# Patient Record
Sex: Male | Born: 1965 | Race: White | Hispanic: No | Marital: Married | Smoking: Former smoker
Health system: Southern US, Community
[De-identification: ages and names within clinical notes are randomized; demographics above are authoritative.]

## PROBLEM LIST (undated history)

## (undated) DIAGNOSIS — I10 Essential (primary) hypertension: Secondary | ICD-10-CM

## (undated) DIAGNOSIS — L4050 Arthropathic psoriasis, unspecified: Secondary | ICD-10-CM

## (undated) DIAGNOSIS — C801 Malignant (primary) neoplasm, unspecified: Secondary | ICD-10-CM

## (undated) DIAGNOSIS — I219 Acute myocardial infarction, unspecified: Secondary | ICD-10-CM

## (undated) DIAGNOSIS — E785 Hyperlipidemia, unspecified: Secondary | ICD-10-CM

## (undated) DIAGNOSIS — K219 Gastro-esophageal reflux disease without esophagitis: Secondary | ICD-10-CM

## (undated) HISTORY — PX: LUMBAR FUSION: SHX111

## (undated) HISTORY — PX: CHOLECYSTECTOMY: SHX55

---

## 1998-11-15 ENCOUNTER — Inpatient Hospital Stay (HOSPITAL_COMMUNITY): Admission: EM | Admit: 1998-11-15 | Discharge: 1998-11-18 | Payer: Self-pay | Admitting: Neurosurgery

## 1998-11-15 ENCOUNTER — Encounter: Payer: Self-pay | Admitting: Neurosurgery

## 1998-11-16 ENCOUNTER — Encounter: Payer: Self-pay | Admitting: Neurosurgery

## 2001-01-31 HISTORY — PX: BACK SURGERY: SHX140

## 2003-06-30 ENCOUNTER — Ambulatory Visit (HOSPITAL_COMMUNITY): Admission: RE | Admit: 2003-06-30 | Discharge: 2003-06-30 | Payer: Self-pay | Admitting: Family Medicine

## 2003-07-21 ENCOUNTER — Ambulatory Visit (HOSPITAL_COMMUNITY): Admission: RE | Admit: 2003-07-21 | Discharge: 2003-07-21 | Payer: Self-pay | Admitting: Dermatology

## 2004-04-07 ENCOUNTER — Ambulatory Visit (HOSPITAL_COMMUNITY): Admission: RE | Admit: 2004-04-07 | Discharge: 2004-04-07 | Payer: Self-pay | Admitting: Family Medicine

## 2005-01-26 ENCOUNTER — Emergency Department (HOSPITAL_COMMUNITY): Admission: EM | Admit: 2005-01-26 | Discharge: 2005-01-26 | Payer: Self-pay | Admitting: Emergency Medicine

## 2006-03-10 ENCOUNTER — Ambulatory Visit (HOSPITAL_COMMUNITY): Admission: RE | Admit: 2006-03-10 | Discharge: 2006-03-10 | Payer: Self-pay | Admitting: Anesthesiology

## 2006-08-29 ENCOUNTER — Ambulatory Visit (HOSPITAL_COMMUNITY): Admission: RE | Admit: 2006-08-29 | Discharge: 2006-08-29 | Payer: Self-pay | Admitting: Family Medicine

## 2006-08-31 ENCOUNTER — Encounter (HOSPITAL_COMMUNITY): Admission: RE | Admit: 2006-08-31 | Discharge: 2006-09-30 | Payer: Self-pay | Admitting: Family Medicine

## 2006-10-27 ENCOUNTER — Ambulatory Visit (HOSPITAL_COMMUNITY): Admission: RE | Admit: 2006-10-27 | Discharge: 2006-10-27 | Payer: Self-pay | Admitting: Family Medicine

## 2006-11-07 ENCOUNTER — Ambulatory Visit (HOSPITAL_COMMUNITY): Admission: RE | Admit: 2006-11-07 | Discharge: 2006-11-07 | Payer: Self-pay | Admitting: Family Medicine

## 2007-04-05 ENCOUNTER — Ambulatory Visit (HOSPITAL_COMMUNITY): Admission: RE | Admit: 2007-04-05 | Discharge: 2007-04-05 | Payer: Self-pay | Admitting: Orthopaedic Surgery

## 2007-05-04 ENCOUNTER — Encounter: Admission: RE | Admit: 2007-05-04 | Discharge: 2007-05-04 | Payer: Self-pay | Admitting: Orthopaedic Surgery

## 2008-06-13 ENCOUNTER — Ambulatory Visit (HOSPITAL_COMMUNITY): Admission: RE | Admit: 2008-06-13 | Discharge: 2008-06-13 | Payer: Self-pay | Admitting: Family Medicine

## 2008-07-15 ENCOUNTER — Ambulatory Visit (HOSPITAL_COMMUNITY): Admission: RE | Admit: 2008-07-15 | Discharge: 2008-07-15 | Payer: Self-pay | Admitting: Family Medicine

## 2008-07-17 ENCOUNTER — Ambulatory Visit: Payer: Self-pay | Admitting: Gastroenterology

## 2008-07-17 ENCOUNTER — Encounter: Payer: Self-pay | Admitting: Gastroenterology

## 2008-07-17 ENCOUNTER — Ambulatory Visit (HOSPITAL_COMMUNITY): Admission: RE | Admit: 2008-07-17 | Discharge: 2008-07-17 | Payer: Self-pay | Admitting: Gastroenterology

## 2008-07-18 ENCOUNTER — Encounter: Payer: Self-pay | Admitting: Gastroenterology

## 2008-07-21 ENCOUNTER — Encounter: Payer: Self-pay | Admitting: Gastroenterology

## 2008-07-30 ENCOUNTER — Encounter (INDEPENDENT_AMBULATORY_CARE_PROVIDER_SITE_OTHER): Payer: Self-pay | Admitting: *Deleted

## 2008-10-07 ENCOUNTER — Ambulatory Visit (HOSPITAL_COMMUNITY): Admission: RE | Admit: 2008-10-07 | Discharge: 2008-10-07 | Payer: Self-pay | Admitting: Orthopaedic Surgery

## 2009-03-26 ENCOUNTER — Encounter: Payer: Self-pay | Admitting: Gastroenterology

## 2009-04-14 ENCOUNTER — Ambulatory Visit (HOSPITAL_COMMUNITY): Admission: RE | Admit: 2009-04-14 | Discharge: 2009-04-14 | Payer: Self-pay | Admitting: Family Medicine

## 2009-04-21 ENCOUNTER — Encounter (HOSPITAL_COMMUNITY): Admission: RE | Admit: 2009-04-21 | Discharge: 2009-05-21 | Payer: Self-pay | Admitting: Family Medicine

## 2009-05-27 ENCOUNTER — Ambulatory Visit: Payer: Self-pay | Admitting: Gastroenterology

## 2009-05-27 DIAGNOSIS — R1011 Right upper quadrant pain: Secondary | ICD-10-CM | POA: Insufficient documentation

## 2009-05-27 DIAGNOSIS — K219 Gastro-esophageal reflux disease without esophagitis: Secondary | ICD-10-CM

## 2009-08-12 ENCOUNTER — Encounter (INDEPENDENT_AMBULATORY_CARE_PROVIDER_SITE_OTHER): Payer: Self-pay

## 2010-03-04 NOTE — Assessment & Plan Note (Signed)
Summary: RUQ ABD PAIN, GERD   Visit Type:  Initial Consult Referring Provider:  Lilyan Punt Primary Care Provider:  Lilyan Punt, M.D.  Chief Complaint:  abd pain/ abn gb function.  History of Present Illness: Pt with NV RUQ abd pain after eating. Wakes with nausea and made worse 1 hour after eating. No vomiting. Sx: x5-6 mos. Sharp, and achy. Uncomfortable all tghe time but increases in intensity 1 hour after eating.No radiation. No BC, , Ibuprofen, Motrin, or Aleve, or Goodys. ASA every AM. Nexium 1-2x/day. Since Mar increased to two times a day and makes a difference sometimes. Before having RUQ pain, reflux controlled 100% of the time. Had grilled chicken, egg muffin for breakfast, ham and corn and peas. Drinks Diet caffeine free Mt Dew. FEELS PAIN EVERY DAY. No problems swallowing.  Preventive Screening-Counseling & Management  Alcohol-Tobacco     Smoking Status: current      Drug Use:  no.    Current Medications (verified): 1)  Prinzide 10-12.5 Mg Tabs (Lisinopril-Hydrochlorothiazide) .... Take 1 Tablet By Mouth Once A Day 2)  Pravachol 40 Mg Tabs (Pravastatin Sodium) .... Take 1 Tablet By Mouth Once A Day 3)  Niacin 500 Mg .... Take 1 Tablet By Mouth Once A Day 4)  Allopurinol 100 Mg Tabs (Allopurinol) .... Take 1 Tablet By Mouth Once A Day 5)  Nexium 40 Mg Cpdr (Esomeprazole Magnesium) .... Take 1 Tablet By Mouth Two Times A Day  Allergies (verified): 1)  ! Levaquin 2)  ! Augmentin  Past History:  Past Medical History: GERD Hyperlipidemia Hypertension Gout 2010 egd: benign gastric mucosa  Past Surgical History: R thumb Back surgery: 2 rods, 4 screws 2o to DDD  Family History: No FH of Colon Cancer or polyps  Social History: Occupation: Production designer, theatre/television/film for [employer] Patient currently smokes, 1pk/day. Married x 17 ys, 2 kids-youngest 12. Alcohol Use - no Illicit Drug Use - no Smoking Status:  current Drug Use:  no  Review of Systems       Per HPI otherwise  all systems negative.  Vital Signs:  Patient profile:   45 year old male Height:      73 inches Weight:      200 pounds BMI:     26.48 Temp:     98.3 degrees F oral Pulse rate:   64 / minute BP sitting:   138 / 88  (left arm) Cuff size:   regular  Vitals Entered By: Cloria Spring LPN (May 27, 2009 8:40 AM)  Physical Exam  General:  Well developed, well nourished, no acute distress. Head:  Normocephalic and atraumatic. Eyes:  PERRLA, no icterus. Mouth:  No deformity or lesions. Neck:  Supple; no masses. Lungs:  Clear throughout to auscultation. Heart:  Regular rate and rhythm; no murmurs. Abdomen:  Pos Carnett's sign with lifting arms and head off the bed. mod TTP in RUQ, without guarding, and without rebound.  normal bowel sounds, thin. Msk:  Symmetrical with no gross deformities. Normal posture. Extremities:  No edema or deformities noted. Neurologic:  Alert and  oriented x4;  grossly normal neurologically.  Impression & Recommendations:  Problem # 1:  ABDOMINAL PAIN, RIGHT UPPER QUADRANT (ICD-789.01) Assessment New  Most likely 2o to chronic cholecystitis. HIDA shows GB EF 17%. Refer to Gen Surg: Jenkins/Ziegler ASAP. OPV in 3 mos.  Orders: Est. Patient Level V ([address])  Problem # 2:  GERD (ICD-530.81) Assessment: Unchanged Sx fairly well controlled. Continue Nexium 1-2 times a day. Will reassesswith  next visit.  CC: PCP  Appended Document: RUQ ABD PAIN, GERD MAR 2011: NL HFP AMYLASE 64 LIPASE 33 HB 15.1 PLT 258 CR 1.02 ABD U/S-NO GALLSTONES  Appended Document: RUQ ABD PAIN, GERD reminder in computer

## 2010-03-04 NOTE — Letter (Signed)
Summary: Recall Office Visit  [facility]  [address]   [place], [place] [address]   Phone: [phone]  Fax: [phone]      August 12, 2009   MAVERIK FOOT [address] [place], [place]  [address] [birth year]   Dear Mr. HANNOLD,   According to our records, it is time for you to schedule a follow-up office visit with [place].   At your convenience, please call [phone] to schedule an office visit. If you have any questions, concerns, or feel that this letter is in error, we would appreciate your call.   Sincerely,    Hendricks Limes LPN  [facility] Ph: [phone]   Fax: [phone]  Appended Document: Recall Office Visit DR. STEVEN [id number]     LAP CHOLY W/ IOC

## 2010-03-04 NOTE — Letter (Signed)
Summary: SURGICAL REFERRAL  SURGICAL REFERRAL   Imported By: Ave Filter 05/27/2009 09:40:54  _____________________________________________________________________  External Attachment:    Type:   Image     Comment:   External Document

## 2010-03-04 NOTE — Letter (Signed)
Summary: single site laparoscopic cholecystectomy follow up  single site laparoscopic cholecystectomy follow up   Imported By: Curtis Sites 07/16/2009 10:56:41  _____________________________________________________________________  External Attachment:    Type:   Image     Comment:   External Document

## 2010-06-15 NOTE — Op Note (Signed)
NAME:  Brian Brewer, Brian Brewer                ACCOUNT NO.:  [id number]   MEDICAL RECORD NO.:  [id number]          PATIENT TYPE:  AMB   LOCATION:  DAY                           FACILITY:  [facility]   PHYSICIAN:  Kassie Mends, M.D.      DATE OF BIRTH:  13-Jul-1965   DATE OF PROCEDURE:  07/17/2008  DATE OF DISCHARGE:  07/17/2008                               OPERATIVE REPORT   REFERRING PHYSICIAN:  Scott A. Gerda Diss, MD   PROCEDURE:  Esophagogastroduodenoscopy with cold forceps biopsy.   INDICATION FOR EXAM:  Brian Brewer is a 45 year old smoker who presents  with new-onset dyspepsia.  He denies using aspirin, BC, Goody powders,  ibuprofen, Motrin, or Aleve.  He uses hydrocodone as needed for pain.  He also takes Nexium daily.  He denies any uncontrolled reflux, nausea,  vomiting, constipation, or diarrhea.  He has had no black stool or blood  in stool or hematemesis.  He has no problems with swallowing.  He has an  intentional 6-pound weight loss.   FINDINGS:  1. Normal esophagus without evidence of Barrett mass, erosion,      ulceration, or stricture.  2. Patchy erythema and edema in the body and the antrum with      occasional erosion.  Biopsies obtained via cold forceps to evaluate      for Helicobacter pylori gastritis.  3. Normal duodenal bulb and second portion of the duodenum.   DIAGNOSES:  Dyspepsia likely secondary to gastritis, biopsies pending.   RECOMMENDATIONS:  1. No aspirin or NSAIDs for 30 days.  No anticoagulation for 7 days.  2. Follow up in 1 month visit with Dr. Cira Servant.  Will reassess his      abdominal pain.  3. Will call him with the results of his biopsies.  4. He may resume his previous diet and avoid gastric irritants.  He is      given a handout on gastric irritants and gastritis.  5. If the patient does not have H. pylori gastritis, then he will need      a CT scan of the abdomen and pelvis to reevaluate his abdominal      pain.  The ultrasound did not provide an adequate  view of his      pancreas.   MEDICATIONS:  1. Demerol 100 mg IV.  2. Versed 4 mg IV.  3. Phenergan 25 mg IV.   PROCEDURE TECHNIQUE:  Physical exam was performed.  Informed consent was  obtained from the patient after explaining benefits, risks, and  alternatives to the procedure.  The patient was connected to the monitor  and placed in left lateral position.  Continuous oxygen was provided by  nasal cannula and IV medicine administered through an indwelling  cannula.  After administration of sedation, the patient's esophagus was  intubated, and the scope advanced under direct visualization to the  second portion of the duodenum.  Scope was removed slowly by carefully  examining the color, texture, anatomy, and integrity of the mucosa on  the way out.  The patient was recovered in endoscopy and discharged home  in satisfactory condition.   PATH:  [facility]. Needs CT ABD pancreatic protocol      Kassie Mends, M.D.  Electronically Signed     SM/MEDQ  D:  07/17/2008  T:  07/18/2008  Job:  [id number]   cc:   Lorin Picket A. Gerda Diss, MD  Fax: [phone]

## 2010-06-15 NOTE — Procedures (Signed)
NAME:  Brian Brewer, Brian Brewer NO.:  [id number]   MEDICAL RECORD NO.:  [id number]          PATIENT TYPE:  REC   LOCATION:                                FACILITY:  [facility]   PHYSICIAN:  Scott A. Gerda Diss, MD    DATE OF BIRTH:  1965/06/06   DATE OF PROCEDURE:  08/31/2006  DATE OF DISCHARGE:                                  STRESS TEST   TEST:  Stress Myoview.   INDICATION:  Chest pain, hypertension, smoker.   BRIEF HISTORY:  The patient has been having elevated blood pressure the  past few weeks along with some intermittent chest pain and discomfort in  substernal to upper epigastrium area. He is on Nexium for reflux and he  does not think it feels like reflux.   Resting EKG:  There is no acute ST segment changes noted.  Normal sinus  rhythm.   HEART RATE RESPONSE TO EXERCISE:  The patient's heart rate gradually  went up and hit in his target around about 9 to 10 minutes and we pushed  to 11 minutes 30 seconds with peak heart rate of 163.   ST SEGMENT RESPONSE TO EXERCISE:  The patient did not have any  appreciable ST segment depressions to exercise.   ARRHYTHMIAS:  None.   BLOOD PRESSURE RESPONSE TO EXERCISE:  Blood pressure went up moderately  during exercise. It went up significantly in the post exercise days.  The patient developed mild headache, but then as the blood pressure came  down his headache started going away.   RECOVERY PHASE:  Uneventful.   SYMPTOMATOLOGY:  No chest pressure or tightness during the exercise,  just a little bit of shortness of breath.   Await interpretation of the Myoview, but by and large this is felt to be  a negative test with low risk of cardiac disease.  She has been  counseled regarding the vast importance of getting his risk factors in  order.  We are awaiting his lab work which was recently done.  He knows  that he needs to quit smoking and he is to increase his Lisinopril to 10  mg a day. We will be following up the results  of this test in the near  future.      Scott A. Gerda Diss, MD  Electronically Signed     SAL/MEDQ  D:  08/31/2006  T:  08/31/2006  Job:  [id number]

## 2010-06-18 NOTE — Procedures (Signed)
NAME:  KADIEN, LINEMAN NO.:  [id number]   MEDICAL RECORD NO.:  [id number]          PATIENT TYPE:  OUT   LOCATION:  DFTL                          FACILITY:  [facility]   PHYSICIAN:  Scott A. Gerda Diss, MD    DATE OF BIRTH:  1965/06/19   DATE OF PROCEDURE:  04/07/2004  DATE OF DISCHARGE:                                    STRESS TEST   CHIEF COMPLAINT:  Chest discomfort.   PROTOCOL:  Bruce protocol.   RESTING EKG:  No acute ST segment changes are noted. No arrhythmias.   HEART RESPONSE TO EXERCISE:  The patient had a gradual increase in heart  rate, reached a peak of 165 in stage 4.   REASON FOR STOPPING TEST:  Achieved a heart rate goal as well as mild  dyspnea, but there was no chest discomfort.   ARRHYTHMIAS:  Had two episodes of single beat PVCs but no other problems.   ST SEGMENT RESPONSE TO EXERCISE:  There was no acute ST segment depressions  noted with exercise. The patient tolerated that well.   BLOOD PRESSURE RESPONSE:  The patient had a mild hypertensive response to  exercise and post exercise phase.   RECOVERY PHASE:  Uneventful.   INTERPRETATION:  Normal stress test with mild hypertensive response. The  patient was encouraged to embark on a regular exercise program as well he is  in the process of quitting smoking.      SAL/MEDQ  D:  04/07/2004  T:  04/07/2004  Job:  [id number]

## 2010-09-16 ENCOUNTER — Ambulatory Visit (HOSPITAL_COMMUNITY)
Admission: RE | Admit: 2010-09-16 | Discharge: 2010-09-16 | Disposition: A | Discharge status: Home / Self Care | Payer: Commercial Managed Care - PPO | Source: Ambulatory Visit | Attending: Podiatry | Admitting: Podiatry

## 2010-09-16 ENCOUNTER — Other Ambulatory Visit: Payer: Self-pay

## 2010-09-16 ENCOUNTER — Encounter (HOSPITAL_COMMUNITY)
Admission: RE | Admit: 2010-09-16 | Discharge: 2010-09-16 | Disposition: A | Discharge status: Home / Self Care | Payer: Commercial Managed Care - PPO | Source: Ambulatory Visit | Attending: Podiatry | Admitting: Podiatry

## 2010-09-16 ENCOUNTER — Encounter (HOSPITAL_COMMUNITY): Payer: Self-pay

## 2010-09-16 HISTORY — DX: Essential (primary) hypertension: I10

## 2010-09-16 HISTORY — DX: Hyperlipidemia, unspecified: E78.5

## 2010-09-16 HISTORY — DX: Gastro-esophageal reflux disease without esophagitis: K21.9

## 2010-09-16 LAB — CBC
MCV: 86.3 fL (ref 78.0–100.0)
Platelets: 223 10*3/uL (ref 150–400)
RBC: 5.25 MIL/uL (ref 4.22–5.81)
WBC: 10.6 10*3/uL — ABNORMAL HIGH (ref 4.0–10.5)

## 2010-09-16 LAB — BASIC METABOLIC PANEL
CO2: 24 mEq/L (ref 19–32)
Chloride: 101 mEq/L (ref 96–112)
Creatinine, Ser: 1 mg/dL (ref 0.50–1.35)
GFR calc Af Amer: >60 mL/min (ref >60)
Potassium: 3.9 mEq/L (ref 3.5–5.1)

## 2010-09-16 LAB — SURGICAL PCR SCREEN
MRSA, PCR: NEGATIVE
Staphylococcus aureus: NEGATIVE

## 2010-09-16 MED ORDER — CLINDAMYCIN PHOSPHATE 600 MG/50ML IV SOLN: 600.0000 mg | Freq: Once | INTRAVENOUS | Status: DC

## 2010-09-16 MED ORDER — LACTATED RINGERS IV SOLN: INTRAVENOUS | Status: DC

## 2010-09-16 NOTE — Patient Instructions (Addendum)
21 Brian Brewer  09/16/2010   Your procedure is scheduled on:  09-23-10  Report to [facility] at 700  AM.  Call this number if you have problems the morning of surgery: [phone]   Remember:   Do not eat food:After Midnight.  Do not drink clear liquids: After Midnight.  Take these medicines the morning of surgery with A SIP OF WATER: lisinopril   Do not wear jewelry, make-up or nail polish.  Do not wear lotions, powders, or perfumes. You may wear deodorant.  Do not shave 48 hours prior to surgery.  Do not bring valuables to the hospital.  Contacts, dentures or bridgework may not be worn into surgery.  Leave suitcase in the car. After surgery it may be brought to your room.  For patients admitted to the hospital, checkout time is 11:00 AM the day of discharge.   Patients discharged the day of surgery will not be allowed to drive home.  Name and phone number of your driver: family  Special Instructions: CHG Shower Use Special Wash: 1/2 bottle night before surgery and 1/2 bottle morning of surgery.   Please read over the following fact sheets that you were given: Pain Booklet, MRSA Information, Surgical Site Infection Prevention, Anesthesia Post-op Instructions and Care and Recovery After Surgery PATIENT INSTRUCTIONS POST-ANESTHESIA  IMMEDIATELY FOLLOWING SURGERY:  Do not drive or operate machinery for the first twenty four hours after surgery.  Do not make any important decisions for twenty four hours after surgery or while taking narcotic pain medications or sedatives.  If you develop intractable nausea and vomiting or a severe headache please notify your doctor immediately.  FOLLOW-UP:  Please make an appointment with your surgeon as instructed. You do not need to follow up with anesthesia unless specifically instructed to do so.  WOUND CARE INSTRUCTIONS (if applicable):  Keep a dry clean dressing on the anesthesia/puncture wound site if there is drainage.  Once the wound has quit  draining you may leave it open to air.  Generally you should leave the bandage intact for twenty four hours unless there is drainage.  If the epidural site drains for more than 36-48 hours please call the anesthesia department.  QUESTIONS?:  Please feel free to call your physician or the hospital operator if you have any questions, and they will be happy to assist you.     [facility] Anesthesia Department [address] [place] [phone]

## 2010-09-23 ENCOUNTER — Ambulatory Visit (HOSPITAL_COMMUNITY): Payer: Commercial Managed Care - PPO | Admitting: Anesthesiology

## 2010-09-23 ENCOUNTER — Ambulatory Visit (HOSPITAL_COMMUNITY)
Admission: RE | Admit: 2010-09-23 | Discharge: 2010-09-23 | Disposition: A | Discharge status: Home / Self Care | Payer: Commercial Managed Care - PPO | Source: Ambulatory Visit | Attending: Podiatry | Admitting: Podiatry

## 2010-09-23 ENCOUNTER — Encounter (HOSPITAL_COMMUNITY): Payer: Self-pay | Admitting: *Deleted

## 2010-09-23 ENCOUNTER — Ambulatory Visit (HOSPITAL_COMMUNITY): Payer: Commercial Managed Care - PPO

## 2010-09-23 ENCOUNTER — Encounter (HOSPITAL_COMMUNITY): Payer: Self-pay | Admitting: Anesthesiology

## 2010-09-23 ENCOUNTER — Encounter (HOSPITAL_COMMUNITY)
Admission: RE | Disposition: A | Discharge status: Home / Self Care | Payer: Self-pay | Source: Ambulatory Visit | Attending: Podiatry

## 2010-09-23 DIAGNOSIS — Z7982 Long term (current) use of aspirin: Secondary | ICD-10-CM | POA: Insufficient documentation

## 2010-09-23 DIAGNOSIS — K219 Gastro-esophageal reflux disease without esophagitis: Secondary | ICD-10-CM

## 2010-09-23 DIAGNOSIS — E785 Hyperlipidemia, unspecified: Secondary | ICD-10-CM | POA: Insufficient documentation

## 2010-09-23 DIAGNOSIS — I10 Essential (primary) hypertension: Secondary | ICD-10-CM | POA: Insufficient documentation

## 2010-09-23 DIAGNOSIS — Z79899 Other long term (current) drug therapy: Secondary | ICD-10-CM | POA: Insufficient documentation

## 2010-09-23 DIAGNOSIS — Z01812 Encounter for preprocedural laboratory examination: Secondary | ICD-10-CM | POA: Insufficient documentation

## 2010-09-23 DIAGNOSIS — L4050 Arthropathic psoriasis, unspecified: Secondary | ICD-10-CM

## 2010-09-23 DIAGNOSIS — Z01810 Encounter for preprocedural cardiovascular examination: Secondary | ICD-10-CM | POA: Insufficient documentation

## 2010-09-23 SURGERY — FUSION, PIP JOINT
Anesthesia: Monitor Anesthesia Care | Site: Foot | Laterality: Right | Wound class: Clean

## 2010-09-23 MED ORDER — HYDROXYZINE HCL 25 MG PO TABS
25.0000 mg | ORAL_TABLET | ORAL | Status: DC | PRN
  Filled 2010-09-23: qty 1

## 2010-09-23 MED ORDER — CLINDAMYCIN PHOSPHATE 300 MG/2ML IJ SOLN: 600.0000 mg | INTRAMUSCULAR | Status: DC

## 2010-09-23 MED ORDER — SODIUM CHLORIDE 0.9 % IR SOLN
Status: DC | PRN
  Administered 2010-09-23: 1000 mL

## 2010-09-23 MED ORDER — HYDROXYZINE PAMOATE 25 MG PO CAPS: 25.0000 mg | ORAL_CAPSULE | ORAL | Status: DC | PRN

## 2010-09-23 MED ORDER — LIDOCAINE HCL (PF) 2 % IJ SOLN
INTRAMUSCULAR | Status: DC | PRN
  Administered 2010-09-23: 5 mL

## 2010-09-23 MED ORDER — LACTATED RINGERS IV SOLN: INTRAVENOUS | Status: DC

## 2010-09-23 MED ORDER — CLINDAMYCIN PHOSPHATE 600 MG/50ML IV SOLN
600.0000 mg | Freq: Once | INTRAVENOUS | Status: AC
  Administered 2010-09-23: 600 mg via INTRAVENOUS

## 2010-09-23 MED ORDER — OXYCODONE-ACETAMINOPHEN 5-325 MG PO TABS: 1.0000 | ORAL_TABLET | ORAL | Status: AC | PRN

## 2010-09-23 MED ORDER — MIDAZOLAM HCL 2 MG/2ML IJ SOLN
1.0000 mg | INTRAMUSCULAR | Status: DC | PRN
  Administered 2010-09-23: 2 mg via INTRAVENOUS

## 2010-09-23 MED ORDER — BUPIVACAINE HCL 0.5 % IJ SOLN
INTRAMUSCULAR | Status: DC | PRN
  Administered 2010-09-23: 20 mL

## 2010-09-23 MED ORDER — OXYCODONE-ACETAMINOPHEN 5-325 MG PO TABS: 1.0000 | ORAL_TABLET | ORAL | Status: DC | PRN

## 2010-09-23 MED ORDER — HYDROXYZINE HCL 25 MG PO TABS: 25.0000 mg | ORAL_TABLET | ORAL | Status: AC | PRN

## 2010-09-23 MED ORDER — ONDANSETRON HCL 4 MG/2ML IJ SOLN
4.0000 mg | Freq: Once | INTRAMUSCULAR | Status: AC
  Administered 2010-09-23: 4 mg via INTRAVENOUS

## 2010-09-23 MED ORDER — PROPOFOL 10 MG/ML IV EMUL
INTRAVENOUS | Status: DC | PRN
  Administered 2010-09-23: 150 ug/kg/min via INTRAVENOUS
  Administered 2010-09-23: 25 ug/kg/min via INTRAVENOUS

## 2010-09-23 MED ORDER — MIDAZOLAM HCL 5 MG/5ML IJ SOLN
INTRAMUSCULAR | Status: DC | PRN
  Administered 2010-09-23 (×2): 1 mg via INTRAVENOUS

## 2010-09-23 MED ORDER — HYDROXYZINE PAMOATE 25 MG PO CAPS
25.0000 mg | ORAL_CAPSULE | ORAL | Status: DC | PRN
  Filled 2010-09-23: qty 1

## 2010-09-23 MED ORDER — LACTATED RINGERS IV SOLN
INTRAVENOUS | Status: DC
  Administered 2010-09-23: 08:00:00 via INTRAVENOUS
  Administered 2010-09-23: 50 mL/h via INTRAVENOUS

## 2010-09-23 MED ORDER — FENTANYL CITRATE 0.05 MG/ML IJ SOLN
INTRAMUSCULAR | Status: DC | PRN
  Administered 2010-09-23 (×2): 50 ug via INTRAVENOUS

## 2010-09-23 MED ORDER — LIDOCAINE HCL 1 % IJ SOLN
INTRAMUSCULAR | Status: DC | PRN
  Administered 2010-09-23: 10 mg via INTRADERMAL

## 2010-09-23 SURGICAL SUPPLY — 65 items
APL SKNCLS STERI-STRIP NONHPOA (GAUZE/BANDAGES/DRESSINGS) ×1
BAG HAMPER (MISCELLANEOUS) ×2 IMPLANT
BANDAGE ELASTIC 4 VELCRO NS (GAUZE/BANDAGES/DRESSINGS) ×2 IMPLANT
BANDAGE ELASTIC 6 VELCRO NS (GAUZE/BANDAGES/DRESSINGS) ×2 IMPLANT
BANDAGE ESMARK 4X12 BL STRL LF (DISPOSABLE) ×1 IMPLANT
BANDAGE GAUZE ELAST BULKY 4 IN (GAUZE/BANDAGES/DRESSINGS) ×2 IMPLANT
BENZOIN TINCTURE PRP APPL 2/3 (GAUZE/BANDAGES/DRESSINGS) ×2 IMPLANT
BIT DRILL 2.0 HCS 150 (BIT) ×1 IMPLANT
BLADE OSC/SAGITTAL MD 5.5X18 (BLADE) ×2 IMPLANT
BLADE SURG 15 STRL LF DISP TIS (BLADE) ×2 IMPLANT
BLADE SURG 15 STRL SS (BLADE) ×4
BNDG CMPR 12X4 ELC STRL LF (DISPOSABLE) ×1
BNDG ESMARK 4X12 BLUE STRL LF (DISPOSABLE) ×2
CAP PIN PROTECTOR ORTHO WHT (CAP) IMPLANT
CHLORAPREP W/TINT 26ML (MISCELLANEOUS) ×3 IMPLANT
CLOTH BEACON ORANGE TIMEOUT ST (SAFETY) ×2 IMPLANT
COVER LIGHT HANDLE STERIS (MISCELLANEOUS) ×4 IMPLANT
CUFF TOURNIQUET SINGLE 18IN (TOURNIQUET CUFF) ×1 IMPLANT
CUFF TOURNIQUET SINGLE 24IN (TOURNIQUET CUFF) ×1 IMPLANT
CUFF TOURNIQUET SINGLE 34IN LL (TOURNIQUET CUFF) ×1 IMPLANT
DECANTER SPIKE VIAL GLASS SM (MISCELLANEOUS) ×2 IMPLANT
DRAIN TLS ROUND 10FR (DRAIN) IMPLANT
DRAPE OEC MINIVIEW 54X84 (DRAPES) ×2 IMPLANT
DRILL BIT ALLOFIX 2.0MM (BIT) ×1 IMPLANT
DRSG ADAPTIC 3X8 NADH LF (GAUZE/BANDAGES/DRESSINGS) ×2 IMPLANT
DURA STEPPER LG (CAST SUPPLIES) IMPLANT
DURA STEPPER MED (CAST SUPPLIES) ×1 IMPLANT
DURA STEPPER SML (CAST SUPPLIES) IMPLANT
DURA STEPPER XL (SOFTGOODS) IMPLANT
ELECT REM PT RETURN 9FT ADLT (ELECTROSURGICAL) ×2
ELECTRODE REM PT RTRN 9FT ADLT (ELECTROSURGICAL) ×1 IMPLANT
FORMALIN 10 PREFIL 480ML (MISCELLANEOUS) ×1 IMPLANT
GLOVE BIO SURGEON STRL SZ7.5 (GLOVE) ×2 IMPLANT
GLOVE BIOGEL PI IND STRL 7.0 (GLOVE) IMPLANT
GLOVE BIOGEL PI INDICATOR 7.0 (GLOVE) ×2
GLOVE ECLIPSE 6.5 STRL STRAW (GLOVE) ×1 IMPLANT
GLOVE ECLIPSE 7.0 STRL STRAW (GLOVE) ×1 IMPLANT
GOWN BRE IMP SLV AUR XL STRL (GOWN DISPOSABLE) ×5 IMPLANT
GUIDEWIRE THREADED 1.1X150MM (WIRE) ×1 IMPLANT
K-WIRE 229MX1.6 (WIRE) ×1 IMPLANT
K-WIRE 6 (WIRE)
KIT ROOM TURNOVER AP CYSTO (KITS) ×2 IMPLANT
KWIRE 6 (WIRE) IMPLANT
MANIFOLD NEPTUNE II (INSTRUMENTS) ×2 IMPLANT
MANIFOLD NEPTUNE WASTE (CANNULA) ×2 IMPLANT
NDL HYPO 18GX1.5 BLUNT FILL (NEEDLE) IMPLANT
NDL HYPO 27GX1-1/4 (NEEDLE) ×1 IMPLANT
NEEDLE HYPO 18GX1.5 BLUNT FILL (NEEDLE) ×2 IMPLANT
NEEDLE HYPO 27GX1-1/4 (NEEDLE) ×6 IMPLANT
NS IRRIG 1000ML POUR BTL (IV SOLUTION) ×2 IMPLANT
PACK BASIC LIMB (CUSTOM PROCEDURE TRAY) ×2 IMPLANT
PAD ARMBOARD 7.5X6 YLW CONV (MISCELLANEOUS) ×2 IMPLANT
PIN CAPS ORTHO GREEN .062 (PIN) ×1 IMPLANT
RASP SM TEAR CROSS CUT (RASP) ×1 IMPLANT
SCREW HEADLESS SHT THD 3.0X34 (Screw) ×1 IMPLANT
SET BASIN LINEN APH (SET/KITS/TRAYS/PACK) ×2 IMPLANT
SPONGE GAUZE 4X4 12PLY (GAUZE/BANDAGES/DRESSINGS) ×2 IMPLANT
STRIP CLOSURE SKIN 1/2X4 (GAUZE/BANDAGES/DRESSINGS) ×2 IMPLANT
SUT MON AB 5-0 PS2 18 (SUTURE) ×3 IMPLANT
SUT VIC AB 3-0 SH 27 (SUTURE) ×2
SUT VIC AB 3-0 SH 27X BRD (SUTURE) IMPLANT
SUT VIC AB 4-0 PS2 27 (SUTURE) ×3 IMPLANT
SUT VICRYL AB 3-0 FS1 BRD 27IN (SUTURE) IMPLANT
SYR CONTROL 10ML LL (SYRINGE) ×6 IMPLANT
WATER STERILE IRR 1000ML POUR (IV SOLUTION) ×2 IMPLANT

## 2010-09-23 NOTE — Anesthesia Postprocedure Evaluation (Signed)
Anesthesia Post Note  Patient: Brian Brewer  Procedure(s) Performed:  PROXIMAL INTERPHALANGEAL FUSION (PIP) - Interphalangeal Joint Fusion Right Hallux  Anesthesia type: MAC  Patient location: PACU  Post pain: Pain level controlled  Post assessment: Post-op Vital signs reviewed, Patient's Cardiovascular Status Stable, Respiratory Function Stable, Patent Airway, No signs of Nausea or vomiting and Pain level controlled  Last Vitals:  Filed Vitals:   09/23/10 1055  BP: 128/72  Pulse:   Temp: 98 F (36.7 C)  Resp: 12    Post vital signs: Reviewed and stable  Level of consciousness: awake and alert   Complications: No apparent anesthesia complications

## 2010-09-23 NOTE — Anesthesia Procedure Notes (Addendum)
Procedure Name: MAC Date/Time: 09/23/2010 9:05 AM Performed by: Minerva Areola Pre-anesthesia Checklist: Patient identified, Patient being monitored, Emergency Drugs available, Timeout performed and Suction available Patient Re-evaluated:Patient Re-evaluated prior to inductionOxygen Delivery Method: Nasal Cannula and Simple face mask

## 2010-09-23 NOTE — Anesthesia Preprocedure Evaluation (Addendum)
Anesthesia Evaluation  Name, MR# and DOB Patient awake  General Assessment Comment  Reviewed: Allergy & Precautions, H&P , NPO status   History of Anesthesia Complications Negative for: history of anesthetic complications  Airway       Dental   Pulmonary      Cardiovascular hypertension, Pt. on medications     Neuro/Psych   GI/Hepatic/Renal        GERD Medicated and Controlled     Endo/Other    Abdominal   Musculoskeletal   Hematology   Peds  Reproductive/Obstetrics    Anesthesia Other Findings             Anesthesia Physical Anesthesia Plan  ASA: II  Anesthesia Plan: MAC   Post-op Pain Management:    Induction: Intravenous  Airway Management Planned: Nasal Cannula  Additional Equipment:   Intra-op Plan:   Post-operative Plan:   Informed Consent: I have reviewed the patients History and Physical, chart, labs and discussed the procedure including the risks, benefits and alternatives for the proposed anesthesia with the patient or authorized representative who has indicated his/her understanding and acceptance.     Plan Discussed with:   Anesthesia Plan Comments:         Anesthesia Quick Evaluation

## 2010-09-23 NOTE — H&P (Signed)
History and Physical Interval Note:   09/23/2010   8:22 AM   Brian Brewer  has presented today for surgery, with the diagnosis of psonatic arthritis, pain right hallux  The various methods of treatment have been discussed with the patient and family. After consideration of risks, benefits and other options for treatment, the patient has consented to  Procedure(s): INTERPHALANGEAL FUSION (IPJ) of the right hallux as a surgical intervention .  I have reviewed the patients' chart and labs.  Questions were answered to the patient's satisfaction.    There has been no interval change since his H&P was performed 09/15/2010.   Dallas Schimke  MD

## 2010-09-23 NOTE — Brief Op Note (Signed)
09/23/2010  10:49 AM  PATIENT:  Brian Brewer  45 y.o. male  PRE-OPERATIVE DIAGNOSIS:  Psoriatic Arthritis  POST-OPERATIVE DIAGNOSIS:  Psoriatic Arthritis  PROCEDURE:  INTERPHALANGEAL JOINT FUSION (IPJ) OF THE RIGHT HALLUX   SURGEON:  Marius Ditch, DPM  ANESTHESIA:   MAC  ESTIMATED BLOOD LOSS: Minimal  DRAINS: None   LOCAL MEDICATIONS USED:  MARCAINE 20 CC and XYLOCAINE 5 CC  SPECIMEN:  No Specimen  DISPOSITION OF SPECIMEN:  N/A  COUNTS:  YES  TOURNIQUET:  Pneumatic Ankle Tourniquet at 250 mmHg  PATIENT DISPOSITION:  Short Stay   Delay start of Pharmacological VTE agent (>24hrs) due to surgical blood loss or risk of bleeding:  not applicable

## 2010-09-23 NOTE — Transfer of Care (Signed)
Immediate Anesthesia Transfer of Care Note  Patient: Brian Brewer  Procedure(s) Performed:  PROXIMAL INTERPHALANGEAL FUSION (PIP) - Interphalangeal Joint Fusion Right Hallux  Patient Location: PACU  Anesthesia Type: MAC  Level of Consciousness: awake  Airway & Oxygen Therapy: Patient Spontanous Breathing. Nasal cannula  Post-op Assessment: Report given to PACU RN, Post -op Vital signs reviewed and stable and Patient moving all extremities  Post vital signs: Reviewed and stable  Complications: No apparent anesthesia complications

## 2010-09-25 NOTE — Op Note (Signed)
OPERATIVE REPORT  SURGEON:   Dallas Schimke, DPM  OR STAFF:   Lizabeth Leyden, RN - Circulator Sherri Sear - Scrub Person Raliegh Scarlet, RN - RN First Assistant Debra Magda Kiel, RN - Relief Circulator   PREOPERATIVE DIAGNOSIS:   Psoriatic arthritis  POSTOPERATIVE DIAGNOSIS: Psoriatic arthritis  PROCEDURE: IPJ fusion of the hallux of the right foot  ANESTHESIA:   Monitor Anesthesia Care   HEMOSTASIS:   Pneumatic Ankle Tourniquet set at 250 mmHg  ESTIMATED BLOOD LOSS:   Minimal  MATERIALS USED:   3.0 mm Synthes cannulated screw and 0.062" k-wire  PATHOLOGY:  None  COMPLICATIONS:  None  DESCRIPTION OF THE PROCEDURE:  The patient was brought to the operating room and placed on the operative table in the supine position.  A pneumatic ankle tourniquet was applied to the patient's ankle.  Following sedation, the surgical site was anesthetized with 0.5% Marcaine plain.  The foot was then prepped, scrubbed, and draped in the usual sterile technique.  The foot was elevated, exsanguinated and the pneumatic ankle tourniquet inflated to 250 mmHg.    Attention was directed to the dorsal aspect of the operative foot where a transverse incision was made overlying the interphalangeal joint of the hallux.  Dissection was continued deep down to the level of the interphalangeal joint of the hallux.  A transverse tenotomy and capsulotomy was performed.  Severe degenerative changes of the head of the proximal phalanx and base of the distal phalanx were noted.  Using a power bone saw the articular surface from the head of the proximal phalanx and base of the distal phalanx were resected and passed from the operative field.  The wound was irrigated with copious amounts of sterile irrigant.  A K wire from the Synthes 3.0 cannulated screw set was inserted through the distal phalanx and allowed to exit the distal aspect of the toe.  The wire was retrograded into the proximal phalanx.   Position of the wire was confirmed under C-arm fluoroscopy.  Following standard techniques, a 3.0 cannulated Synthes screw was inserted across the interphalangeal joint.  Adequate compression was noted.  A 0.062 inch Kirschner wire was then inserted from a dorsal medial to proximal lateral direction to serve as a second point of fixation.  Position of the wire was confirmed with C-arm fluoroscopy.  The surgical wound was then irrigated.  The extensor tendon was reapproximated using Vicryl.  The skin was reapproximated using Monocryl in a subcuticular manner.  The incision was reinforced with nylon and Steri-Strips.  A sterile compressive dressing was applied to the operative foot.  The patient's ankle tourniquet was deflated.  A prompt hyperemic response was noted to all digits of the operative foot.    The patient tolerated the procedure well.  The patient was then transferred to PACU under the care of the anesthesia team with vital signs stable and vascular status intact to all toes of the operative foot.  Following a period of postoperative monitoring, the patient will be discharged home.

## 2010-09-29 ENCOUNTER — Other Ambulatory Visit (HOSPITAL_COMMUNITY): Payer: Self-pay | Admitting: Podiatry

## 2010-11-11 LAB — POCT I-STAT CREATININE
Creatinine, Ser: 0.9
Operator id: 208401

## 2011-10-17 ENCOUNTER — Other Ambulatory Visit: Payer: Self-pay | Admitting: Family Medicine

## 2011-10-17 DIAGNOSIS — R109 Unspecified abdominal pain: Secondary | ICD-10-CM

## 2011-10-21 ENCOUNTER — Ambulatory Visit (HOSPITAL_COMMUNITY)
Admission: RE | Admit: 2011-10-21 | Discharge: 2011-10-21 | Disposition: A | Discharge status: Home / Self Care | Payer: Commercial Managed Care - PPO | Source: Ambulatory Visit | Attending: Family Medicine | Admitting: Family Medicine

## 2011-10-21 DIAGNOSIS — R109 Unspecified abdominal pain: Secondary | ICD-10-CM

## 2011-10-21 DIAGNOSIS — R1013 Epigastric pain: Secondary | ICD-10-CM | POA: Insufficient documentation

## 2012-03-08 ENCOUNTER — Emergency Department (HOSPITAL_COMMUNITY)
Admission: EM | Admit: 2012-03-08 | Discharge: 2012-03-08 | Disposition: A | Discharge status: Nursing Home (Includes ICF) | Payer: Commercial Managed Care - PPO | Source: Home / Self Care

## 2012-03-08 ENCOUNTER — Encounter (HOSPITAL_COMMUNITY): Payer: Self-pay | Admitting: Emergency Medicine

## 2012-03-08 ENCOUNTER — Emergency Department (HOSPITAL_COMMUNITY): Payer: Commercial Managed Care - PPO

## 2012-03-08 ENCOUNTER — Emergency Department (HOSPITAL_COMMUNITY)
Admission: EM | Admit: 2012-03-08 | Discharge: 2012-03-08 | Disposition: A | Discharge status: Home / Self Care | Payer: Commercial Managed Care - PPO | Attending: Emergency Medicine | Admitting: Emergency Medicine

## 2012-03-08 ENCOUNTER — Encounter (HOSPITAL_COMMUNITY): Payer: Self-pay | Admitting: *Deleted

## 2012-03-08 DIAGNOSIS — K219 Gastro-esophageal reflux disease without esophagitis: Secondary | ICD-10-CM | POA: Insufficient documentation

## 2012-03-08 DIAGNOSIS — R079 Chest pain, unspecified: Secondary | ICD-10-CM | POA: Insufficient documentation

## 2012-03-08 DIAGNOSIS — E785 Hyperlipidemia, unspecified: Secondary | ICD-10-CM | POA: Insufficient documentation

## 2012-03-08 DIAGNOSIS — Z720 Tobacco use: Secondary | ICD-10-CM

## 2012-03-08 DIAGNOSIS — Z7982 Long term (current) use of aspirin: Secondary | ICD-10-CM | POA: Insufficient documentation

## 2012-03-08 DIAGNOSIS — F17200 Nicotine dependence, unspecified, uncomplicated: Secondary | ICD-10-CM

## 2012-03-08 DIAGNOSIS — Z79899 Other long term (current) drug therapy: Secondary | ICD-10-CM | POA: Insufficient documentation

## 2012-03-08 DIAGNOSIS — M109 Gout, unspecified: Secondary | ICD-10-CM | POA: Insufficient documentation

## 2012-03-08 DIAGNOSIS — I10 Essential (primary) hypertension: Secondary | ICD-10-CM | POA: Insufficient documentation

## 2012-03-08 LAB — CBC
HCT: 43 % (ref 39.0–52.0)
Hemoglobin: 14.6 g/dL (ref 13.0–17.0)
MCH: 28.8 pg (ref 26.0–34.0)
MCHC: 34 g/dL (ref 30.0–36.0)

## 2012-03-08 LAB — PROTIME-INR: Prothrombin Time: 12 seconds (ref 11.6–15.2)

## 2012-03-08 LAB — COMPREHENSIVE METABOLIC PANEL
Alkaline Phosphatase: 78 U/L (ref 39–117)
BUN: 10 mg/dL (ref 6–23)
CO2: 25 mEq/L (ref 19–32)
Calcium: 9.3 mg/dL (ref 8.4–10.5)
GFR calc Af Amer: >90 mL/min (ref >90)
GFR calc non Af Amer: >90 mL/min (ref >90)
Glucose, Bld: 103 mg/dL — ABNORMAL HIGH (ref 70–99)
Total Protein: 6.6 g/dL (ref 6.0–8.3)

## 2012-03-08 LAB — APTT: aPTT: 39 seconds — ABNORMAL HIGH (ref 24–37)

## 2012-03-08 LAB — POCT I-STAT TROPONIN I: Troponin i, poc: 0.01 ng/mL (ref 0.00–0.08)

## 2012-03-08 MED ORDER — SODIUM CHLORIDE 0.9 % IV SOLN: 1000.0000 mL | INTRAVENOUS | Status: DC

## 2012-03-08 MED ORDER — NITROGLYCERIN 0.4 MG SL SUBL
SUBLINGUAL_TABLET | SUBLINGUAL | Status: AC
  Filled 2012-03-08: qty 25

## 2012-03-08 MED ORDER — SODIUM CHLORIDE 0.9 % IV SOLN
Freq: Once | INTRAVENOUS | Status: AC
  Administered 2012-03-08: 17:00:00 via INTRAVENOUS

## 2012-03-08 MED ORDER — ACETAMINOPHEN 325 MG PO TABS
650.0000 mg | ORAL_TABLET | Freq: Once | ORAL | Status: AC
  Administered 2012-03-08: 650 mg via ORAL
  Filled 2012-03-08: qty 2

## 2012-03-08 MED ORDER — NITROGLYCERIN 0.4 MG SL SUBL
0.4000 mg | SUBLINGUAL_TABLET | SUBLINGUAL | Status: DC | PRN
  Administered 2012-03-08 (×2): 0.4 mg via SUBLINGUAL

## 2012-03-08 MED ORDER — SODIUM CHLORIDE 0.9 % IV SOLN: Freq: Once | INTRAVENOUS | Status: DC

## 2012-03-08 MED ORDER — NITROGLYCERIN 2 % TD OINT
1.0000 [in_us] | TOPICAL_OINTMENT | Freq: Once | TRANSDERMAL | Status: AC
  Administered 2012-03-08: 1 [in_us] via TOPICAL
  Filled 2012-03-08: qty 1

## 2012-03-08 NOTE — ED Notes (Signed)
Pt tx from [facility] for CP. PMH of HTN. Pt family reports pt has had CP in past and had stress test that was negative. Pt given SL nitro x3 prior to ED arrival and ASA 81mg  x2. CP started today around 1100 and is described as constant pressure in left chest.

## 2012-03-08 NOTE — ED Provider Notes (Signed)
History     CSN: [id number]  Arrival date & time 03/08/12  1612   First MD Initiated Contact with Patient 03/08/12 1639      Chief Complaint  Patient presents with  . Chest Pain    (Consider location/radiation/quality/duration/timing/severity/associated sxs/prior treatment) HPI Comments: 47 year old male who stated he developed left anterior chest pressure and tightness approximately 10:00 this morning. It occurs at rest and has been constant through the time in which she presents to the urgent care. Since arrival the discomfort has increased. He denies diaphoresis, shortness of breath or GI symptoms. He states he took his blood pressure home this morning it was 191/130. He has a known history of hypertension treated with lisinopril. Risk Factors include uncontrolled hypertension, tobacco abuse and dyslipidemia.  Patient is a 47 y.o. male presenting with chest pain.  Chest Pain Pertinent negatives for primary symptoms include no fever, no fatigue and no palpitations.  Pertinent negatives for associated symptoms include no diaphoresis.     Past Medical History  Diagnosis Date  . Hypertension   . GERD (gastroesophageal reflux disease)   . Hyperlipemia   . Gout     Past Surgical History  Procedure Date  . Lumbar fusion     3 yrs ago-[place]  . Cholecystectomy     Family History  Problem Relation Age of Onset  . Anesthesia problems Neg Hx   . Hypotension Neg Hx   . Malignant hyperthermia Neg Hx   . Pseudochol deficiency Neg Hx     History  Substance Use Topics  . Smoking status: Current Every Day Smoker -- 1.0 packs/day for 20 years    Types: Cigarettes  . Smokeless tobacco: Not on file  . Alcohol Use: No      Review of Systems  Constitutional: Positive for activity change. Negative for fever, diaphoresis and fatigue.  HENT: Negative.   Respiratory: Negative.   Cardiovascular: Positive for chest pain. Negative for palpitations and leg swelling.   Gastrointestinal: Negative.   Genitourinary: Negative.   Musculoskeletal: Negative.   Skin: Negative.   Neurological: Negative.   Psychiatric/Behavioral: Negative.     Allergies  Amoxicillin-pot clavulanate and Levofloxacin  Home Medications   Current Outpatient Rx  Name  Route  Sig  Dispense  Refill  . ALLOPURINOL PO   Oral   Take by mouth.         Marland Kitchen NIACIN PO   Oral   Take by mouth.         . ASPIRIN EC 81 MG PO TBEC   Oral   Take 81 mg by mouth daily.           Marland Kitchen ESOMEPRAZOLE MAGNESIUM 20 MG PO PACK   Oral   Take 20 mg by mouth daily before breakfast.           . LISINOPRIL-HYDROCHLOROTHIAZIDE 10-12.5 MG PO TABS   Oral   Take 1 tablet by mouth daily.           Marland Kitchen PRAVASTATIN SODIUM 40 MG PO TABS   Oral   Take 40 mg by mouth daily.             BP 150/93  Pulse 80  Temp 98.1 F (36.7 C) (Oral)  Resp 16  SpO2 97%  Physical Exam  Constitutional: He is oriented to person, place, and time. He appears well-developed and well-nourished. No distress.  Eyes: Conjunctivae normal and EOM are normal.  Neck: Normal range of motion. Neck supple.  Cardiovascular:  Normal rate, regular rhythm, normal heart sounds and intact distal pulses.   Pulmonary/Chest: Effort normal and breath sounds normal. No respiratory distress. He has no wheezes. He has no rales.  Abdominal: Soft. There is no tenderness.  Musculoskeletal: He exhibits no edema and no tenderness.  Neurological: He is alert and oriented to person, place, and time. He exhibits normal muscle tone.  Skin: Skin is warm and dry.       Mild facial flushing  Psychiatric: He has a normal mood and affect.    ED Course  Procedures (including critical care time)  Labs Reviewed - No data to display No results found.   1. Chest pain   2. HTN (hypertension)   3. Tobacco abuse       MDM  Patient with history of uncontrolled hypertension presents with heaviness and tightness left anterior chest. Began  about 10 AM and states it is worse now. Additional risk factor is smoking and dyslipidemia. He states his blood pressures at home today was 191/130. EKG: Normal sinus rhythm with less than 1 mm ST elevation in II, III, aVF. Early \\re  pole in V2 and V3. Patient is being transferred for evaluation of chest pain.        Hayden Rasmussen, NP 03/08/12 [phone]

## 2012-03-08 NOTE — ED Provider Notes (Signed)
History    CSN: [id number] Arrival date & time 03/08/12  1740First MD Initiated Contact with Patient 03/08/12 1742      Chief Complaint  Patient presents with  . Chest Pain   Patient is a 47 y.o. male presenting with chest pain. The history is provided by the patient.  Chest Pain The chest pain began 6 - 12 hours ago. Duration of episode(s) is 7 hours. Chest pain occurs constantly. The chest pain is improving. The pain is currently at 1/10. The severity of the pain is moderate. The quality of the pain is described as pressure-like and tightness. The pain does not radiate. Pertinent negatives for primary symptoms include no fever, no fatigue, no syncope, no shortness of breath, no cough, no wheezing, no nausea and no vomiting. He tried nitroglycerin for the symptoms. Risk factors include smoking/tobacco exposure.    patient also has history of hypertension and hyperlipidemia. He was seen at the urgent care this afternoon. He performed an EKG and gave him nitroglycerin. Patient feels like that helped his symptoms and is feeling better than he did earlier. He has no known history of heart disease. There is no significant family of heart disease in immediate family but his grand parents did have heart trouble.  Past Medical History  Diagnosis Date  . Hypertension   . GERD (gastroesophageal reflux disease)   . Hyperlipemia   . Gout     Past Surgical History  Procedure Date  . Lumbar fusion     3 yrs ago-[place]  . Cholecystectomy     Family History  Problem Relation Age of Onset  . Anesthesia problems Neg Hx   . Hypotension Neg Hx   . Malignant hyperthermia Neg Hx   . Pseudochol deficiency Neg Hx     History  Substance Use Topics  . Smoking status: Current Every Day Smoker -- 1.0 packs/day for 20 years    Types: Cigarettes  . Smokeless tobacco: Not on file  . Alcohol Use: No      Review of Systems  Constitutional: Negative for fever and fatigue.  Respiratory: Negative for  cough, shortness of breath and wheezing.   Cardiovascular: Positive for chest pain. Negative for syncope.  Gastrointestinal: Negative for nausea and vomiting.  All other systems reviewed and are negative.    Allergies  Amoxicillin-pot clavulanate and Levofloxacin  Home Medications   Current Outpatient Rx  Name  Route  Sig  Dispense  Refill  . ALLOPURINOL PO   Oral   Take by mouth.         . ASPIRIN EC 81 MG PO TBEC   Oral   Take 81 mg by mouth daily.           Marland Kitchen ESOMEPRAZOLE MAGNESIUM 20 MG PO PACK   Oral   Take 20 mg by mouth daily before breakfast.           . LISINOPRIL-HYDROCHLOROTHIAZIDE 10-12.5 MG PO TABS   Oral   Take 1 tablet by mouth daily.           Marland Kitchen NIACIN PO   Oral   Take by mouth.         Marland Kitchen PRAVASTATIN SODIUM 40 MG PO TABS   Oral   Take 40 mg by mouth daily.             BP 134/76  Pulse 79  Temp 98.1 F (36.7 C) (Oral)  Resp 22  Ht 5\' 10"  (1.778 m)  Wt 210 lb (  95.255 kg)  BMI 30.13 kg/m2  SpO2 96%  Physical Exam  Nursing note and vitals reviewed. Constitutional: He appears well-developed and well-nourished. No distress.  HENT:  Head: Normocephalic and atraumatic.  Right Ear: External ear normal.  Left Ear: External ear normal.  Eyes: Conjunctivae normal are normal. Right eye exhibits no discharge. Left eye exhibits no discharge. No scleral icterus.  Neck: Neck supple. No tracheal deviation present.  Cardiovascular: Normal rate, regular rhythm and intact distal pulses.   Pulmonary/Chest: Effort normal and breath sounds normal. No stridor. No respiratory distress. He has no wheezes. He has no rales.  Abdominal: Soft. Bowel sounds are normal. He exhibits no distension. There is no tenderness. There is no rebound and no guarding.  Musculoskeletal: He exhibits no edema and no tenderness.  Neurological: He is alert. He has normal strength. No sensory deficit. Cranial nerve deficit:  no gross defecits noted. He exhibits normal muscle  tone. He displays no seizure activity. Coordination normal.  Skin: Skin is warm and dry. No rash noted.  Psychiatric: He has a normal mood and affect.    ED Course  Procedures (including critical care time) EKG  Rate: 70  Rhythm: normal sinus rhythm  QRS Axis: normal  Intervals: normal  ST/T Wave abnormalities: normal  Conduction Disutrbances:none  Narrative Interpretation: Q waves in lead 3 and ED F.  Old EKG Reviewed: Q wave is larger when compared to EKG dated 09/16/2010  Labs Reviewed  COMPREHENSIVE METABOLIC PANEL - Abnormal; Notable for the following:    Glucose, Bld 103 (*)     Total Bilirubin 0.2 (*)     All other components within normal limits  APTT - Abnormal; Notable for the following:    aPTT 39 (*)     All other components within normal limits  CBC  PROTIME-INR  POCT I-STAT TROPONIN I  POCT I-STAT TROPONIN I   Dg Chest Portable 1 View  03/08/2012  *RADIOLOGY REPORT*  Clinical Data: Chest pain  PORTABLE CHEST - 1 VIEW  Comparison: 06/13/2008  Findings: Lungs are clear. No pleural effusion or pneumothorax.  The heart is top normal in size.  IMPRESSION: No evidence of acute cardiopulmonary disease.   Original Report Authenticated By: Charline Bills, M.D.      MDM  The patient has cardiac risk factors but no known history of coronary artery disease. His evaluation in the emergency room is unremarkable.    I discussed admission and further evaluation the hospital with the patient. The patient states he does not want to be admitted and wants to be discharged.  He states he has had this chest pain before in the past had a stress test that was normal. He never has seen a cardiologist and have this test performed as an outpatient by his primary Dr.  I discussed the case with our cardiology. They will arrange close outpatient followup with the patient. He also should call him tomorrow. Patient was emergency room for two sets of cardiac enzymes. He has had constant chest  pain at this time for greater than 10 hours. Both sets of enzymes are negative. At this point I think he is low risk and it is reasonable for him to followup closely as an outpatient. Patient is instructed to take an aspirin daily. He is to return to emergency room for any worsening symptoms or other concerns.        Celene Kras, MD 03/08/12 2150

## 2012-03-08 NOTE — ED Notes (Signed)
Head ache better after tylenol

## 2012-03-08 NOTE — ED Notes (Signed)
Iv  Ns  tko  20  Angio  l  Hand 1  Att  Site  Is  Patent

## 2012-03-08 NOTE — ED Provider Notes (Signed)
Medical screening examination/treatment/procedure(s) were performed by non-physician practitioner and as supervising physician I was immediately available for consultation/collaboration.  Leslee Home, M.D.   Reuben Likes, MD 03/08/12 [phone]

## 2012-03-08 NOTE — ED Notes (Signed)
Pt  Reports   A   Pain  Scale   Of  4     l  Sided        Described  As   Someone  Pushing  On  His  Chest             Pain  Started    About 7  Hours  Ago           denys  Any    Shortness  Of  Breath   Also  Reports  Feeling  Tired

## 2012-03-08 NOTE — ED Notes (Signed)
Placed  On  Cardiac  Monitor  Nasal o2  At  2  l / min 

## 2012-03-12 ENCOUNTER — Ambulatory Visit (HOSPITAL_COMMUNITY)
Admission: RE | Admit: 2012-03-12 | Discharge: 2012-03-12 | Disposition: A | Discharge status: Home / Self Care | Payer: Commercial Managed Care - PPO | Source: Ambulatory Visit | Attending: Family Medicine | Admitting: Family Medicine

## 2012-03-12 ENCOUNTER — Other Ambulatory Visit (HOSPITAL_COMMUNITY): Payer: Self-pay | Admitting: Family Medicine

## 2012-03-12 DIAGNOSIS — R079 Chest pain, unspecified: Secondary | ICD-10-CM

## 2012-03-16 ENCOUNTER — Encounter: Payer: Commercial Managed Care - PPO | Admitting: Cardiology

## 2012-03-22 ENCOUNTER — Encounter: Payer: Commercial Managed Care - PPO | Admitting: Cardiovascular Disease

## 2012-03-23 ENCOUNTER — Ambulatory Visit (INDEPENDENT_AMBULATORY_CARE_PROVIDER_SITE_OTHER): Payer: Commercial Managed Care - PPO | Admitting: Internal Medicine

## 2012-03-23 ENCOUNTER — Encounter: Payer: Self-pay | Admitting: Internal Medicine

## 2012-03-23 ENCOUNTER — Ambulatory Visit: Payer: Commercial Managed Care - PPO | Admitting: Internal Medicine

## 2012-03-23 VITALS — BP 143/85 | HR 83 | Ht 70.0 in | Wt 204.0 lb

## 2012-03-23 DIAGNOSIS — R079 Chest pain, unspecified: Secondary | ICD-10-CM

## 2012-03-23 NOTE — Patient Instructions (Addendum)
Your physician recommends that you schedule a follow-up appointment in: To be determined following testing  Your physician has requested that you have a stress echocardiogram. For further information please visit [email or web address] Please follow instruction sheet as given.

## 2012-03-23 NOTE — Progress Notes (Signed)
HPI Patient is a 47 yo who was referred fro evaluation of CP Seen in ER on 2/6.  CP at that time lasted over 8 hours. Started pressure that got more intense  BP was 180/140   Discomfort Constant but  impoved while in ER  Admit was recomm but he refused.  2 cardiac markers were negative.  Since ER visit hs had no further  chest pressure  patient also has history of hypertension and hyperlipidemia.   Patient was seen by Lilyan Punt on 2/10  History of increased BP.  Had PAF 2000, tobacco [place], GERD, HL.   At clinic visit started on Prinizide.  10/12.5 Had been on Prinivil for several years.  Continue ASA< pravachol.    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  Went back to bed  No further spells. No edema.  No presyncope  On lisinopril fro a couple year.s  Usually good control until 2 wks ago.  28 yr tobacco.  1ppd  Trying to quit.  Using blue cig.  Down to 10 per day.  Has  Had some wheezing in past  None recent  Dad has CAD, CV disease Allergies  Allergen Reactions  . Amoxicillin-Pot Clavulanate Other (See Comments)    Cramps and diarrhea  . Levofloxacin Other (See Comments)    Passed out (possibly due to medication)    Current Outpatient Prescriptions  Medication Sig Dispense Refill  . allopurinol (ZYLOPRIM) 100 MG tablet Take 100 mg by mouth daily.      Marland Kitchen aspirin EC 81 MG tablet Take 81 mg by mouth daily.        Marland Kitchen esomeprazole (NEXIUM) 40 MG capsule Take 40 mg by mouth daily before breakfast.      . lisinopril-hydrochlorothiazide (PRINZIDE,ZESTORETIC) 10-12.5 MG per tablet Take 1 tablet by mouth daily.        Marland Kitchen NIACIN PO Take 1 tablet by mouth daily.       . pravastatin (PRAVACHOL) 40 MG tablet Take 40 mg by mouth every evening.         No current facility-administered medications for this visit.    Past Medical History  Diagnosis Date  . Hypertension   . GERD (gastroesophageal reflux disease)   . Hyperlipemia   . Gout     Past Surgical History  Procedure Laterality Date  . Lumbar fusion      3 yrs ago-[place]  . Cholecystectomy      Family History  Problem Relation Age of Onset  . Anesthesia problems Neg Hx   . Hypotension Neg Hx   . Malignant hyperthermia Neg Hx   . Pseudochol deficiency Neg Hx     History   Social History  . Marital Status: Married    Spouse Name: N/A    Number of Children: N/A  . Years of Education: N/A   Occupational History  . Not on file.   Social History Main Topics  . Smoking status: Current Every Day Smoker -- 1.00 packs/day for 20 years    Types: Cigarettes  . Smokeless tobacco: Not on file  . Alcohol Use: No  . Drug Use: No  . Sexually Active: Yes  Birth Control/ Protection: None   Other Topics Concern  . Not on file   Social History Narrative  . No narrative on file    Review of Systems:  All systems reviewed.  They are negative to the above problem except as previously stated.  Vital Signs: BP 143/85  Pulse 83  Ht 5\' 10"  (1.778 m)  Wt 204 lb (92.534 kg)  BMI 29.27 kg/m2  Physical Exam Patient is in NAD HEENT:  Normocephalic, atraumatic. EOMI, PERRLA.  Neck: JVP is normal.  No bruits.  Lungs: clear to auscultation. No rales no wheezes.  Heart: Regular rate and rhythm. Normal S1, S2. No S3.   No significant murmurs. PMI not displaced.  Abdomen:  Supple, nontender. Normal bowel sounds. No masses. No hepatomegaly.  Extremities:   Good distal pulses throughout. No lower extremity edema.  Musculoskeletal :moving all extremities.  Neuro:   alert and oriented x3.  CN II-XII grossly intact.   03/10/12  EKG  SR 70  Poss IWMI  Assessment and Plan:  1.  CP  Atypical but patient has risks.  Will review CT scan for atherosclerosis.  Will set up for  stress echo  2.  HTN  Follow on prinizide.  Take day of test  3.  Lipids  Will get labs  4.  TOb  Congratulated on effort to quit.  Continue.

## 2012-03-30 ENCOUNTER — Ambulatory Visit (HOSPITAL_COMMUNITY)
Admission: RE | Admit: 2012-03-30 | Discharge: 2012-03-30 | Disposition: A | Discharge status: Home / Self Care | Payer: Commercial Managed Care - PPO | Source: Ambulatory Visit | Attending: Internal Medicine | Admitting: Internal Medicine

## 2012-03-30 DIAGNOSIS — R079 Chest pain, unspecified: Secondary | ICD-10-CM | POA: Insufficient documentation

## 2012-03-30 DIAGNOSIS — F17200 Nicotine dependence, unspecified, uncomplicated: Secondary | ICD-10-CM | POA: Insufficient documentation

## 2012-03-30 DIAGNOSIS — R072 Precordial pain: Secondary | ICD-10-CM

## 2012-03-30 NOTE — Progress Notes (Signed)
Stress Lab Nurses Notes - Brian Brewer  Brian Brewer 03/30/2012 Reason for doing test: Chest Pain and Fatigue Type of test: Stress Echo Nurse performing test: Parke Poisson, RN Nuclear Medicine Tech: Not Applicable Echo Tech: Karrie Doffing MD performing test: R. Rothbart Family MD: Lilyan Punt Test explained and consent signed: yes IV started: No IV started Symptoms: fatigue in legs Treatment/Intervention: None Reason test stopped: fatigue and reached target HR After recovery IV was: none Patient to return to Nuc. Med at :NA Patient discharged: Home Patient's Condition upon discharge was: stable & HR  Comments: During test peak BP 199/77 & HR 151.  Recovery BP 142/81 & HR 93.  Symptoms resolved in recovery.  Erskine Speed T

## 2012-03-30 NOTE — Progress Notes (Signed)
*  PRELIMINARY RESULTS* Echocardiogram Echocardiogram Stress Test has been performed.  Conrad National 03/30/2012, 10:21 AM

## 2012-05-21 ENCOUNTER — Ambulatory Visit (INDEPENDENT_AMBULATORY_CARE_PROVIDER_SITE_OTHER): Payer: Commercial Managed Care - PPO | Admitting: Family Medicine

## 2012-05-21 ENCOUNTER — Telehealth: Payer: Self-pay | Admitting: Family Medicine

## 2012-05-21 ENCOUNTER — Encounter: Payer: Self-pay | Admitting: Family Medicine

## 2012-05-21 VITALS — BP 130/88 | Temp 98.6°F | Wt 206.0 lb

## 2012-05-21 DIAGNOSIS — J019 Acute sinusitis, unspecified: Secondary | ICD-10-CM

## 2012-05-21 DIAGNOSIS — J309 Allergic rhinitis, unspecified: Secondary | ICD-10-CM

## 2012-05-21 MED ORDER — CEFPROZIL 500 MG PO TABS: 500.0000 mg | ORAL_TABLET | Freq: Two times a day (BID) | ORAL | Status: DC

## 2012-05-21 NOTE — Telephone Encounter (Signed)
error 

## 2012-05-21 NOTE — Patient Instructions (Signed)
Brian Brewer Colon Health - one per day  Work on quitting smoking  Store brand Allegra 180 mg one daily, if persistant allergy symptoms then call us for flonase prescription

## 2012-05-21 NOTE — Progress Notes (Signed)
  Subjective:    Patient ID: Brian Brewer, male    DOB: Jun 27, 1965, 47 y.o.   MRN: [id number]  Sinusitis This is a new problem. The current episode started in the past 7 days. Maximum temperature: pt could tell he was running fever. The fever has been present for 1 to 2 days. Associated symptoms include chills, congestion, coughing, headaches, sinus pressure, sneezing and a sore throat. Past treatments include acetaminophen and oral decongestants. The treatment provided no relief.      Review of Systems  Constitutional: Positive for chills.  HENT: Positive for congestion, sore throat, sneezing and sinus pressure.   Respiratory: Positive for cough.   Neurological: Positive for headaches.   Patient does relate some allergy issues as well    Objective:   Physical Exam Eardrums normal mild sinus tenderness throat normal neck supple lungs clear heart regular pulses normal blood pressure good       Assessment & Plan:  Allergic rhinitis-Claritin or Allegra every single day may need to add allergy spray to it Sinusitis Cefzil 500 twice a day 10 days call if problems. Patient was counseled to quit smoking.

## 2012-07-16 ENCOUNTER — Encounter (HOSPITAL_COMMUNITY): Payer: Self-pay | Admitting: Cardiology

## 2012-10-22 ENCOUNTER — Ambulatory Visit (INDEPENDENT_AMBULATORY_CARE_PROVIDER_SITE_OTHER): Payer: Commercial Managed Care - PPO | Admitting: Family Medicine

## 2012-10-22 ENCOUNTER — Encounter: Payer: Self-pay | Admitting: Family Medicine

## 2012-10-22 VITALS — BP 138/98 | Ht 70.0 in | Wt 209.4 lb

## 2012-10-22 DIAGNOSIS — E785 Hyperlipidemia, unspecified: Secondary | ICD-10-CM

## 2012-10-22 DIAGNOSIS — Z79899 Other long term (current) drug therapy: Secondary | ICD-10-CM

## 2012-10-22 DIAGNOSIS — I10 Essential (primary) hypertension: Secondary | ICD-10-CM

## 2012-10-22 DIAGNOSIS — J019 Acute sinusitis, unspecified: Secondary | ICD-10-CM

## 2012-10-22 DIAGNOSIS — R7309 Other abnormal glucose: Secondary | ICD-10-CM

## 2012-10-22 DIAGNOSIS — R739 Hyperglycemia, unspecified: Secondary | ICD-10-CM

## 2012-10-22 DIAGNOSIS — R7301 Impaired fasting glucose: Secondary | ICD-10-CM | POA: Insufficient documentation

## 2012-10-22 MED ORDER — FLUTICASONE PROPIONATE 50 MCG/ACT NA SUSP: 2.0000 | Freq: Every day | NASAL | Status: DC

## 2012-10-22 MED ORDER — CEFTRIAXONE SODIUM 1 G IJ SOLR
500.0000 mg | Freq: Once | INTRAMUSCULAR | Status: AC
  Administered 2012-10-22: 500 mg via INTRAMUSCULAR

## 2012-10-22 MED ORDER — CEFPROZIL 500 MG PO TABS: 500.0000 mg | ORAL_TABLET | Freq: Two times a day (BID) | ORAL | Status: DC

## 2012-10-22 NOTE — Progress Notes (Signed)
  Subjective:    Patient ID: Brian Brewer, male    DOB: Oct 18, 1965, 47 y.o.   MRN: [id number]  Sinusitis This is a new problem. The current episode started 1 to 4 weeks ago. The problem has been gradually worsening since onset. Associated symptoms include congestion, coughing, headaches, sinus pressure and a sore throat. Past treatments include acetaminophen and oral decongestants. The treatment provided no relief.    Patient has history hyperlipidemia blood pressure issues and hyperglycemia he tries to eat a healthy diet he's been backing down on his smoking using nicotine electronic cigarette more often  Review of Systems  HENT: Positive for congestion, sore throat and sinus pressure.   Respiratory: Positive for cough.   Neurological: Positive for headaches.       Objective:   Physical Exam  Moderate sinus tenderness throat is normal neck supple lungs are clear no crackles heart is regular blood pressure on 2 readings slightly elevated 134/92 extremities no edema  Patient has been encouraged to quit smoking    Assessment & Plan:  HTN (hypertension), benign - Plan: Basic metabolic panel  Hyperglycemia - Plan: Hemoglobin A1c  Hyperlipemia - Plan: Lipid panel  Acute sinusitis - Plan: cefTRIAXone (ROCEPHIN) injection 500 mg  Encounter for long-term (current) use of other medications - Plan: Hepatic function panel

## 2012-11-02 ENCOUNTER — Other Ambulatory Visit: Payer: Self-pay | Admitting: Family Medicine

## 2012-11-09 ENCOUNTER — Telehealth: Payer: Self-pay | Admitting: Family Medicine

## 2012-11-09 LAB — BASIC METABOLIC PANEL
BUN: 8 mg/dL (ref 6–23)
CO2: 29 mEq/L (ref 19–32)
Calcium: 9.7 mg/dL (ref 8.4–10.5)
Chloride: 104 mEq/L (ref 96–112)
Creat: 0.99 mg/dL (ref 0.50–1.35)
Glucose, Bld: 111 mg/dL — ABNORMAL HIGH (ref 70–99)
Potassium: 4.4 mEq/L (ref 3.5–5.3)
Sodium: 138 mEq/L (ref 135–145)

## 2012-11-09 LAB — HEPATIC FUNCTION PANEL
ALT: 23 U/L (ref 0–53)
AST: 19 U/L (ref 0–37)
Albumin: 3.9 g/dL (ref 3.5–5.2)
Alkaline Phosphatase: 76 U/L (ref 39–117)
Bilirubin, Direct: 0.1 mg/dL (ref 0.0–0.3)
Indirect Bilirubin: 0.2 mg/dL (ref 0.0–0.9)
Total Bilirubin: 0.3 mg/dL (ref 0.3–1.2)
Total Protein: 6.7 g/dL (ref 6.0–8.3)

## 2012-11-09 LAB — LIPID PANEL
Cholesterol: 123 mg/dL (ref 0–200)
HDL: 26 mg/dL — ABNORMAL LOW (ref >39)
LDL Cholesterol: 69 mg/dL (ref 0–99)
Total CHOL/HDL Ratio: 4.7 Ratio
Triglycerides: 139 mg/dL (ref <150)
VLDL: 28 mg/dL (ref 0–40)

## 2012-11-09 LAB — HEMOGLOBIN A1C
Hgb A1c MFr Bld: 6 % — ABNORMAL HIGH (ref <5.7)
Mean Plasma Glucose: 126 mg/dL — ABNORMAL HIGH (ref <117)

## 2012-11-09 MED ORDER — DOXYCYCLINE HYCLATE 100 MG PO TABS: 100.0000 mg | ORAL_TABLET | Freq: Two times a day (BID) | ORAL | Status: DC

## 2012-11-09 MED ORDER — ESOMEPRAZOLE MAGNESIUM 40 MG PO CPDR: 40.0000 mg | DELAYED_RELEASE_CAPSULE | Freq: Every day | ORAL | Status: DC

## 2012-11-09 NOTE — Telephone Encounter (Signed)
Left message on voicemail to return call. Need another pharmacy because [facility] is no longer taking prescriptions.

## 2012-11-09 NOTE — Telephone Encounter (Signed)
Medication sent into [facility] per patient request. Patient notified.

## 2012-11-09 NOTE — Telephone Encounter (Signed)
Doxy 100, #20, 1 bid with snack and water,fu if ongoing

## 2012-11-09 NOTE — Telephone Encounter (Signed)
Pt coming in to say he doesn't feel any better at this point, he has taken all his antibiotics and he has already had a shot. Still feeling the stuffy, cough at night. Please call anything into [facility]

## 2012-11-11 ENCOUNTER — Encounter: Payer: Self-pay | Admitting: Family Medicine

## 2013-01-11 ENCOUNTER — Telehealth: Payer: Self-pay | Admitting: Family Medicine

## 2013-01-11 MED ORDER — ESOMEPRAZOLE MAGNESIUM 40 MG PO CPDR: 40.0000 mg | DELAYED_RELEASE_CAPSULE | Freq: Every day | ORAL | Status: DC

## 2013-01-11 NOTE — Telephone Encounter (Signed)
Patient needs Rx for nexium to his new pharmacy [facility] [place]

## 2013-01-11 NOTE — Telephone Encounter (Signed)
Medication was sent in to pharmacy. Patient was notified.  

## 2013-02-11 ENCOUNTER — Other Ambulatory Visit: Payer: Self-pay | Admitting: *Deleted

## 2013-02-11 MED ORDER — PRAVASTATIN SODIUM 40 MG PO TABS: 40.0000 mg | ORAL_TABLET | Freq: Every evening | ORAL | Status: DC

## 2013-04-25 ENCOUNTER — Other Ambulatory Visit: Payer: Self-pay | Admitting: Family Medicine

## 2013-04-26 ENCOUNTER — Telehealth: Payer: Self-pay | Admitting: Family Medicine

## 2013-04-26 MED ORDER — ESOMEPRAZOLE MAGNESIUM 40 MG PO CPDR: 40.0000 mg | DELAYED_RELEASE_CAPSULE | Freq: Every day | ORAL | Status: DC

## 2013-04-26 NOTE — Telephone Encounter (Signed)
Patient needs Rx for nexium. They filled it at [facility] as generic, but he says that is not working. He would like a refill of the nexium, not generic.   [facility] [place]

## 2013-04-26 NOTE — Telephone Encounter (Signed)
Medication sent to pharmacy. Left message on voicemail notifying patient.  

## 2013-04-26 NOTE — Telephone Encounter (Signed)
We can send it in as brand name BUT it is possible that even with that his insur co will say generic only.

## 2013-06-06 ENCOUNTER — Encounter: Payer: Self-pay | Admitting: Family Medicine

## 2013-06-06 ENCOUNTER — Ambulatory Visit (INDEPENDENT_AMBULATORY_CARE_PROVIDER_SITE_OTHER): Payer: Commercial Managed Care - PPO | Admitting: Family Medicine

## 2013-06-06 VITALS — BP 130/86 | Ht 70.0 in | Wt 207.0 lb

## 2013-06-06 DIAGNOSIS — R7309 Other abnormal glucose: Secondary | ICD-10-CM

## 2013-06-06 DIAGNOSIS — Z79899 Other long term (current) drug therapy: Secondary | ICD-10-CM

## 2013-06-06 DIAGNOSIS — E785 Hyperlipidemia, unspecified: Secondary | ICD-10-CM

## 2013-06-06 DIAGNOSIS — Z131 Encounter for screening for diabetes mellitus: Secondary | ICD-10-CM

## 2013-06-06 DIAGNOSIS — M109 Gout, unspecified: Secondary | ICD-10-CM

## 2013-06-06 DIAGNOSIS — Z23 Encounter for immunization: Secondary | ICD-10-CM

## 2013-06-06 DIAGNOSIS — I10 Essential (primary) hypertension: Secondary | ICD-10-CM

## 2013-06-06 DIAGNOSIS — R7303 Prediabetes: Secondary | ICD-10-CM

## 2013-06-06 MED ORDER — ALLOPURINOL 100 MG PO TABS: ORAL_TABLET | ORAL | Status: DC

## 2013-06-06 MED ORDER — PRAVASTATIN SODIUM 40 MG PO TABS: 40.0000 mg | ORAL_TABLET | Freq: Every evening | ORAL | Status: DC

## 2013-06-06 MED ORDER — LISINOPRIL-HYDROCHLOROTHIAZIDE 10-12.5 MG PO TABS: ORAL_TABLET | ORAL | Status: DC

## 2013-06-06 MED ORDER — ESOMEPRAZOLE MAGNESIUM 40 MG PO CPDR: 40.0000 mg | DELAYED_RELEASE_CAPSULE | Freq: Every day | ORAL | Status: DC

## 2013-06-06 NOTE — Progress Notes (Signed)
   Subjective:    Patient ID: Brian Brewer, male    DOB: [birth year], 48 y.o.   MRN: [id number]  HPI Patient is here today to get a refill on his meds. Patient relates compliance with medicines. He does state that he does not have any chest pain shortness breath nausea vomiting diarrhea. He states his energy level overall doing good. Patient does state he still smokes he knows he needs to quit. We talked about that at length. He denies any rectal bleeding no hematuria. Patient's stress levels fairly high but tolerating denies depression  He takes his meds every day.  Has no symptoms.   He has no questions/concerns.     Review of Systems  Constitutional: Negative for activity change, appetite change and fatigue.  HENT: Negative for congestion.   Respiratory: Negative for cough and shortness of breath.   Cardiovascular: Negative for chest pain.  Gastrointestinal: Negative for abdominal pain.  Endocrine: Negative for polydipsia and polyphagia.  Genitourinary: Negative for difficulty urinating.  Neurological: Negative for weakness and headaches.  Psychiatric/Behavioral: Negative for confusion.       Objective:   Physical Exam  Vitals reviewed. Constitutional: He appears well-nourished. No distress.  Cardiovascular: Normal rate, regular rhythm and normal heart sounds.   No murmur heard. Pulmonary/Chest: Effort normal and breath sounds normal. No respiratory distress.  Musculoskeletal: He exhibits no edema.  Lymphadenopathy:    He has no cervical adenopathy.  Neurological: He is alert.  Psychiatric: His behavior is normal.          Assessment & Plan:  1. Hyperlipemia Patient is to continue his medication check lab work watch diet. Blood pressure under good control currently - Lipid panel  2. HTN (hypertension), benign Blood pressure under good control currently watch diet closely - Basic metabolic panel  3. Need for prophylactic vaccination and inoculation against  other viral diseases(V04.89) Pneumonia vaccine to minimize risk do to smoking - Pneumococcal polysaccharide vaccine 23-valent greater than or equal to 2yo subcutaneous/IM  4. Need for prophylactic vaccination with combined diphtheria-tetanus-pertussis (DTP) vaccine Needs tetanus shot - Td vaccine greater than or equal to 7yo preservative free IM  5. Encounter for long-term (current) use of other medications Because of pravastatin check liver functions - Hepatic function panel  6. Screening for diabetes mellitus Patient has had some problems with hyperglycemia we will check hemoglobin A1c he also has history of gout check uric acid level continue allopurinol minimize starches in the diet. - Hemoglobin A1c - Uric acid

## 2013-06-06 NOTE — Patient Instructions (Signed)
DASH Diet The DASH diet stands for "Dietary Approaches to Stop Hypertension." It is a healthy eating plan that has been shown to reduce high blood pressure (hypertension) in as little as 14 days, while also possibly providing other significant health benefits. These other health benefits include reducing the risk of breast cancer after menopause and reducing the risk of type 2 diabetes, heart disease, colon cancer, and stroke. Health benefits also include weight loss and slowing kidney failure in patients with chronic kidney disease.  DIET GUIDELINES  Limit salt (sodium). Your diet should contain less than 1500 mg of sodium daily.  Limit refined or processed carbohydrates. Your diet should include mostly whole grains. Desserts and added sugars should be used sparingly.  Include small amounts of heart-healthy fats. These types of fats include nuts, oils, and tub margarine. Limit saturated and trans fats. These fats have been shown to be harmful in the body. CHOOSING FOODS  The following food groups are based on a 2000 calorie diet. See your Registered Dietitian for individual calorie needs. Grains and Grain Products (6 to 8 servings daily)  Eat More Often: Whole-wheat bread, brown rice, whole-grain or wheat pasta, quinoa, popcorn without added fat or salt (air popped).  Eat Less Often: White bread, white pasta, white rice, cornbread. Vegetables (4 to 5 servings daily)  Eat More Often: Fresh, frozen, and canned vegetables. Vegetables may be raw, steamed, roasted, or grilled with a minimal amount of fat.  Eat Less Often/Avoid: Creamed or fried vegetables. Vegetables in a cheese sauce. Fruit (4 to 5 servings daily)  Eat More Often: All fresh, canned (in natural juice), or frozen fruits. Dried fruits without added sugar. One hundred percent fruit juice ( cup [237 mL] daily).  Eat Less Often: Dried fruits with added sugar. Canned fruit in light or heavy syrup. Lean Meats, Fish, and Poultry (2  servings or less daily. One serving is 3 to 4 oz [85-114 g]).  Eat More Often: Ninety percent or leaner ground beef, tenderloin, sirloin. Round cuts of beef, chicken breast, turkey breast. All fish. Grill, bake, or broil your meat. Nothing should be fried.  Eat Less Often/Avoid: Fatty cuts of meat, turkey, or chicken leg, thigh, or wing. Fried cuts of meat or fish. Dairy (2 to 3 servings)  Eat More Often: Low-fat or fat-free milk, low-fat plain or light yogurt, reduced-fat or part-skim cheese.  Eat Less Often/Avoid: Milk (whole, 2%).Whole milk yogurt. Full-fat cheeses. Nuts, Seeds, and Legumes (4 to 5 servings per week)  Eat More Often: All without added salt.  Eat Less Often/Avoid: Salted nuts and seeds, canned beans with added salt. Fats and Sweets (limited)  Eat More Often: Vegetable oils, tub margarines without trans fats, sugar-free gelatin. Mayonnaise and salad dressings.  Eat Less Often/Avoid: Coconut oils, palm oils, butter, stick margarine, cream, half and half, cookies, candy, pie. FOR MORE INFORMATION The Dash Diet Eating Plan: www.dashdiet.org Document Released: 01/06/2011 Document Revised: 04/11/2011 Document Reviewed: 01/06/2011 Exit[employer] Patient Information 2014 [employer]. Smoking Cessation Quitting smoking is important to your health and has many advantages. However, it is not always easy to quit since nicotine is a very addictive drug. Often times, people try 3 times or more before being able to quit. This document explains the best ways for you to prepare to quit smoking. Quitting takes hard work and a lot of effort, but you can do it. ADVANTAGES OF QUITTING SMOKING  You will live longer, feel better, and live better.  Your body will feel the   impact of quitting smoking almost immediately.  Within 20 minutes, blood pressure decreases. Your pulse returns to its normal level.  After 8 hours, carbon monoxide levels in the blood return to normal. Your oxygen level  increases.  After 24 hours, the chance of having a heart attack starts to decrease. Your breath, hair, and body stop smelling like smoke.  After 48 hours, damaged nerve endings begin to recover. Your sense of taste and smell improve.  After 72 hours, the body is virtually free of nicotine. Your bronchial tubes relax and breathing becomes easier.  After 2 to 12 weeks, lungs can hold more air. Exercise becomes easier and circulation improves.  The risk of having a heart attack, stroke, cancer, or lung disease is greatly reduced.  After 1 year, the risk of coronary heart disease is cut in half.  After 5 years, the risk of stroke falls to the same as a nonsmoker.  After 10 years, the risk of lung cancer is cut in half and the risk of other cancers decreases significantly.  After 15 years, the risk of coronary heart disease drops, usually to the level of a nonsmoker.  If you are pregnant, quitting smoking will improve your chances of having a healthy baby.  The people you live with, especially any children, will be healthier.  You will have extra money to spend on things other than cigarettes. QUESTIONS TO THINK ABOUT BEFORE ATTEMPTING TO QUIT You may want to talk about your answers with your caregiver.  Why do you want to quit?  If you tried to quit in the past, what helped and what did not?  What will be the most difficult situations for you after you quit? How will you plan to handle them?  Who can help you through the tough times? Your family? Friends? A caregiver?  What pleasures do you get from smoking? What ways can you still get pleasure if you quit? Here are some questions to ask your caregiver:  How can you help me to be successful at quitting?  What medicine do you think would be best for me and how should I take it?  What should I do if I need more help?  What is smoking withdrawal like? How can I get information on withdrawal? GET READY  Set a quit  date.  Change your environment by getting rid of all cigarettes, ashtrays, matches, and lighters in your home, car, or work. Do not let people smoke in your home.  Review your past attempts to quit. Think about what worked and what did not. GET SUPPORT AND ENCOURAGEMENT You have a better chance of being successful if you have help. You can get support in many ways.  Tell your family, friends, and co-workers that you are going to quit and need their support. Ask them not to smoke around you.  Get individual, group, or telephone counseling and support. Programs are available at local hospitals and health centers. Call your local health department for information about programs in your area.  Spiritual beliefs and practices may help some smokers quit.  Download a "quit meter" on your computer to keep track of quit statistics, such as how long you have gone without smoking, cigarettes not smoked, and money saved.  Get a self-help book about quitting smoking and staying off of tobacco. LEARN NEW SKILLS AND BEHAVIORS  Distract yourself from urges to smoke. Talk to someone, go for a walk, or occupy your time with a task.  Change your   normal routine. Take a different route to work. Drink tea instead of coffee. Eat breakfast in a different place.  Reduce your stress. Take a hot bath, exercise, or read a book.  Plan something enjoyable to do every day. Reward yourself for not smoking.  Explore interactive web-based programs that specialize in helping you quit. GET MEDICINE AND USE IT CORRECTLY Medicines can help you stop smoking and decrease the urge to smoke. Combining medicine with the above behavioral methods and support can greatly increase your chances of successfully quitting smoking.  Nicotine replacement therapy helps deliver nicotine to your body without the negative effects and risks of smoking. Nicotine replacement therapy includes nicotine gum, lozenges, inhalers, nasal sprays, and  skin patches. Some may be available over-the-counter and others require a prescription.  Antidepressant medicine helps people abstain from smoking, but how this works is unknown. This medicine is available by prescription.  Nicotinic receptor partial agonist medicine simulates the effect of nicotine in your brain. This medicine is available by prescription. Ask your caregiver for advice about which medicines to use and how to use them based on your health history. Your caregiver will tell you what side effects to look out for if you choose to be on a medicine or therapy. Carefully read the information on the package. Do not use any other product containing nicotine while using a nicotine replacement product.  RELAPSE OR DIFFICULT SITUATIONS Most relapses occur within the first 3 months after quitting. Do not be discouraged if you start smoking again. Remember, most people try several times before finally quitting. You may have symptoms of withdrawal because your body is used to nicotine. You may crave cigarettes, be irritable, feel very hungry, cough often, get headaches, or have difficulty concentrating. The withdrawal symptoms are only temporary. They are strongest when you first quit, but they will go away within 10 14 days. To reduce the chances of relapse, try to:  Avoid drinking alcohol. Drinking lowers your chances of successfully quitting.  Reduce the amount of caffeine you consume. Once you quit smoking, the amount of caffeine in your body increases and can give you symptoms, such as a rapid heartbeat, sweating, and anxiety.  Avoid smokers because they can make you want to smoke.  Do not let weight gain distract you. Many smokers will gain weight when they quit, usually less than 10 pounds. Eat a healthy diet and stay active. You can always lose the weight gained after you quit.  Find ways to improve your mood other than smoking. FOR MORE INFORMATION  www.smokefree.gov  Document  Released: 01/11/2001 Document Revised: 07/19/2011 Document Reviewed: 04/28/2011 Exit[employer] Patient Information 2014 [employer].  

## 2013-06-07 LAB — HEMOGLOBIN A1C
HEMOGLOBIN A1C: 5.8 % — AB (ref <5.7)
Mean Plasma Glucose: 120 mg/dL — ABNORMAL HIGH (ref <117)

## 2013-06-08 LAB — LIPID PANEL
CHOL/HDL RATIO: 5.1 ratio
CHOLESTEROL: 128 mg/dL (ref 0–200)
HDL: 25 mg/dL — ABNORMAL LOW (ref >39)
LDL Cholesterol: 70 mg/dL (ref 0–99)
Triglycerides: 165 mg/dL — ABNORMAL HIGH (ref <150)
VLDL: 33 mg/dL (ref 0–40)

## 2013-06-08 LAB — BASIC METABOLIC PANEL
BUN: 14 mg/dL (ref 6–23)
CHLORIDE: 103 meq/L (ref 96–112)
CO2: 26 mEq/L (ref 19–32)
Calcium: 8.9 mg/dL (ref 8.4–10.5)
Creat: 1.01 mg/dL (ref 0.50–1.35)
GLUCOSE: 103 mg/dL — AB (ref 70–99)
POTASSIUM: 4.3 meq/L (ref 3.5–5.3)
Sodium: 138 mEq/L (ref 135–145)

## 2013-06-08 LAB — HEPATIC FUNCTION PANEL
ALBUMIN: 4 g/dL (ref 3.5–5.2)
ALT: 18 U/L (ref 0–53)
AST: 18 U/L (ref 0–37)
Alkaline Phosphatase: 70 U/L (ref 39–117)
Bilirubin, Direct: 0.1 mg/dL (ref 0.0–0.3)
Indirect Bilirubin: 0.2 mg/dL (ref 0.2–1.2)
Total Bilirubin: 0.3 mg/dL (ref 0.2–1.2)
Total Protein: 6.5 g/dL (ref 6.0–8.3)

## 2013-06-08 LAB — URIC ACID: URIC ACID, SERUM: 5.8 mg/dL (ref 4.0–7.8)

## 2013-06-10 ENCOUNTER — Encounter: Payer: Self-pay | Admitting: Family Medicine

## 2013-12-11 ENCOUNTER — Encounter: Payer: Self-pay | Admitting: Family Medicine

## 2013-12-11 DIAGNOSIS — L4050 Arthropathic psoriasis, unspecified: Secondary | ICD-10-CM | POA: Insufficient documentation

## 2013-12-11 DIAGNOSIS — E790 Hyperuricemia without signs of inflammatory arthritis and tophaceous disease: Secondary | ICD-10-CM | POA: Insufficient documentation

## 2013-12-11 HISTORY — DX: Arthropathic psoriasis, unspecified: L40.50

## 2014-02-14 ENCOUNTER — Telehealth: Payer: Self-pay | Admitting: Nurse Practitioner

## 2014-02-14 ENCOUNTER — Other Ambulatory Visit: Payer: Self-pay | Admitting: Nurse Practitioner

## 2014-02-14 ENCOUNTER — Encounter: Payer: Self-pay | Admitting: Nurse Practitioner

## 2014-02-14 ENCOUNTER — Ambulatory Visit (INDEPENDENT_AMBULATORY_CARE_PROVIDER_SITE_OTHER): Payer: Commercial Managed Care - PPO | Admitting: Nurse Practitioner

## 2014-02-14 VITALS — BP 140/90 | Temp 99.2°F | Ht 70.0 in | Wt 207.0 lb

## 2014-02-14 DIAGNOSIS — R1011 Right upper quadrant pain: Secondary | ICD-10-CM

## 2014-02-14 LAB — BASIC METABOLIC PANEL
BUN: 10 mg/dL (ref 6–23)
CO2: 29 mEq/L (ref 19–32)
CREATININE: 1.08 mg/dL (ref 0.50–1.35)
Calcium: 9.3 mg/dL (ref 8.4–10.5)
Chloride: 104 mEq/L (ref 96–112)
Glucose, Bld: 73 mg/dL (ref 70–99)
Potassium: 3.4 mEq/L — ABNORMAL LOW (ref 3.5–5.3)
Sodium: 138 mEq/L (ref 135–145)

## 2014-02-14 LAB — CBC WITH DIFFERENTIAL/PLATELET
BASOS ABS: 0.1 10*3/uL (ref 0.0–0.1)
Basophils Relative: 1 % (ref 0–1)
EOS ABS: 0.4 10*3/uL (ref 0.0–0.7)
Eosinophils Relative: 3 % (ref 0–5)
HCT: 44.1 % (ref 39.0–52.0)
HEMOGLOBIN: 14.3 g/dL (ref 13.0–17.0)
Lymphocytes Relative: 30 % (ref 12–46)
Lymphs Abs: 3.1 10*3/uL (ref 0.7–4.0)
MCH: 28.8 pg (ref 26.0–34.0)
MCHC: 32.4 g/dL (ref 30.0–36.0)
MCV: 88.9 fL (ref 78.0–100.0)
MONO ABS: 0.8 10*3/uL (ref 0.1–1.0)
Monocytes Relative: 8 % (ref 3–12)
Neutro Abs: 5.9 10*3/uL (ref 1.7–7.7)
Neutrophils Relative %: 58 % (ref 43–77)
Platelets: 222 10*3/uL (ref 150–400)
RBC: 4.96 MIL/uL (ref 4.22–5.81)
RDW: 13.8 % (ref 11.5–15.5)
WBC: 9.3 10*3/uL (ref 4.0–10.5)

## 2014-02-14 LAB — HEPATIC FUNCTION PANEL
ALK PHOS: 71 U/L (ref 39–117)
ALT: 27 U/L (ref 0–53)
AST: 20 U/L (ref 0–37)
Albumin: 4.1 g/dL (ref 3.5–5.2)
Total Bilirubin: 0.3 mg/dL (ref 0.3–1.2)
Total Protein: 6.9 g/dL (ref 6.0–8.3)

## 2014-02-14 LAB — POCT URINALYSIS DIPSTICK
Spec Grav, UA: 1.01
pH, UA: 6

## 2014-02-14 LAB — LIPASE: Lipase: 37 U/L (ref 11–59)

## 2014-02-14 MED ORDER — POTASSIUM CHLORIDE ER 10 MEQ PO TBCR: 10.0000 meq | EXTENDED_RELEASE_TABLET | Freq: Every day | ORAL | Status: DC

## 2014-02-14 MED ORDER — HYDROCODONE-ACETAMINOPHEN 5-325 MG PO TABS: 1.0000 | ORAL_TABLET | ORAL | Status: DC | PRN

## 2014-02-14 NOTE — Telephone Encounter (Signed)
Patient came by to pick up rx from [facility]

## 2014-02-14 NOTE — Telephone Encounter (Signed)
Pt wants to go ahead and have pain meds called in to [facility].

## 2014-02-16 ENCOUNTER — Encounter: Payer: Self-pay | Admitting: Nurse Practitioner

## 2014-02-16 NOTE — Progress Notes (Signed)
Subjective:  Presents for c/o RUQ abdominal pain. Began yesterday AM; knife like pain that resolved. Nausea no vomiting. No fever. Slight loose stools. Stools normal color. No blood. Was able to go to work yesterday. Pain came back this am around 3:30; lasted about 2 hours; better, mainly sore now. No cough. Reflux stable on Nexium. Mild urinary frequency x 1 week. No dysuria. No hematuria. No flank pain. Leaving for a weekend beach trip in a few hours.   Objective:   BP 140/90 mmHg  Temp(Src) 99.2 F (37.3 C)  Ht 5\' 10"  (1.778 m)  Wt 207 lb (93.895 kg)  BMI 29.70 kg/m2 NAD. Alert, oriented. Lungs clear. Heart RRR. No CVA tenderness. Abdomen soft, nondistended with active BS x 4. Distinct tenderness RUQ near costal margin. No obvious hepatomegaly. No masses. Otherwise benign exam.  Results for orders placed or performed in visit on 02/14/14  POCT urinalysis dipstick  Result Value Ref Range   Color, UA     Clarity, UA     Glucose, UA     Bilirubin, UA     Ketones, UA     Spec Grav, UA 1.010    Blood, UA     pH, UA 6.0    Protein, UA     Urobilinogen, UA     Nitrite, UA     Leukocytes, UA       Assessment: RUQ abdominal pain - Plan: CBC with Differential, Hepatic function panel, Basic metabolic panel, Lipase, POCT urinalysis dipstick  Plan:  Meds ordered this encounter  Medications  . HYDROcodone-acetaminophen (NORCO/VICODIN) 5-325 MG per tablet    Sig: Take 1 tablet by mouth every 4 (four) hours as needed.    Dispense:  30 tablet    Refill:  0    Order Specific Question:  Supervising Provider    Answer:  Mikey Kirschner [[id number]]   Warning signs reviewed. Seek out care if worsens. Call back in 72 hours if recurs.  Note: stat labs came back through fax: WBC, lipase and liver tests were normal. Patient was notified by phone.

## 2014-03-14 ENCOUNTER — Other Ambulatory Visit: Payer: Self-pay | Admitting: Family Medicine

## 2014-04-21 ENCOUNTER — Other Ambulatory Visit: Payer: Self-pay | Admitting: Family Medicine

## 2014-04-24 ENCOUNTER — Other Ambulatory Visit: Payer: Self-pay | Admitting: Family Medicine

## 2014-04-24 ENCOUNTER — Ambulatory Visit (INDEPENDENT_AMBULATORY_CARE_PROVIDER_SITE_OTHER): Payer: Commercial Managed Care - PPO | Admitting: Family Medicine

## 2014-04-24 ENCOUNTER — Encounter: Payer: Self-pay | Admitting: Family Medicine

## 2014-04-24 VITALS — BP 122/86 | Temp 98.5°F | Ht 70.0 in | Wt 211.0 lb

## 2014-04-24 DIAGNOSIS — J329 Chronic sinusitis, unspecified: Secondary | ICD-10-CM

## 2014-04-24 MED ORDER — ALBUTEROL SULFATE HFA 108 (90 BASE) MCG/ACT IN AERS: 2.0000 | INHALATION_SPRAY | Freq: Four times a day (QID) | RESPIRATORY_TRACT | Status: DC | PRN

## 2014-04-24 MED ORDER — CEFDINIR 300 MG PO CAPS: 300.0000 mg | ORAL_CAPSULE | Freq: Two times a day (BID) | ORAL | Status: DC

## 2014-04-24 NOTE — Progress Notes (Signed)
   Subjective:    Patient ID: Brian Brewer, male    DOB: January 24, 1966, 49 y.o.   MRN: [id number]  Sinusitis This is a new problem. The current episode started 1 to 4 weeks ago. The problem has been gradually worsening since onset. There has been no fever. Associated symptoms include congestion, coughing, headaches and sinus pressure. (Wheezing) Past treatments include oral decongestants. The treatment provided mild relief.   Early days got very sick   peristent cough and cong and sinuses  Blowing nose often   Coughing some  amox     Review of Systems  HENT: Positive for congestion and sinus pressure.   Respiratory: Positive for cough.   Neurological: Positive for headaches.       Objective:   Physical Exam  Alert moderate malaise HEENT nasal congestion frontal tenderness pharynx normal. Lungs bilateral rhonchi and wheeze heart regular in rhythm      Assessment & Plan:  Impression sinusitis bronchitis. Likely post influenza. 2 weeks ago had flu like illness but through "tele-doc" received a prescription of plain amoxicillin. plan antibiotics prescribed. Albuterol when necessary. Cut down smoking. WSL

## 2014-05-23 ENCOUNTER — Ambulatory Visit (INDEPENDENT_AMBULATORY_CARE_PROVIDER_SITE_OTHER): Payer: Commercial Managed Care - PPO | Admitting: Family Medicine

## 2014-05-23 ENCOUNTER — Encounter: Payer: Self-pay | Admitting: Family Medicine

## 2014-05-23 VITALS — BP 126/82 | Ht 70.0 in | Wt 208.2 lb

## 2014-05-23 DIAGNOSIS — R739 Hyperglycemia, unspecified: Secondary | ICD-10-CM | POA: Diagnosis not present

## 2014-05-23 DIAGNOSIS — E785 Hyperlipidemia, unspecified: Secondary | ICD-10-CM

## 2014-05-23 DIAGNOSIS — I10 Essential (primary) hypertension: Secondary | ICD-10-CM

## 2014-05-23 MED ORDER — PRAVASTATIN SODIUM 40 MG PO TABS: 40.0000 mg | ORAL_TABLET | Freq: Every evening | ORAL | Status: DC

## 2014-05-23 MED ORDER — ALLOPURINOL 100 MG PO TABS: 100.0000 mg | ORAL_TABLET | Freq: Every day | ORAL | Status: DC

## 2014-05-23 MED ORDER — LISINOPRIL-HYDROCHLOROTHIAZIDE 10-12.5 MG PO TABS: 1.0000 | ORAL_TABLET | Freq: Every day | ORAL | Status: DC

## 2014-05-23 MED ORDER — ESOMEPRAZOLE MAGNESIUM 40 MG PO CPDR: DELAYED_RELEASE_CAPSULE | ORAL | Status: DC

## 2014-05-23 MED ORDER — BUPROPION HCL ER (SR) 150 MG PO TB12: 150.0000 mg | ORAL_TABLET | Freq: Two times a day (BID) | ORAL | Status: DC

## 2014-05-23 NOTE — Progress Notes (Signed)
   Subjective:    Patient ID: Brian Brewer, male    DOB: January 24, 1966, 49 y.o.   MRN: [id number]  Hypertension This is a chronic problem. The current episode started more than 1 year ago. The problem has been gradually improving since onset. The problem is controlled. Pertinent negatives include no chest pain or shortness of breath. There are no associated agents to hypertension. There are no known risk factors for coronary artery disease. Treatments tried: lisinopril-hctz. The current treatment provides significant improvement. There are no compliance problems.       Review of Systems  Constitutional: Negative for activity change, appetite change and fatigue.  HENT: Negative for congestion.   Respiratory: Negative for cough and shortness of breath.   Cardiovascular: Negative for chest pain and leg swelling.  Gastrointestinal: Negative for abdominal pain.  Endocrine: Negative for polydipsia and polyphagia.  Neurological: Negative for weakness.  Psychiatric/Behavioral: Negative for confusion.       Objective:   Physical Exam  Constitutional: He appears well-nourished. No distress.  Cardiovascular: Normal rate, regular rhythm and normal heart sounds.   No murmur heard. Pulmonary/Chest: Effort normal and breath sounds normal. No respiratory distress.  Musculoskeletal: He exhibits no edema.  Lymphadenopathy:    He has no cervical adenopathy.  Neurological: He is alert.  Psychiatric: His behavior is normal.  Vitals reviewed.   He denies any hemoptysis or rectal bleeding      Assessment & Plan:  HTN (hypertension), benign - Plan: Basic metabolic panel  Hyperlipemia - Plan: Lipid panel  Hyperglycemia - Plan: Hemoglobin A1c  med Patient's overall health is doing fairly good. He does need to do a good job with eating properly. He does take his medicines. His blood pressure is good. I encouraged him strongly to quit smoking. I recommended follow-up in 6 months.  Patient is  interested in quitting smoking but states Chantix caused problems. He consented to taking Wellbutrin twice a day. This will help with stress and may help him quit smoking use it for the next 3 months if any side effects or problems immediately stop medicine

## 2014-07-26 IMAGING — CR DG CHEST 2V
2 series · 2 of 2 positions shown · non-contrast
Comparison: 03/08/2012

CLINICAL DATA: Chest pain

CHEST - 2 VIEW

[view not recorded (1 of 2)]
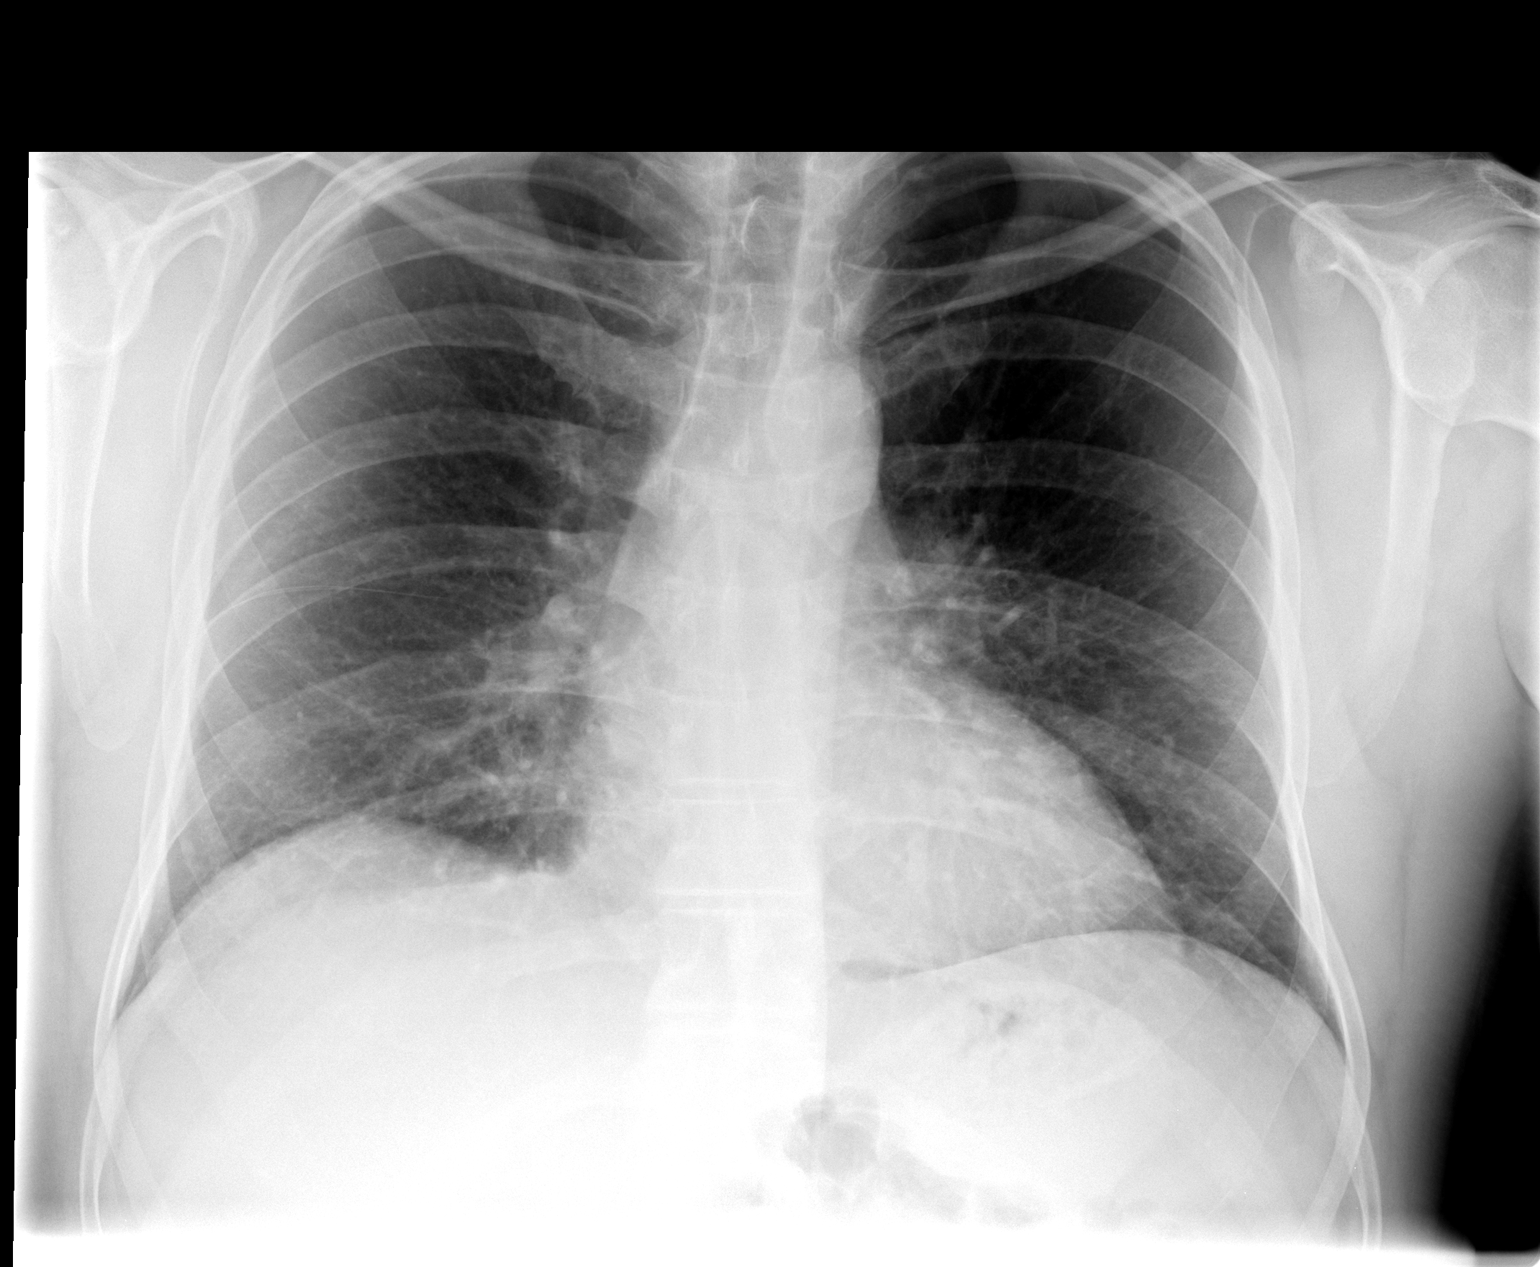

[view not recorded (2 of 2)]
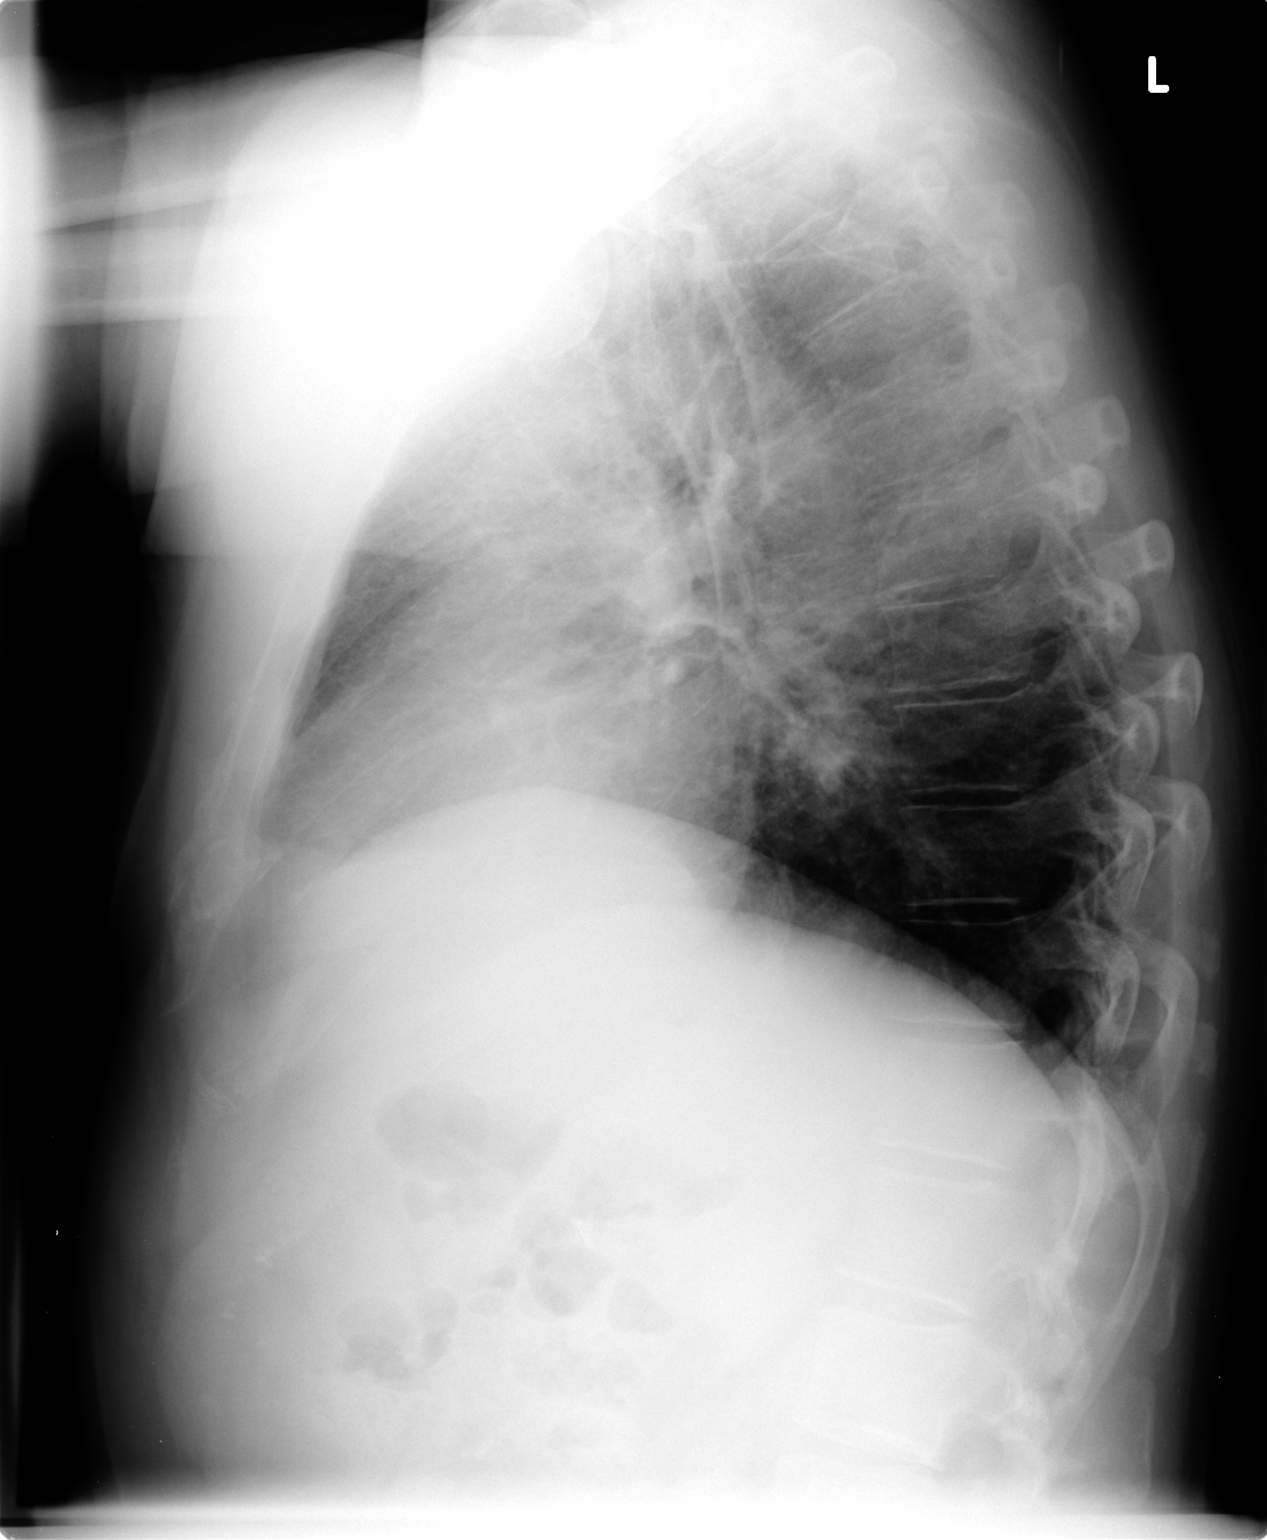

[2 of 2 positions shown; findings below may reference images not displayed]

FINDINGS: Lungs are essentially clear.  No focal consolidation.  No
pleural effusion or pneumothorax.

The heart is normal in size.

Visualized osseous structures are within normal limits.
IMPRESSION: No evidence of acute cardiopulmonary disease.

## 2014-08-13 ENCOUNTER — Other Ambulatory Visit: Payer: Self-pay | Admitting: Family Medicine

## 2014-08-25 ENCOUNTER — Encounter: Payer: Self-pay | Admitting: Family Medicine

## 2014-08-25 ENCOUNTER — Ambulatory Visit (INDEPENDENT_AMBULATORY_CARE_PROVIDER_SITE_OTHER): Payer: Commercial Managed Care - PPO | Admitting: Family Medicine

## 2014-08-25 VITALS — BP 114/82 | Temp 98.3°F | Ht 70.0 in | Wt 205.5 lb

## 2014-08-25 DIAGNOSIS — M791 Myalgia, unspecified site: Secondary | ICD-10-CM

## 2014-08-25 DIAGNOSIS — R5383 Other fatigue: Secondary | ICD-10-CM

## 2014-08-25 LAB — POCT URINALYSIS DIPSTICK: pH, UA: 6

## 2014-08-25 MED ORDER — VARENICLINE TARTRATE 0.5 MG X 11 & 1 MG X 42 PO MISC: ORAL | Status: DC

## 2014-08-25 MED ORDER — DOXYCYCLINE HYCLATE 100 MG PO CAPS: 100.0000 mg | ORAL_CAPSULE | Freq: Two times a day (BID) | ORAL | Status: DC

## 2014-08-25 MED ORDER — VARENICLINE TARTRATE 1 MG PO TABS: 1.0000 mg | ORAL_TABLET | Freq: Two times a day (BID) | ORAL | Status: DC

## 2014-08-25 NOTE — Progress Notes (Signed)
   Subjective:    Patient ID: Brian Brewer, male    DOB: 1965/04/16, 49 y.o.   MRN: [id number]  Muscle Pain This is a new problem. The current episode started in the past 7 days. The problem occurs constantly. The problem is unchanged. The pain occurs in the context of an injury. Pain location: all over body  The pain is medium. Associated symptoms include fatigue. Pertinent negatives include no abdominal pain, chest pain, fever, headaches, rash, vomiting, weakness or wheezing. Past treatments include OTC NSAID. The treatment provided no relief. There is no swelling present. He has been behaving normally.    Patient has no other concerns at this time.    Review of Systems  Constitutional: Positive for fatigue. Negative for fever, activity change and appetite change.  HENT: Negative for congestion and rhinorrhea.   Eyes: Negative for discharge.  Respiratory: Negative for cough and wheezing.   Cardiovascular: Negative for chest pain.  Gastrointestinal: Negative for vomiting, abdominal pain and blood in stool.  Genitourinary: Negative for frequency and difficulty urinating.  Musculoskeletal: Negative for neck pain.  Skin: Negative for rash.  Allergic/Immunologic: Negative for environmental allergies and food allergies.  Neurological: Negative for weakness and headaches.  Psychiatric/Behavioral: Negative for agitation.       Objective:   Physical Exam  Constitutional: He appears well-nourished. No distress.  Cardiovascular: Normal rate, regular rhythm and normal heart sounds.   No murmur heard. Pulmonary/Chest: Effort normal and breath sounds normal. No respiratory distress.  Musculoskeletal: He exhibits no edema.  Lymphadenopathy:    He has no cervical adenopathy.  Neurological: He is alert.  Psychiatric: His behavior is normal.  Vitals reviewed.     Patient states he has had tick bite he denies any other particular troubles.    Assessment & Plan:  Significant myalgias and  fatigue we need to do some lab work await the results of this. We also need for the patient to stay at home next few days avoid the heat keep well-hydrated start doxycycline twice a day to cover for the possibility tick related illness

## 2014-08-26 LAB — SEDIMENTATION RATE: SED RATE: 2 mm/h (ref 0–15)

## 2014-08-27 LAB — CBC WITH DIFFERENTIAL/PLATELET
BASOS ABS: 0.1 10*3/uL (ref 0.0–0.2)
Basos: 1 %
EOS (ABSOLUTE): 0.2 10*3/uL (ref 0.0–0.4)
EOS: 2 %
HEMOGLOBIN: 15.9 g/dL (ref 12.6–17.7)
Hematocrit: 46.6 % (ref 37.5–51.0)
Immature Grans (Abs): 0 10*3/uL (ref 0.0–0.1)
Immature Granulocytes: 0 %
LYMPHS ABS: 2.4 10*3/uL (ref 0.7–3.1)
Lymphs: 26 %
MCH: 29.9 pg (ref 26.6–33.0)
MCHC: 34.1 g/dL (ref 31.5–35.7)
MCV: 88 fL (ref 79–97)
Monocytes Absolute: 0.5 10*3/uL (ref 0.1–0.9)
Monocytes: 6 %
Neutrophils Absolute: 6 10*3/uL (ref 1.4–7.0)
Neutrophils: 65 %
PLATELETS: 226 10*3/uL (ref 150–379)
RBC: 5.32 x10E6/uL (ref 4.14–5.80)
RDW: 14.7 % (ref 12.3–15.4)
WBC: 9.2 10*3/uL (ref 3.4–10.8)

## 2014-08-27 LAB — CK: Total CK: 93 U/L (ref 24–204)

## 2014-08-27 LAB — BASIC METABOLIC PANEL
BUN/Creatinine Ratio: 9 (ref 9–20)
BUN: 9 mg/dL (ref 6–24)
CO2: 25 mmol/L (ref 18–29)
Calcium: 9.5 mg/dL (ref 8.7–10.2)
Chloride: 100 mmol/L (ref 97–108)
Creatinine, Ser: 0.98 mg/dL (ref 0.76–1.27)
GFR calc non Af Amer: 91 mL/min/{1.73_m2} (ref >59)
GFR, EST AFRICAN AMERICAN: 105 mL/min/{1.73_m2} (ref >59)
Glucose: 68 mg/dL (ref 65–99)
POTASSIUM: 4.3 mmol/L (ref 3.5–5.2)
SODIUM: 140 mmol/L (ref 134–144)

## 2014-08-27 LAB — HEPATIC FUNCTION PANEL
ALT: 22 IU/L (ref 0–44)
AST: 19 IU/L (ref 0–40)
Albumin: 4.3 g/dL (ref 3.5–5.5)
Alkaline Phosphatase: 72 IU/L (ref 39–117)
Bilirubin Total: 0.2 mg/dL (ref 0.0–1.2)
Bilirubin, Direct: 0.08 mg/dL (ref 0.00–0.40)
TOTAL PROTEIN: 6.7 g/dL (ref 6.0–8.5)

## 2014-08-27 LAB — RICKETTSIAL FEVER ABS
Q Fever Phase I: NEGATIVE
Q Fever Phase II: NEGATIVE
RMSF IGG: NEGATIVE

## 2014-09-10 ENCOUNTER — Telehealth: Payer: Self-pay | Admitting: Family Medicine

## 2014-09-10 NOTE — Telephone Encounter (Signed)
unfortunately there may not be anything we can do. In essence his insurance company states to him that if quitting is important enough he will double his money toward Chantix rather then towards cigarettes. He has already tried Wellbutrin in the past and did not do well with side effects. Essentially it is up to the patient to decide what to do. If he decides to be on the medication it will be out of pocket

## 2014-09-29 ENCOUNTER — Other Ambulatory Visit (HOSPITAL_COMMUNITY): Payer: Self-pay | Admitting: Orthopaedic Surgery

## 2014-09-29 DIAGNOSIS — M4716 Other spondylosis with myelopathy, lumbar region: Secondary | ICD-10-CM

## 2014-10-09 ENCOUNTER — Ambulatory Visit (HOSPITAL_COMMUNITY): Payer: Commercial Managed Care - PPO

## 2014-11-28 ENCOUNTER — Ambulatory Visit: Payer: Commercial Managed Care - PPO | Admitting: Family Medicine

## 2015-01-15 ENCOUNTER — Encounter: Payer: Self-pay | Admitting: Family Medicine

## 2015-01-15 NOTE — Telephone Encounter (Signed)
This message was accidentally overlooked.  Mailed letter to patient explaining options and to let us know if his prescription insurance plan changes, we could order again if he gets a new prescription plan

## 2015-02-09 ENCOUNTER — Other Ambulatory Visit: Payer: Self-pay | Admitting: Family Medicine

## 2015-02-10 ENCOUNTER — Other Ambulatory Visit: Payer: Self-pay | Admitting: Family Medicine

## 2015-05-02 ENCOUNTER — Other Ambulatory Visit: Payer: Self-pay | Admitting: Family Medicine

## 2015-06-15 ENCOUNTER — Other Ambulatory Visit: Payer: Self-pay | Admitting: Family Medicine

## 2015-07-03 ENCOUNTER — Encounter: Payer: Self-pay | Admitting: Nurse Practitioner

## 2015-07-03 ENCOUNTER — Ambulatory Visit (INDEPENDENT_AMBULATORY_CARE_PROVIDER_SITE_OTHER): Payer: Commercial Managed Care - PPO | Admitting: Nurse Practitioner

## 2015-07-03 VITALS — BP 122/80 | Ht 70.0 in | Wt 206.5 lb

## 2015-07-03 DIAGNOSIS — I10 Essential (primary) hypertension: Secondary | ICD-10-CM

## 2015-07-03 DIAGNOSIS — K219 Gastro-esophageal reflux disease without esophagitis: Secondary | ICD-10-CM | POA: Diagnosis not present

## 2015-07-03 DIAGNOSIS — E790 Hyperuricemia without signs of inflammatory arthritis and tophaceous disease: Secondary | ICD-10-CM

## 2015-07-03 DIAGNOSIS — E785 Hyperlipidemia, unspecified: Secondary | ICD-10-CM | POA: Diagnosis not present

## 2015-07-03 MED ORDER — PRAVASTATIN SODIUM 40 MG PO TABS: ORAL_TABLET | ORAL | Status: DC

## 2015-07-03 MED ORDER — LISINOPRIL-HYDROCHLOROTHIAZIDE 10-12.5 MG PO TABS: 1.0000 | ORAL_TABLET | Freq: Every day | ORAL | Status: DC

## 2015-07-03 MED ORDER — ALLOPURINOL 100 MG PO TABS: 100.0000 mg | ORAL_TABLET | Freq: Every day | ORAL | Status: DC

## 2015-07-03 MED ORDER — ESOMEPRAZOLE MAGNESIUM 40 MG PO CPDR: DELAYED_RELEASE_CAPSULE | ORAL | Status: DC

## 2015-07-03 MED ORDER — BUPROPION HCL ER (SR) 150 MG PO TB12: 150.0000 mg | ORAL_TABLET | Freq: Two times a day (BID) | ORAL | Status: DC

## 2015-07-03 NOTE — Progress Notes (Signed)
Subjective:  Presents for routine follow-up. Has done well maintaining his weight loss of over 20 pounds. Has not done very well with his diet lately, works out of town and eats out most of the time. Does have a very active job. No chest pain/ischemic type pain or shortness of breath. Has cut back his smoking to 4-5 cigarettes per day. At one point he was smoking 2 packs per day. His insurance would not pay for Chantix to help him quit smoking. Has some be appropriate at home that he plans to take to help with anxiety and smoking cessation. Has not had any flareups of the scalp. Gets regular follow-up with his rheumatologist Dr. Amil Amen who has done lab work recently. Labs are unavailable during office visit. Reflux is stable on Nexium.  Objective:   BP 122/80 mmHg  Ht 5\' 10"  (1.778 m)  Wt 206 lb 8 oz (93.668 kg)  BMI 29.63 kg/m2 NAD. Alert, oriented. Lungs clear. Heart regular rate rhythm. Abdomen soft nondistended nontender. Lower extremities no edema.  Assessment:  Problem List Items Addressed This Visit      Cardiovascular and Mediastinum   HTN (hypertension), benign - Primary   Relevant Medications   lisinopril-hydrochlorothiazide (PRINZIDE,ZESTORETIC) 10-12.5 MG tablet   pravastatin (PRAVACHOL) 40 MG tablet   Other Relevant Orders   Lipid panel   Uric acid     Digestive   GERD   Relevant Medications   esomeprazole (NEXIUM) 40 MG capsule   Other Relevant Orders   Lipid panel   Uric acid     Other   Hyperlipemia   Relevant Medications   lisinopril-hydrochlorothiazide (PRINZIDE,ZESTORETIC) 10-12.5 MG tablet   pravastatin (PRAVACHOL) 40 MG tablet   Other Relevant Orders   Lipid panel   Uric acid   Hyperuricemia   Relevant Orders   Lipid panel   Uric acid     Plan:  Meds ordered this encounter  Medications  . allopurinol (ZYLOPRIM) 100 MG tablet    Sig: Take 1 tablet (100 mg total) by mouth daily.    Dispense:  90 tablet    Refill:  1    Order Specific Question:   Supervising Provider    Answer:  Brian Brewer [[id number]]  . buPROPion (WELLBUTRIN SR) 150 MG 12 hr tablet    Sig: Take 1 tablet (150 mg total) by mouth 2 (two) times daily.    Dispense:  60 tablet    Refill:  5    Order Specific Question:  Supervising Provider    Answer:  Brian Brewer [[id number]]  . esomeprazole (NEXIUM) 40 MG capsule    Sig: TAKE 1 CAPSULE BY MOUTH DAILY BEFORE BREAKFAST.    Dispense:  90 capsule    Refill:  1    Order Specific Question:  Supervising Provider    Answer:  Brian Brewer [[id number]]  . lisinopril-hydrochlorothiazide (PRINZIDE,ZESTORETIC) 10-12.5 MG tablet    Sig: Take 1 tablet by mouth daily.    Dispense:  90 tablet    Refill:  1    Order Specific Question:  Supervising Provider    Answer:  Brian Brewer [[id number]]  . pravastatin (PRAVACHOL) 40 MG tablet    Sig: TAKE 1 TABLET EACH EVENING.    Dispense:  90 tablet    Refill:  1    Order Specific Question:  Supervising Provider    Answer:  Brian Brewer [[id number]]   Encouraged healthy diet as much as possible. Continue current medications. Encouraged smoking cessation. Return  in about 6 months (around 01/02/2016) for recheck.

## 2015-07-05 LAB — LIPID PANEL
Chol/HDL Ratio: 7.5 ratio units — ABNORMAL HIGH (ref 0.0–5.0)
Cholesterol, Total: 149 mg/dL (ref 100–199)
HDL: 20 mg/dL — ABNORMAL LOW (ref >39)
LDL Calculated: 68 mg/dL (ref 0–99)
Triglycerides: 303 mg/dL — ABNORMAL HIGH (ref 0–149)
VLDL Cholesterol Cal: 61 mg/dL — ABNORMAL HIGH (ref 5–40)

## 2015-07-05 LAB — URIC ACID: Uric Acid: 6.2 mg/dL (ref 3.7–8.6)

## 2015-08-22 ENCOUNTER — Other Ambulatory Visit: Payer: Self-pay | Admitting: Nurse Practitioner

## 2015-10-26 ENCOUNTER — Encounter: Payer: Self-pay | Admitting: Family Medicine

## 2015-10-26 ENCOUNTER — Ambulatory Visit (INDEPENDENT_AMBULATORY_CARE_PROVIDER_SITE_OTHER): Payer: Commercial Managed Care - PPO | Admitting: Family Medicine

## 2015-10-26 VITALS — BP 130/82 | Temp 98.4°F | Ht 70.0 in | Wt 190.5 lb

## 2015-10-26 DIAGNOSIS — J019 Acute sinusitis, unspecified: Secondary | ICD-10-CM

## 2015-10-26 DIAGNOSIS — B9689 Other specified bacterial agents as the cause of diseases classified elsewhere: Secondary | ICD-10-CM

## 2015-10-26 MED ORDER — CEFPROZIL 500 MG PO TABS: 500.0000 mg | ORAL_TABLET | Freq: Two times a day (BID) | ORAL | 0 refills | Status: DC

## 2015-10-26 NOTE — Progress Notes (Signed)
   Subjective:    Patient ID: Brian Brewer, male    DOB: 1965/11/07, 50 y.o.   MRN: [id number]  Sinusitis  This is a new problem. The current episode started in the past 7 days. Associated symptoms include congestion, coughing, headaches and a sore throat. Pertinent negatives include no ear pain. Treatments tried: ibuprofen, allergy medication.   Patient still smokes he knows he needs quit Patient was sinus congestion drainage coughing sinus pressure not feeling good Denies high fever chills sweats or wheezing   Review of Systems  Constitutional: Negative for activity change and fever.  HENT: Positive for congestion, rhinorrhea and sore throat. Negative for ear pain.   Eyes: Negative for discharge.  Respiratory: Positive for cough. Negative for wheezing.   Cardiovascular: Negative for chest pain.  Neurological: Positive for headaches.       Objective:   Physical Exam  Constitutional: He appears well-developed.  HENT:  Head: Normocephalic.  Mouth/Throat: Oropharynx is clear and moist. No oropharyngeal exudate.  Neck: Normal range of motion.  Cardiovascular: Normal rate, regular rhythm and normal heart sounds.   No murmur heard. Pulmonary/Chest: Effort normal and breath sounds normal. He has no wheezes.  Lymphadenopathy:    He has no cervical adenopathy.  Neurological: He exhibits normal muscle tone.  Skin: Skin is warm and dry.  Nursing note and vitals reviewed.         Assessment & Plan:  Patient was seen today for upper respiratory illness. It is felt that the patient is dealing with sinusitis. Antibiotics were prescribed today. Importance of compliance with medication was discussed. Symptoms should gradually resolve over the course of the next several days. If high fevers, progressive illness, difficulty breathing, worsening condition or failure for symptoms to improve over the next several days then the patient is to follow-up. If any emergent conditions the patient is  to follow-up in the emergency department otherwise to follow-up in the office. Patient counseled to quit smoking he will try Wellbutrin if that doesn't work he will call back in tried Chantix in the past Chantix causes nausea for him. Patient was warned about possibility of suicidal ideation with Chantix and the need to quit Chantix at that caused. For now he will use Wellbutrin

## 2015-11-23 ENCOUNTER — Other Ambulatory Visit: Payer: Self-pay | Admitting: Nurse Practitioner

## 2015-12-21 ENCOUNTER — Other Ambulatory Visit: Payer: Self-pay | Admitting: Nurse Practitioner

## 2016-01-01 ENCOUNTER — Ambulatory Visit: Payer: Commercial Managed Care - PPO | Admitting: Family Medicine

## 2016-01-14 ENCOUNTER — Other Ambulatory Visit: Payer: Self-pay | Admitting: Family Medicine

## 2016-01-17 ENCOUNTER — Telehealth: Payer: Self-pay | Admitting: Family Medicine

## 2016-01-17 NOTE — Telephone Encounter (Signed)
I received a request from his orthopedist if he could stop his aspirin 1 week before surgery for the shoulder. Please let the patient know that he can stop his aspirin 1 week before surgery as requested by Dr. Theda Sers. The risk of stopping the aspirin is minimal. Given this surgery it is necessary for him to stop it one week beforehand. We will send a fax to Dr. Theda Sers

## 2016-01-18 NOTE — Telephone Encounter (Signed)
Left message to return call 

## 2016-01-22 MED ORDER — LISINOPRIL-HYDROCHLOROTHIAZIDE 10-12.5 MG PO TABS
1.0000 | ORAL_TABLET | Freq: Every day | ORAL | 1 refills | Status: DC
Start: 2016-01-22 — End: 2016-07-15

## 2016-01-22 MED ORDER — ESOMEPRAZOLE MAGNESIUM 40 MG PO CPDR: DELAYED_RELEASE_CAPSULE | ORAL | 1 refills | Status: DC

## 2016-01-22 MED ORDER — ALLOPURINOL 100 MG PO TABS: 100.0000 mg | ORAL_TABLET | Freq: Every day | ORAL | 1 refills | Status: DC

## 2016-01-22 NOTE — Telephone Encounter (Signed)
Notified patient Dr. Nicki Reaper received a request from his orthopedist if he could stop his aspirin 1 week before surgery for the shoulder. Told patient he can stop his aspirin 1 week before surgery as requested by Dr. Theda Sers. The risk of stopping the aspirin is minimal. Given this surgery it is necessary for him to stop it one week beforehand. We will send a fax to Dr. Theda Sers. Patient states that Dr. Theda Sers office already notified him of this. Patient needs meds sent to pharmacy for 90 day supply. Meds sent in.

## 2016-01-22 NOTE — Telephone Encounter (Signed)
Left message to return call 

## 2016-01-22 NOTE — Addendum Note (Signed)
Addended by: Jesusita Oka on: 01/22/2016 11:39 AM   Modules accepted: Orders

## 2016-05-04 ENCOUNTER — Telehealth: Payer: Self-pay | Admitting: Family Medicine

## 2016-05-04 MED ORDER — ONDANSETRON HCL 8 MG PO TABS: 8.0000 mg | ORAL_TABLET | Freq: Three times a day (TID) | ORAL | 0 refills | Status: DC | PRN

## 2016-05-04 NOTE — Telephone Encounter (Signed)
Spoke with patient and informed him per Dr.Scott Luking- we sent in zofran to [facility]. If any abdominal pain, bloody stools or fevers or vomiting will need to be checked. Patient verbalized understanding.

## 2016-05-04 NOTE — Telephone Encounter (Signed)
Patient is requesting a call back when this is called in.

## 2016-05-04 NOTE — Telephone Encounter (Signed)
Pt is needing something called in for nausea.    [place]

## 2016-05-04 NOTE — Telephone Encounter (Signed)
Spoke with patient and he stated that he has had generalized abdominal pain with an onset of Sunday. States abdominal pain is resolving but he has had diarrhea with nausea. Denies fever. Would like a medication for nausea. Please advise?

## 2016-05-04 NOTE — Telephone Encounter (Signed)
Zofran 8 mg tablet, one 3 times a day when necessary nausea, #15-if he has persistent abdominal pain bloody stools fevers or vomiting he needs get rechecked right away

## 2016-05-17 ENCOUNTER — Other Ambulatory Visit: Payer: Self-pay | Admitting: Family Medicine

## 2016-07-13 ENCOUNTER — Other Ambulatory Visit: Payer: Self-pay | Admitting: Family Medicine

## 2016-07-13 NOTE — Telephone Encounter (Signed)
He may have a 30 day prescription needs office visit

## 2016-07-15 ENCOUNTER — Other Ambulatory Visit: Payer: Self-pay | Admitting: Family Medicine

## 2016-08-13 ENCOUNTER — Other Ambulatory Visit: Payer: Self-pay | Admitting: Family Medicine

## 2016-09-05 ENCOUNTER — Ambulatory Visit (INDEPENDENT_AMBULATORY_CARE_PROVIDER_SITE_OTHER): Payer: Commercial Managed Care - PPO | Admitting: Family Medicine

## 2016-09-05 ENCOUNTER — Encounter: Payer: Self-pay | Admitting: Family Medicine

## 2016-09-05 VITALS — BP 110/70 | Ht 70.0 in | Wt 203.8 lb

## 2016-09-05 DIAGNOSIS — E784 Other hyperlipidemia: Secondary | ICD-10-CM

## 2016-09-05 DIAGNOSIS — I10 Essential (primary) hypertension: Secondary | ICD-10-CM

## 2016-09-05 DIAGNOSIS — K219 Gastro-esophageal reflux disease without esophagitis: Secondary | ICD-10-CM | POA: Diagnosis not present

## 2016-09-05 DIAGNOSIS — E790 Hyperuricemia without signs of inflammatory arthritis and tophaceous disease: Secondary | ICD-10-CM

## 2016-09-05 DIAGNOSIS — E7849 Other hyperlipidemia: Secondary | ICD-10-CM

## 2016-09-05 DIAGNOSIS — Z125 Encounter for screening for malignant neoplasm of prostate: Secondary | ICD-10-CM

## 2016-09-05 MED ORDER — LISINOPRIL-HYDROCHLOROTHIAZIDE 10-12.5 MG PO TABS: 1.0000 | ORAL_TABLET | Freq: Every day | ORAL | 1 refills | Status: DC

## 2016-09-05 MED ORDER — PRAVASTATIN SODIUM 40 MG PO TABS: ORAL_TABLET | ORAL | 1 refills | Status: DC

## 2016-09-05 MED ORDER — ALLOPURINOL 100 MG PO TABS: 100.0000 mg | ORAL_TABLET | Freq: Every day | ORAL | 1 refills | Status: DC

## 2016-09-05 MED ORDER — ESOMEPRAZOLE MAGNESIUM 40 MG PO CPDR: DELAYED_RELEASE_CAPSULE | ORAL | 1 refills | Status: DC

## 2016-09-05 NOTE — Progress Notes (Signed)
   Subjective:    Patient ID: Brian Brewer, male    DOB: [birth year], 51 y.o.   MRN: [id number]  Hypertension  This is a chronic problem. The current episode started more than 1 year ago. Pertinent negatives include no chest pain. Risk factors for coronary artery disease include male gender. Treatments tried: lisisnopril/hctz. There are no compliance problems.   Patient denies any type of chest tightness pressure pain shortness breath  He states gout under good control no flareups recently Takes his reflux medicine and cholesterol medicine without difficulty Still smokes is cut down to 10 cigarettes a day he is hoping to quit he is tried medicines before without success does not want to try them again   Review of Systems  Constitutional: Negative for activity change, appetite change and fatigue.  HENT: Negative for congestion.   Respiratory: Negative for cough.   Cardiovascular: Negative for chest pain.  Gastrointestinal: Negative for abdominal pain.  Endocrine: Negative for polydipsia and polyphagia.  Neurological: Negative for weakness.  Psychiatric/Behavioral: Negative for confusion.       Objective:   Physical Exam  Constitutional: He appears well-nourished. No distress.  Cardiovascular: Normal rate, regular rhythm and normal heart sounds.   No murmur heard. Pulmonary/Chest: Effort normal and breath sounds normal. No respiratory distress.  Musculoskeletal: He exhibits no edema.  Lymphadenopathy:    He has no cervical adenopathy.  Neurological: He is alert.  Psychiatric: His behavior is normal.  Vitals reviewed.         Assessment & Plan:  HTN good control continue current measures  Hyperlipidemia previous labs reviewed As ordered refills given  Gout no flareups recently continue medication lab work ordered  Reflux under good control medication  Patient counseled to quit smoking  Wellness exam within the native next 8 weeks lab work ordered

## 2016-11-03 LAB — PSA: Prostate Specific Ag, Serum: 0.8 ng/mL (ref 0.0–4.0)

## 2016-11-03 LAB — BASIC METABOLIC PANEL
BUN/Creatinine Ratio: 12 (ref 9–20)
BUN: 14 mg/dL (ref 6–24)
CALCIUM: 9.9 mg/dL (ref 8.7–10.2)
CHLORIDE: 101 mmol/L (ref 96–106)
CO2: 23 mmol/L (ref 20–29)
Creatinine, Ser: 1.2 mg/dL (ref 0.76–1.27)
GFR calc Af Amer: 81 mL/min/{1.73_m2} (ref >59)
GFR calc non Af Amer: 70 mL/min/{1.73_m2} (ref >59)
GLUCOSE: 117 mg/dL — AB (ref 65–99)
POTASSIUM: 4.6 mmol/L (ref 3.5–5.2)
SODIUM: 138 mmol/L (ref 134–144)

## 2016-11-03 LAB — HEPATIC FUNCTION PANEL
ALT: 19 IU/L (ref 0–44)
AST: 20 IU/L (ref 0–40)
Albumin: 4.4 g/dL (ref 3.5–5.5)
Alkaline Phosphatase: 76 IU/L (ref 39–117)
BILIRUBIN TOTAL: 0.3 mg/dL (ref 0.0–1.2)
Bilirubin, Direct: 0.08 mg/dL (ref 0.00–0.40)
Total Protein: 6.9 g/dL (ref 6.0–8.5)

## 2016-11-03 LAB — LIPID PANEL
CHOL/HDL RATIO: 4.3 ratio (ref 0.0–5.0)
CHOLESTEROL TOTAL: 128 mg/dL (ref 100–199)
HDL: 30 mg/dL — AB (ref >39)
LDL CALC: 74 mg/dL (ref 0–99)
TRIGLYCERIDES: 121 mg/dL (ref 0–149)
VLDL CHOLESTEROL CAL: 24 mg/dL (ref 5–40)

## 2016-11-03 LAB — URIC ACID: URIC ACID: 6 mg/dL (ref 3.7–8.6)

## 2016-11-07 ENCOUNTER — Encounter: Payer: Self-pay | Admitting: Family Medicine

## 2016-11-07 ENCOUNTER — Ambulatory Visit (INDEPENDENT_AMBULATORY_CARE_PROVIDER_SITE_OTHER): Payer: Commercial Managed Care - PPO | Admitting: Family Medicine

## 2016-11-07 VITALS — BP 128/72 | Ht 69.0 in | Wt 196.0 lb

## 2016-11-07 DIAGNOSIS — E7849 Other hyperlipidemia: Secondary | ICD-10-CM

## 2016-11-07 DIAGNOSIS — Z1211 Encounter for screening for malignant neoplasm of colon: Secondary | ICD-10-CM

## 2016-11-07 DIAGNOSIS — I10 Essential (primary) hypertension: Secondary | ICD-10-CM | POA: Diagnosis not present

## 2016-11-07 DIAGNOSIS — Z0000 Encounter for general adult medical examination without abnormal findings: Secondary | ICD-10-CM

## 2016-11-07 NOTE — Progress Notes (Signed)
   Subjective:    Patient ID: Brian Brewer, male    DOB: Apr 03, 1965, 51 y.o.   MRN: [id number]  HPI The patient comes in today for a wellness visit.  Long discussion today with patient about quitting smoking he would like to try Nicorette gum versus nicotine patches he does not want to be on any type of medicine currently but we will considered Wellbutrin  He understands he needs to quit smoking to reduce her risk of cancer COPD heart attacks and strokes  Patient is cared for by rheumatology for psoriatic arthritis Just recently seen for chronic health issues here  A review of their health history was completed.  A review of medications was also completed.  Any needed refills; none  Eating habits: health conscious  Falls/  MVA accidents in past few months: none  Regular exercise: walks a lot  Specialist pt sees on regular basis: [facility] for psoriatic  arthritis  Preventative health issues were discussed.   Additional concerns: none  Declines flu vaccine.    Review of Systems  Constitutional: Negative for activity change, appetite change and fever.  HENT: Negative for congestion and rhinorrhea.   Eyes: Negative for discharge.  Respiratory: Negative for cough and wheezing.   Cardiovascular: Negative for chest pain.  Gastrointestinal: Negative for abdominal pain, blood in stool and vomiting.  Genitourinary: Negative for difficulty urinating and frequency.  Musculoskeletal: Negative for neck pain.  Skin: Negative for rash.  Allergic/Immunologic: Negative for environmental allergies and food allergies.  Neurological: Negative for weakness and headaches.  Psychiatric/Behavioral: Negative for agitation.       Objective:   Physical Exam  Constitutional: He appears well-developed and well-nourished.  HENT:  Head: Normocephalic and atraumatic.  Right Ear: External ear normal.  Left Ear: External ear normal.  Nose: Nose normal.  Mouth/Throat: Oropharynx  is clear and moist.  Eyes: Pupils are equal, round, and reactive to light. EOM are normal.  Neck: Normal range of motion. Neck supple. No thyromegaly present.  Cardiovascular: Normal rate, regular rhythm and normal heart sounds.   No murmur heard. Pulmonary/Chest: Effort normal and breath sounds normal. No respiratory distress. He has no wheezes.  Abdominal: Soft. Bowel sounds are normal. He exhibits no distension and no mass. There is no tenderness.  Genitourinary: Rectum normal, prostate normal and penis normal.  Musculoskeletal: Normal range of motion. He exhibits no edema.  Lymphadenopathy:    He has no cervical adenopathy.  Neurological: He is alert. He exhibits normal muscle tone.  Skin: Skin is warm and dry. No erythema.  Psychiatric: He has a normal mood and affect. His behavior is normal. Judgment normal.     Labs were reviewed in detail     Assessment & Plan:  Adult wellness-complete.wellness physical was conducted today. Importance of diet and exercise were discussed in detail. In addition to this a discussion regarding safety was also covered. We also reviewed over immunizations and gave recommendations regarding current immunization needed for age. In addition to this additional areas were also touched on including: Preventative health exams needed: Colonoscopy now referral today  Patient was advised yearly wellness exam  Patients chronic health issues was addressed on last visit a couple months ago  He does have psoriatic arthritis that is being cared for by Dr. Amil Amen rheumatology with Humira shots  Patient was advised to speak with Dr. Amil Amen regarding flu vaccine with this medicine  Follow-up in the spring for blood pressure cholesterol

## 2016-11-14 ENCOUNTER — Telehealth: Payer: Self-pay

## 2016-11-14 NOTE — Telephone Encounter (Signed)
[phone] PLEASE CALL PATIENT, RECEIVED LETTER TO SCHEDULE TCS, NO CURRENT GI ISSUES, TAKES ONLY A BABY ASPIRIN

## 2016-11-17 ENCOUNTER — Telehealth: Payer: Self-pay

## 2016-11-17 NOTE — Telephone Encounter (Signed)
See separate triage.  

## 2016-11-28 NOTE — Telephone Encounter (Signed)
Gastroenterology Pre-Procedure Review  Request Date: 11/17/2016 Requesting Physician: Dr. Sallee Lange  PATIENT REVIEW QUESTIONS: The patient responded to the following health history questions as indicated:    1. Diabetes Melitis: no 2. Joint replacements in the past 12 months: no 3. Major health problems in the past 3 months: no 4. Has an artificial valve or MVP: no 5. Has a defibrillator: no 6. Has been advised in past to take antibiotics in advance of a procedure like teeth cleaning: no 7. Family history of colon cancer: no  8. Alcohol Use: no 9. History of sleep apnea: no  10. History of coronary artery or other vascular stents placed within the last 12 months: no 11. History of any prior anesthesia complications: no    MEDICATIONS & ALLERGIES:    Patient reports the following regarding taking any blood thinners:   Plavix? no Aspirin? no Coumadin? no Brilinta? no Xarelto? no Eliquis? no Pradaxa? no Savaysa? no Effient? no  Patient confirms/reports the following medications:  Current Outpatient Prescriptions  Medication Sig Dispense Refill  . allopurinol (ZYLOPRIM) 100 MG tablet Take 1 tablet (100 mg total) by mouth daily. 90 tablet 1  . aspirin EC 81 MG tablet Take 81 mg by mouth daily.      Marland Kitchen esomeprazole (NEXIUM) 40 MG capsule TAKE 1 CAPSULE BY MOUTH DAILY BEFORE BREAKFAST. 90 capsule 1  . lisinopril-hydrochlorothiazide (PRINZIDE,ZESTORETIC) 10-12.5 MG tablet Take 1 tablet by mouth daily. 90 tablet 1  . pravastatin (PRAVACHOL) 40 MG tablet TAKE 1 TABLET EACH EVENING. 90 tablet 1  . potassium chloride (K-DUR) 10 MEQ tablet Take 1 tablet (10 mEq total) by mouth daily. (Patient not taking: Reported on 10/26/2015) 30 tablet 2   No current facility-administered medications for this visit.     Patient confirms/reports the following allergies:  Allergies  Allergen Reactions  . Amoxicillin-Pot Clavulanate Other (See Comments)    Cramps and diarrhea  . Levofloxacin Other  (See Comments)    Passed out (possibly due to medication)    No orders of the defined types were placed in this encounter.   AUTHORIZATION INFORMATION Primary Insurance:   ID #:   Group #:  Pre-Cert / Auth required:  Pre-Cert / Auth #:   Secondary Insurance:   ID #:  Group #:  Pre-Cert / Auth required:  Pre-Cert / Auth #:   SCHEDULE INFORMATION: Procedure has been scheduled as follows:  Date:  12/29/2016             Time: 9:15 Am Location: [facility] Short Stay  This Gastroenterology Pre-Precedure Review Form is being routed to the following provider(s): R. Garfield Cornea, MD

## 2016-11-29 NOTE — Telephone Encounter (Signed)
Appropriate.

## 2016-12-01 ENCOUNTER — Other Ambulatory Visit: Payer: Self-pay

## 2016-12-01 DIAGNOSIS — Z1211 Encounter for screening for malignant neoplasm of colon: Secondary | ICD-10-CM

## 2016-12-01 MED ORDER — PEG 3350-KCL-NA BICARB-NACL 420 G PO SOLR
4000.0000 mL | ORAL | 0 refills | Status: DC
Start: 2016-12-01 — End: 2017-05-08

## 2016-12-01 NOTE — Telephone Encounter (Signed)
Rx sent to the pharmacy and instructions mailed to pt.  

## 2016-12-29 ENCOUNTER — Encounter (HOSPITAL_COMMUNITY): Payer: Self-pay | Admitting: *Deleted

## 2016-12-29 ENCOUNTER — Ambulatory Visit (HOSPITAL_COMMUNITY)
Admission: RE | Admit: 2016-12-29 | Discharge: 2016-12-29 | Disposition: A | Discharge status: Home / Self Care | Payer: Commercial Managed Care - PPO | Source: Ambulatory Visit | Attending: Internal Medicine | Admitting: Internal Medicine

## 2016-12-29 ENCOUNTER — Other Ambulatory Visit: Payer: Self-pay

## 2016-12-29 ENCOUNTER — Encounter (HOSPITAL_COMMUNITY)
Admission: RE | Disposition: A | Discharge status: Home / Self Care | Payer: Self-pay | Source: Ambulatory Visit | Attending: Internal Medicine

## 2016-12-29 DIAGNOSIS — I10 Essential (primary) hypertension: Secondary | ICD-10-CM | POA: Diagnosis not present

## 2016-12-29 DIAGNOSIS — M109 Gout, unspecified: Secondary | ICD-10-CM | POA: Diagnosis not present

## 2016-12-29 DIAGNOSIS — Z981 Arthrodesis status: Secondary | ICD-10-CM | POA: Insufficient documentation

## 2016-12-29 DIAGNOSIS — Z79899 Other long term (current) drug therapy: Secondary | ICD-10-CM | POA: Diagnosis not present

## 2016-12-29 DIAGNOSIS — Z7982 Long term (current) use of aspirin: Secondary | ICD-10-CM | POA: Diagnosis not present

## 2016-12-29 DIAGNOSIS — F17210 Nicotine dependence, cigarettes, uncomplicated: Secondary | ICD-10-CM | POA: Diagnosis not present

## 2016-12-29 DIAGNOSIS — K5730 Diverticulosis of large intestine without perforation or abscess without bleeding: Secondary | ICD-10-CM | POA: Insufficient documentation

## 2016-12-29 DIAGNOSIS — Z8249 Family history of ischemic heart disease and other diseases of the circulatory system: Secondary | ICD-10-CM | POA: Diagnosis not present

## 2016-12-29 DIAGNOSIS — Z1211 Encounter for screening for malignant neoplasm of colon: Secondary | ICD-10-CM

## 2016-12-29 DIAGNOSIS — Z881 Allergy status to other antibiotic agents status: Secondary | ICD-10-CM | POA: Diagnosis not present

## 2016-12-29 DIAGNOSIS — K635 Polyp of colon: Secondary | ICD-10-CM | POA: Insufficient documentation

## 2016-12-29 DIAGNOSIS — K219 Gastro-esophageal reflux disease without esophagitis: Secondary | ICD-10-CM | POA: Diagnosis not present

## 2016-12-29 DIAGNOSIS — Z880 Allergy status to penicillin: Secondary | ICD-10-CM | POA: Insufficient documentation

## 2016-12-29 DIAGNOSIS — E785 Hyperlipidemia, unspecified: Secondary | ICD-10-CM | POA: Diagnosis not present

## 2016-12-29 HISTORY — PX: COLONOSCOPY: SHX5424

## 2016-12-29 SURGERY — COLONOSCOPY
Anesthesia: Moderate Sedation

## 2016-12-29 MED ORDER — SODIUM CHLORIDE 0.9 % IV SOLN
INTRAVENOUS | Status: AC | PRN
  Administered 2016-12-29: 1000 mL via INTRAVENOUS

## 2016-12-29 MED ORDER — MIDAZOLAM HCL 5 MG/5ML IJ SOLN
INTRAMUSCULAR | Status: DC | PRN
  Administered 2016-12-29: 2 mg via INTRAVENOUS
  Administered 2016-12-29: 1 mg via INTRAVENOUS
  Administered 2016-12-29: 2 mg via INTRAVENOUS
  Administered 2016-12-29: 1 mg via INTRAVENOUS

## 2016-12-29 MED ORDER — ONDANSETRON HCL 4 MG/2ML IJ SOLN
INTRAMUSCULAR | Status: DC | PRN
  Administered 2016-12-29: 4 mg via INTRAVENOUS

## 2016-12-29 MED ORDER — MIDAZOLAM HCL 5 MG/5ML IJ SOLN
INTRAMUSCULAR | Status: AC
  Filled 2016-12-29: qty 10

## 2016-12-29 MED ORDER — MEPERIDINE HCL 100 MG/ML IJ SOLN
INTRAMUSCULAR | Status: AC
  Filled 2016-12-29: qty 2

## 2016-12-29 MED ORDER — SIMETHICONE 40 MG/0.6ML PO SUSP
ORAL | Status: DC | PRN
  Administered 2016-12-29: 09:00:00

## 2016-12-29 MED ORDER — MEPERIDINE HCL 100 MG/ML IJ SOLN
INTRAMUSCULAR | Status: DC | PRN
  Administered 2016-12-29 (×2): 50 mg via INTRAVENOUS
  Administered 2016-12-29: 25 mg via INTRAVENOUS

## 2016-12-29 MED ORDER — ONDANSETRON HCL 4 MG/2ML IJ SOLN
INTRAMUSCULAR | Status: AC
  Filled 2016-12-29: qty 2

## 2016-12-29 NOTE — Op Note (Signed)
[facility] Patient Name: Brian Brewer Procedure Date: 12/29/2016 9:02 AM MRN: [id number] Date of Birth: Nov 27, 1965 Attending MD: Norvel Richards , MD CSN: [id number] Age: 51 Admit Type: Outpatient Procedure:                Colonoscopy Indications:              Screening for colorectal malignant neoplasm Providers:                Norvel Richards, MD, Janeece Riggers, RN, Hinton Rao, RN Referring MD:              Medicines:                Midazolam 6 mg IV, Meperidine 125 mg IV,                            Ondansetron 4 mg IV Complications:            No immediate complications. Estimated Blood Loss:     Estimated blood loss was minimal. Procedure:                Pre-Anesthesia Assessment:                           - Prior to the procedure, a History and Physical                            was performed, and patient medications and                            allergies were reviewed. The patient's tolerance of                            previous anesthesia was also reviewed. The risks                            and benefits of the procedure and the sedation                            options and risks were discussed with the patient.                            All questions were answered, and informed consent                            was obtained. Prior Anticoagulants: The patient has                            taken no previous anticoagulant or antiplatelet                            agents. ASA Grade Assessment: II - A patient with  mild systemic disease. After reviewing the risks                            and benefits, the patient was deemed in                            satisfactory condition to undergo the procedure.                           After obtaining informed consent, the colonoscope                            was passed under direct vision. Throughout the                            procedure, the  patient's blood pressure, pulse, and                            oxygen saturations were monitored continuously. The                            EC-[id number] ([id number]) scope was introduced through                            the anus and advanced to the the cecum, identified                            by appendiceal orifice and ileocecal valve. The                            colonoscopy was performed without difficulty. The                            patient tolerated the procedure well. The quality                            of the bowel preparation was adequate. The                            ileocecal valve, appendiceal orifice, and rectum                            were photographed. The entire colon was well                            visualized. Scope In: 9:21:04 AM Scope Out: 9:35:11 AM Scope Withdrawal Time: 0 hours 8 minutes 42 seconds  Total Procedure Duration: 0 hours 14 minutes 7 seconds  Findings:      The perianal and digital rectal examinations were normal.      Scattered small-mouthed diverticula were found in the sigmoid colon.      A 4 mm polyp was found in the sigmoid colon. The polyp was sessile. The       polyp was removed with a cold snare. Resection and retrieval were  complete. Estimated blood loss was minimal.      The exam was otherwise without abnormality on direct and retroflexion       views. Impression:               - Diverticulosis in the sigmoid colon.                           - One 4 mm polyp in the sigmoid colon, removed with                            a cold snare. Resected and retrieved.                           - The examination was otherwise normal on direct                            and retroflexion views. Moderate Sedation:      Moderate (conscious) sedation was administered by the endoscopy nurse       and supervised by the endoscopist. The following parameters were       monitored: oxygen saturation, heart rate, blood pressure, respiratory        rate, EKG, adequacy of pulmonary ventilation, and response to care.       Total physician intraservice time was 16 minutes. Recommendation:           - The signs and symptoms of potential delayed                            complications were discussed with the patient.                           - Patient has a contact number available for                            emergencies.                           - Return to normal activities tomorrow.                           - Resume previous diet.                           - Continue present medications.                           - Repeat colonoscopy date to be determined after                            pending pathology results are reviewed for                            surveillance based on pathology results.                           - Return to GI clinic (date not yet determined). Procedure Code(s):        ---  Professional ---                           [phone], Colonoscopy, flexible; with removal of                            tumor(s), polyp(s), or other lesion(s) by snare                            technique                           99152, Moderate sedation services provided by the                            same physician or other qualified health care                            professional performing the diagnostic or                            therapeutic service that the sedation supports,                            requiring the presence of an independent trained                            observer to assist in the monitoring of the                            patient's level of consciousness and physiological                            status; initial 15 minutes of intraservice time,                            patient age 71 years or older Diagnosis Code(s):        --- Professional ---                           Z12.11, Encounter for screening for malignant                            neoplasm of colon                            D12.5, Benign neoplasm of sigmoid colon                           K57.30, Diverticulosis of large intestine without                            perforation or abscess without bleeding CPT copyright 2016 American Medical Association. All rights reserved. The codes documented in this report are preliminary and upon coder review may  be revised to meet current compliance requirements. Cristopher Estimable.  Briyan Kleven, MD Norvel Richards, MD 12/29/2016 9:45:17 AM This report has been signed electronically. Number of Addenda: 0

## 2016-12-29 NOTE — H&P (Signed)
@LOGO @   Primary Care Physician:  Kathyrn Drown, MD Primary Gastroenterologist:  Dr. Gala Romney  Pre-Procedure History & Physical: HPI:  Brian Brewer is a 51 y.o. male here for first ever screening colonoscopy. No bowel symptoms. No family history of colon cancer.  Past Medical History:  Diagnosis Date  . GERD (gastroesophageal reflux disease)   . Gout   . Hyperlipemia   . Hypertension     Past Surgical History:  Procedure Laterality Date  . BACK SURGERY  2003   L4,5,S1 Fusion  . CHOLECYSTECTOMY    . LUMBAR FUSION     3 yrs ago-[place]    Prior to Admission medications   Medication Sig Start Date End Date Taking? Authorizing Provider  allopurinol (ZYLOPRIM) 100 MG tablet Take 1 tablet (100 mg total) by mouth daily. 09/05/16  Yes Kathyrn Drown, MD  aspirin EC 81 MG tablet Take 81 mg by mouth daily.     Yes [provider]  esomeprazole (NEXIUM) 40 MG capsule TAKE 1 CAPSULE BY MOUTH DAILY BEFORE BREAKFAST. 09/05/16  Yes Luking, Scott A, MD  HUMIRA PEN 40 MG/0.8ML PNKT Inject 40 mg into the skin every 14 (fourteen) days. 11/05/16  Yes [provider]  lisinopril-hydrochlorothiazide (PRINZIDE,ZESTORETIC) 10-12.5 MG tablet Take 1 tablet by mouth daily. 09/05/16  Yes Luking, Elayne Snare, MD  polyethylene glycol-electrolytes (TRILYTE) 420 g solution Take 4,000 mLs by mouth as directed. 12/01/16  Yes Annitta Needs, NP  pravastatin (PRAVACHOL) 40 MG tablet TAKE 1 TABLET EACH EVENING. 09/05/16  Yes Kathyrn Drown, MD  potassium chloride (K-DUR) 10 MEQ tablet Take 1 tablet (10 mEq total) by mouth daily. Patient not taking: Reported on 10/26/2015 02/14/14   Nilda Simmer, NP    Allergies as of 12/01/2016 - Review Complete 11/17/2016  Allergen Reaction Noted  . Amoxicillin-pot clavulanate Other (See Comments)   . Levofloxacin Other (See Comments)     Family History  Problem Relation Age of Onset  . Heart disease Father   . Hypertension Father   . Leukemia Father   .  Anesthesia problems Neg Hx   . Hypotension Neg Hx   . Malignant hyperthermia Neg Hx   . Pseudochol deficiency Neg Hx     Social History   Socioeconomic History  . Marital status: Married    Spouse name: Not on file  . Number of children: Not on file  . Years of education: Not on file  . Highest education level: Not on file  Social Needs  . Financial resource strain: Not on file  . Food insecurity - worry: Not on file  . Food insecurity - inability: Not on file  . Transportation needs - medical: Not on file  . Transportation needs - non-medical: Not on file  Occupational History  . Not on file  Tobacco Use  . Smoking status: Current Every Day Smoker    Packs/day: 0.50    Years: 20.00    Pack years: 10.00    Types: Cigarettes  . Smokeless tobacco: Never Used  Substance and Sexual Activity  . Alcohol use: No  . Drug use: No  . Sexual activity: Yes    Birth control/protection: None  Other Topics Concern  . Not on file  Social History Narrative  . Not on file    Review of Systems: See HPI, otherwise negative ROS  Physical Exam: BP 119/74   Pulse 67   Temp 98.9 F (37.2 C) (Oral)   Resp (!) 24  Ht 5\' 10"  (1.778 m)   Wt 188 lb (85.3 kg)   SpO2 98%   BMI 26.98 kg/m  General:   Alert,  Well-developed, well-nourished, pleasant and cooperative in NAD Neck:  Supple; no masses or thyromegaly. No significant cervical adenopathy. Lungs:  Clear throughout to auscultation.   No wheezes, crackles, or rhonchi. No acute distress. Heart:  Regular rate and rhythm; no murmurs, clicks, rubs,  or gallops. Abdomen: Non-distended, normal bowel sounds.  Soft and nontender without appreciable mass or hepatosplenomegaly.  Pulses:  Normal pulses noted. Extremities:  Without clubbing or edema.  Impression:  Pleasant 51 year old gentleman presents with her shower or screening colonoscopy. Recommendations:  I have offered the patient a screening colonoscopy today. The risks, benefits,  limitations, alternatives and imponderables have been reviewed with the patient. Questions have been answered. All parties are agreeable.                          Notice: This dictation was prepared with Dragon dictation along with smaller phrase technology. Any transcriptional errors that result from this process are unintentional and may not be corrected upon review.

## 2016-12-29 NOTE — Discharge Instructions (Signed)
Colonoscopy Discharge Instructions  Read the instructions outlined below and refer to this sheet in the next few weeks. These discharge instructions provide you with general information on caring for yourself after you leave the hospital. Your doctor may also give you specific instructions. While your treatment has been planned according to the most current medical practices available, unavoidable complications occasionally occur. If you have any problems or questions after discharge, call Dr. Gala Romney at [phone]. ACTIVITY  You may resume your regular activity, but move at a slower pace for the next 24 hours.   Take frequent rest periods for the next 24 hours.   Walking will help get rid of the air and reduce the bloated feeling in your belly (abdomen).   No driving for 24 hours (because of the medicine (anesthesia) used during the test).    Do not sign any important legal documents or operate any machinery for 24 hours (because of the anesthesia used during the test).  NUTRITION  Drink plenty of fluids.   You may resume your normal diet as instructed by your doctor.   Begin with a light meal and progress to your normal diet. Heavy or fried foods are harder to digest and may make you feel sick to your stomach (nauseated).   Avoid alcoholic beverages for 24 hours or as instructed.  MEDICATIONS  You may resume your normal medications unless your doctor tells you otherwise.  WHAT YOU CAN EXPECT TODAY  Some feelings of bloating in the abdomen.   Passage of more gas than usual.   Spotting of blood in your stool or on the toilet paper.  IF YOU HAD POLYPS REMOVED DURING THE COLONOSCOPY:  No aspirin products for 7 days or as instructed.   No alcohol for 7 days or as instructed.   Eat a soft diet for the next 24 hours.  FINDING OUT THE RESULTS OF YOUR TEST Not all test results are available during your visit. If your test results are not back during the visit, make an appointment  with your caregiver to find out the results. Do not assume everything is normal if you have not heard from your caregiver or the medical facility. It is important for you to follow up on all of your test results.  SEEK IMMEDIATE MEDICAL ATTENTION IF:  You have more than a spotting of blood in your stool.   Your belly is swollen (abdominal distention).   You are nauseated or vomiting.   You have a temperature over 101.   You have abdominal pain or discomfort that is severe or gets worse throughout the day.    Colon polyp and diverticulosis information provided  Further recommendations to follow pending review of pathology report   Diverticulosis Diverticulosis is a condition that develops when small pouches (diverticula) form in the wall of the large intestine (colon). The colon is where water is absorbed and stool is formed. The pouches form when the inside layer of the colon pushes through weak spots in the outer layers of the colon. You may have a few pouches or many of them. What are the causes? The cause of this condition is not known. What increases the risk? The following factors may make you more likely to develop this condition:  Being older than age 49. Your risk for this condition increases with age. Diverticulosis is rare among people younger than age 38. By age 40, many people have it.  Eating a low-fiber diet.  Having frequent constipation.  Being overweight.  Not getting enough exercise.  Smoking.  Taking over-the-counter pain medicines, like aspirin and ibuprofen.  Having a family history of diverticulosis.  What are the signs or symptoms? In most people, there are no symptoms of this condition. If you do have symptoms, they may include:  Bloating.  Cramps in the abdomen.  Constipation or diarrhea.  Pain in the lower left side of the abdomen.  How is this diagnosed? This condition is most often diagnosed during an exam for other colon problems.  Because diverticulosis usually has no symptoms, it often cannot be diagnosed independently. This condition may be diagnosed by:  Using a flexible scope to examine the colon (colonoscopy).  Taking an X-ray of the colon after dye has been put into the colon (barium enema).  Doing a CT scan.  How is this treated? You may not need treatment for this condition if you have never developed an infection related to diverticulosis. If you have had an infection before, treatment may include:  Eating a high-fiber diet. This may include eating more fruits, vegetables, and grains.  Taking a fiber supplement.  Taking a live bacteria supplement (probiotic).  Taking medicine to relax your colon.  Taking antibiotic medicines.  Follow these instructions at home:  Drink 6-8 glasses of water or more each day to prevent constipation.  Try not to strain when you have a bowel movement.  If you have had an infection before: ? Eat more fiber as directed by your health care provider or your diet and nutrition specialist (dietitian). ? Take a fiber supplement or probiotic, if your health care provider approves.  Take over-the-counter and prescription medicines only as told by your health care provider.  If you were prescribed an antibiotic, take it as told by your health care provider. Do not stop taking the antibiotic even if you start to feel better.  Keep all follow-up visits as told by your health care provider. This is important. Contact a health care provider if:  You have pain in your abdomen.  You have bloating.  You have cramps.  You have not had a bowel movement in 3 days. Get help right away if:  Your pain gets worse.  Your bloating becomes very bad.  You have a fever or chills, and your symptoms suddenly get worse.  You vomit.  You have bowel movements that are bloody or black.  You have bleeding from your rectum. Summary  Diverticulosis is a condition that develops when  small pouches (diverticula) form in the wall of the large intestine (colon).  You may have a few pouches or many of them.  This condition is most often diagnosed during an exam for other colon problems.  If you have had an infection related to diverticulosis, treatment may include increasing the fiber in your diet, taking supplements, or taking medicines. This information is not intended to replace advice given to you by your health care provider. Make sure you discuss any questions you have with your health care provider. Document Released: 10/15/2003 Document Revised: 12/07/2015 Document Reviewed: 12/07/2015 Elsevier Interactive Patient Education  2017 [place].  Colon Polyps Polyps are tissue growths inside the body. Polyps can grow in many places, including the large intestine (colon). A polyp may be a round bump or a mushroom-shaped growth. You could have one polyp or several. Most colon polyps are noncancerous (benign). However, some colon polyps can become cancerous over time. What are the causes? The exact cause of colon polyps is not known. What  known. °What increases the risk? °This condition is more likely to develop in people who: °· Have a family history of colon cancer or colon polyps. °· Are older than 50 or older than 45 if they are African American. °· Have inflammatory bowel disease, such as ulcerative colitis or Crohn disease. °· Are overweight. °· Smoke cigarettes. °· Do not get enough exercise. °· Drink too much alcohol. °· Eat a diet that is: °? High in fat and red meat. °? Low in fiber. °· Had childhood cancer that was treated with abdominal radiation. ° °What are the signs or symptoms? °Most polyps do not cause symptoms. If you have symptoms, they may include: °· Blood coming from your rectum when having a bowel movement. °· Blood in your stool. The stool may look dark red or black. °· A change in bowel habits, such as constipation or diarrhea. ° °How is this diagnosed? °This  condition is diagnosed with a colonoscopy. This is a procedure that uses a lighted, flexible scope to look at the inside of your colon. °How is this treated? °Treatment for this condition involves removing any polyps that are found. Those polyps will then be tested for cancer. If cancer is found, your health care provider will talk to you about options for colon cancer treatment. °Follow these instructions at home: °Diet °· Eat plenty of fiber, such as fruits, vegetables, and whole grains. °· Eat foods that are high in calcium and vitamin D, such as milk, cheese, yogurt, eggs, liver, fish, and broccoli. °· Limit foods high in fat, red meats, and processed meats, such as hot dogs, sausage, bacon, and lunch meats. °· Maintain a healthy weight, or lose weight if recommended by your health care provider. °General instructions °· Do not smoke cigarettes. °· Do not drink alcohol excessively. °· Keep all follow-up visits as told by your health care provider. This is important. This includes keeping regularly scheduled colonoscopies. Talk to your health care provider about when you need a colonoscopy. °· Exercise every day or as told by your health care provider. °Contact a health care provider if: °· You have new or worsening bleeding during a bowel movement. °· You have new or increased blood in your stool. °· You have a change in bowel habits. °· You unexpectedly lose weight. °This information is not intended to replace advice given to you by your health care provider. Make sure you discuss any questions you have with your health care provider. °Document Released: 10/14/2003 Document Revised: 06/25/2015 Document Reviewed: 12/08/2014 °Elsevier Interactive Patient Education © 2018 Elsevier Inc. ° °

## 2017-01-01 ENCOUNTER — Encounter: Payer: Self-pay | Admitting: Internal Medicine

## 2017-01-02 ENCOUNTER — Encounter (HOSPITAL_COMMUNITY): Payer: Self-pay | Admitting: Internal Medicine

## 2017-01-19 ENCOUNTER — Encounter: Payer: Self-pay | Admitting: Family Medicine

## 2017-03-20 ENCOUNTER — Other Ambulatory Visit: Payer: Self-pay | Admitting: Family Medicine

## 2017-05-08 ENCOUNTER — Encounter: Payer: Self-pay | Admitting: Family Medicine

## 2017-05-08 ENCOUNTER — Ambulatory Visit: Payer: Commercial Managed Care - PPO | Admitting: Family Medicine

## 2017-05-08 VITALS — BP 128/82 | Ht 69.0 in | Wt 203.0 lb

## 2017-05-08 DIAGNOSIS — E7849 Other hyperlipidemia: Secondary | ICD-10-CM | POA: Diagnosis not present

## 2017-05-08 DIAGNOSIS — I10 Essential (primary) hypertension: Secondary | ICD-10-CM

## 2017-05-08 MED ORDER — PRAVASTATIN SODIUM 40 MG PO TABS: ORAL_TABLET | ORAL | 6 refills | Status: DC

## 2017-05-08 MED ORDER — VARENICLINE TARTRATE 1 MG PO TABS: 1.0000 mg | ORAL_TABLET | Freq: Two times a day (BID) | ORAL | 2 refills | Status: DC

## 2017-05-08 MED ORDER — VARENICLINE TARTRATE 0.5 MG X 11 & 1 MG X 42 PO MISC: ORAL | 0 refills | Status: DC

## 2017-05-08 MED ORDER — ALLOPURINOL 100 MG PO TABS: ORAL_TABLET | ORAL | 6 refills | Status: DC

## 2017-05-08 MED ORDER — ESOMEPRAZOLE MAGNESIUM 40 MG PO CPDR: DELAYED_RELEASE_CAPSULE | ORAL | 6 refills | Status: DC

## 2017-05-08 MED ORDER — LISINOPRIL-HYDROCHLOROTHIAZIDE 10-12.5 MG PO TABS: ORAL_TABLET | ORAL | 6 refills | Status: DC

## 2017-05-08 NOTE — Progress Notes (Signed)
   Subjective:    Patient ID: Brian Brewer, male    DOB: 08-27-1965, 52 y.o.   MRN: [id number]  Hypertension  This is a chronic problem. The current episode started more than 1 year ago. Pertinent negatives include no chest pain, headaches or shortness of breath. Risk factors for coronary artery disease include dyslipidemia and male gender. Treatments tried: lisinopril/hctz. There are no compliance problems.    Patient still smokes he knows he needs to quit Patient does use tobacco products.  Patient knows they should quit.  Patient is aware that smoking/in use of tobacco products increases their risk of heart disease, cancer, and COPD- lung issues.  Patient has been counseled to quit smoking/tobacco products.   Patient for blood pressure check up. Patient relates compliance with meds. Todays BP reviewed with the patient. Patient denies issues with medication. Patient relates reasonable diet. Patient tries to minimize salt. Patient aware of BP goals.    Review of Systems  Constitutional: Negative for activity change, fatigue and fever.  HENT: Negative for congestion and rhinorrhea.   Respiratory: Negative for cough and shortness of breath.   Cardiovascular: Negative for chest pain and leg swelling.  Gastrointestinal: Negative for abdominal pain, diarrhea and nausea.  Genitourinary: Negative for dysuria and hematuria.  Neurological: Negative for weakness and headaches.  Psychiatric/Behavioral: Negative for behavioral problems.       Objective:   Physical Exam  Constitutional: He appears well-nourished. No distress.  HENT:  Head: Normocephalic and atraumatic.  Eyes: Right eye exhibits no discharge. Left eye exhibits no discharge.  Neck: No tracheal deviation present.  Cardiovascular: Normal rate, regular rhythm and normal heart sounds.  No murmur heard. Pulmonary/Chest: Effort normal and breath sounds normal. No respiratory distress. He has no wheezes.  Musculoskeletal: He  exhibits no edema.  Lymphadenopathy:    He has no cervical adenopathy.  Neurological: He is alert.  Skin: Skin is warm. No rash noted.  Psychiatric: His behavior is normal.  Vitals reviewed.         Assessment & Plan:  HTN- Patient was seen today as part of a visit regarding hypertension. The importance of healthy diet and regular physical activity was discussed. The importance of compliance with medications discussed. Ideal goal is to keep blood pressure low elevated levels certainly below 409/81 when possible. The patient was counseled that keeping blood pressure under control lessen his risk of heart attack, stroke, kidney failure, and early death. The importance of regular follow-ups was discussed with the patient. Low-salt diet such as DASH recommended. Regular physical activity was recommended as well. Patient was advised to keep regular follow-ups.  Patient here for follow-up regarding cholesterol.  Patient does try to maintain a reasonable diet.  Patient does take the medication on a regular basis.  Denies missing a dose.  The patient denies any obvious side effects.  Prior blood work results reviewed with the patient.  The patient is aware of his cholesterol goals and the need to keep it under good control to lessen the risk of disease. Check lipid profile  Patient diagnosed with psoriatic arthritis under the care of specialist they will send [place] a copy of his labs  Patient encouraged to quit smoking given prescription Chantix if it causes depression or disturbing pains stop medication  Follow-up 6 months

## 2017-05-08 NOTE — Patient Instructions (Signed)
DASH Eating Plan DASH stands for "Dietary Approaches to Stop Hypertension." The DASH eating plan is a healthy eating plan that has been shown to reduce high blood pressure (hypertension). It may also reduce your risk for type 2 diabetes, heart disease, and stroke. The DASH eating plan may also help with weight loss. What are tips for following this plan? General guidelines  Avoid eating more than 2,300 mg (milligrams) of salt (sodium) a day. If you have hypertension, you may need to reduce your sodium intake to 1,500 mg a day.  Limit alcohol intake to no more than 1 drink a day for nonpregnant women and 2 drinks a day for men. One drink equals 12 oz of beer, 5 oz of wine, or 1 oz of hard liquor.  Work with your health care provider to maintain a healthy body weight or to lose weight. Ask what an ideal weight is for you.  Get at least 30 minutes of exercise that causes your heart to beat faster (aerobic exercise) most days of the week. Activities may include walking, swimming, or biking.  Work with your health care provider or diet and nutrition specialist (dietitian) to adjust your eating plan to your individual calorie needs. Reading food labels  Check food labels for the amount of sodium per serving. Choose foods with less than 5 percent of the Daily Value of sodium. Generally, foods with less than 300 mg of sodium per serving fit into this eating plan.  To find whole grains, look for the word "whole" as the first word in the ingredient list. Shopping  Buy products labeled as "low-sodium" or "no salt added."  Buy fresh foods. Avoid canned foods and premade or frozen meals. Cooking  Avoid adding salt when cooking. Use salt-free seasonings or herbs instead of table salt or sea salt. Check with your health care provider or pharmacist before using salt substitutes.  Do not fry foods. Cook foods using healthy methods such as baking, boiling, grilling, and broiling instead.  Cook with  heart-healthy oils, such as olive, canola, soybean, or sunflower oil. Meal planning   Eat a balanced diet that includes: ? 5 or more servings of fruits and vegetables each day. At each meal, try to fill half of your plate with fruits and vegetables. ? Up to 6-8 servings of whole grains each day. ? Less than 6 oz of lean meat, poultry, or fish each day. A 3-oz serving of meat is about the same size as a deck of cards. One egg equals 1 oz. ? 2 servings of low-fat dairy each day. ? A serving of nuts, seeds, or beans 5 times each week. ? Heart-healthy fats. Healthy fats called Omega-3 fatty acids are found in foods such as flaxseeds and coldwater fish, like sardines, salmon, and mackerel.  Limit how much you eat of the following: ? Canned or prepackaged foods. ? Food that is high in trans fat, such as fried foods. ? Food that is high in saturated fat, such as fatty meat. ? Sweets, desserts, sugary drinks, and other foods with added sugar. ? Full-fat dairy products.  Do not salt foods before eating.  Try to eat at least 2 vegetarian meals each week.  Eat more home-cooked food and less restaurant, buffet, and fast food.  When eating at a restaurant, ask that your food be prepared with less salt or no salt, if possible. What foods are recommended? The items listed may not be a complete list. Talk with your dietitian about what   dietary choices are best for you. Grains Whole-grain or whole-wheat bread. Whole-grain or whole-wheat pasta. Brown rice. Oatmeal. Quinoa. Bulgur. Whole-grain and low-sodium cereals. Pita bread. Low-fat, low-sodium crackers. Whole-wheat flour tortillas. Vegetables Fresh or frozen vegetables (raw, steamed, roasted, or grilled). Low-sodium or reduced-sodium tomato and vegetable juice. Low-sodium or reduced-sodium tomato sauce and tomato paste. Low-sodium or reduced-sodium canned vegetables. Fruits All fresh, dried, or frozen fruit. Canned fruit in natural juice (without  added sugar). Meat and other protein foods Skinless chicken or turkey. Ground chicken or turkey. Pork with fat trimmed off. Fish and seafood. Egg whites. Dried beans, peas, or lentils. Unsalted nuts, nut butters, and seeds. Unsalted canned beans. Lean cuts of beef with fat trimmed off. Low-sodium, lean deli meat. Dairy Low-fat (1%) or fat-free (skim) milk. Fat-free, low-fat, or reduced-fat cheeses. Nonfat, low-sodium ricotta or cottage cheese. Low-fat or nonfat yogurt. Low-fat, low-sodium cheese. Fats and oils Soft margarine without trans fats. Vegetable oil. Low-fat, reduced-fat, or light mayonnaise and salad dressings (reduced-sodium). Canola, safflower, olive, soybean, and sunflower oils. Avocado. Seasoning and other foods Herbs. Spices. Seasoning mixes without salt. Unsalted popcorn and pretzels. Fat-free sweets. What foods are not recommended? The items listed may not be a complete list. Talk with your dietitian about what dietary choices are best for you. Grains Baked goods made with fat, such as croissants, muffins, or some breads. Dry pasta or rice meal packs. Vegetables Creamed or fried vegetables. Vegetables in a cheese sauce. Regular canned vegetables (not low-sodium or reduced-sodium). Regular canned tomato sauce and paste (not low-sodium or reduced-sodium). Regular tomato and vegetable juice (not low-sodium or reduced-sodium). Pickles. Olives. Fruits Canned fruit in a light or heavy syrup. Fried fruit. Fruit in cream or butter sauce. Meat and other protein foods Fatty cuts of meat. Ribs. Fried meat. Bacon. Sausage. Bologna and other processed lunch meats. Salami. Fatback. Hotdogs. Bratwurst. Salted nuts and seeds. Canned beans with added salt. Canned or smoked fish. Whole eggs or egg yolks. Chicken or turkey with skin. Dairy Whole or 2% milk, cream, and half-and-half. Whole or full-fat cream cheese. Whole-fat or sweetened yogurt. Full-fat cheese. Nondairy creamers. Whipped toppings.  Processed cheese and cheese spreads. Fats and oils Butter. Stick margarine. Lard. Shortening. Ghee. Bacon fat. Tropical oils, such as coconut, palm kernel, or palm oil. Seasoning and other foods Salted popcorn and pretzels. Onion salt, garlic salt, seasoned salt, table salt, and sea salt. Worcestershire sauce. Tartar sauce. Barbecue sauce. Teriyaki sauce. Soy sauce, including reduced-sodium. Steak sauce. Canned and packaged gravies. Fish sauce. Oyster sauce. Cocktail sauce. Horseradish that you find on the shelf. Ketchup. Mustard. Meat flavorings and tenderizers. Bouillon cubes. Hot sauce and Tabasco sauce. Premade or packaged marinades. Premade or packaged taco seasonings. Relishes. Regular salad dressings. Where to find more information:  National Heart, Lung, and Blood Institute: www.nhlbi.nih.gov  American Heart Association: www.heart.org Summary  The DASH eating plan is a healthy eating plan that has been shown to reduce high blood pressure (hypertension). It may also reduce your risk for type 2 diabetes, heart disease, and stroke.  With the DASH eating plan, you should limit salt (sodium) intake to 2,300 mg a day. If you have hypertension, you may need to reduce your sodium intake to 1,500 mg a day.  When on the DASH eating plan, aim to eat more fresh fruits and vegetables, whole grains, lean proteins, low-fat dairy, and heart-healthy fats.  Work with your health care provider or diet and nutrition specialist (dietitian) to adjust your eating plan to your individual   calorie needs. This information is not intended to replace advice given to you by your health care provider. Make sure you discuss any questions you have with your health care provider. Document Released: 01/06/2011 Document Revised: 01/11/2016 Document Reviewed: 01/11/2016 Elsevier Interactive Patient Education  2018 Elsevier Inc.  

## 2017-05-10 ENCOUNTER — Telehealth: Payer: Self-pay | Admitting: Family Medicine

## 2017-05-10 NOTE — Telephone Encounter (Signed)
Please let the patient know that I reviewed over his lab work from his rheumatologist.  This patient does need to do a lipid profile, and glucose-diagnosis diabetes screening, hyperlipidemia

## 2017-05-11 ENCOUNTER — Other Ambulatory Visit: Payer: Self-pay | Admitting: Family Medicine

## 2017-05-11 DIAGNOSIS — E785 Hyperlipidemia, unspecified: Secondary | ICD-10-CM

## 2017-05-11 DIAGNOSIS — Z131 Encounter for screening for diabetes mellitus: Secondary | ICD-10-CM

## 2017-05-11 NOTE — Telephone Encounter (Signed)
Spoke with patient; informed him of blood work that needed to be done. Verbalized understanding

## 2017-05-13 ENCOUNTER — Encounter: Payer: Self-pay | Admitting: Family Medicine

## 2017-05-13 LAB — LIPID PANEL
CHOL/HDL RATIO: 4.6 ratio (ref 0.0–5.0)
Cholesterol, Total: 137 mg/dL (ref 100–199)
HDL: 30 mg/dL — ABNORMAL LOW (ref >39)
LDL Calculated: 77 mg/dL (ref 0–99)
Triglycerides: 150 mg/dL — ABNORMAL HIGH (ref 0–149)
VLDL CHOLESTEROL CAL: 30 mg/dL (ref 5–40)

## 2017-05-13 LAB — GLUCOSE, RANDOM: GLUCOSE: 105 mg/dL — AB (ref 65–99)

## 2017-08-17 ENCOUNTER — Other Ambulatory Visit: Payer: Self-pay | Admitting: Family Medicine

## 2017-10-13 ENCOUNTER — Ambulatory Visit: Payer: Commercial Managed Care - PPO | Admitting: Family Medicine

## 2017-10-13 ENCOUNTER — Encounter: Payer: Self-pay | Admitting: Family Medicine

## 2017-10-13 VITALS — BP 130/80 | Temp 98.7°F | Ht 69.0 in | Wt 201.6 lb

## 2017-10-13 DIAGNOSIS — J329 Chronic sinusitis, unspecified: Secondary | ICD-10-CM

## 2017-10-13 DIAGNOSIS — J4521 Mild intermittent asthma with (acute) exacerbation: Secondary | ICD-10-CM

## 2017-10-13 DIAGNOSIS — J310 Chronic rhinitis: Secondary | ICD-10-CM

## 2017-10-13 MED ORDER — PREDNISONE 20 MG PO TABS: ORAL_TABLET | ORAL | 0 refills | Status: DC

## 2017-10-13 MED ORDER — ALBUTEROL SULFATE HFA 108 (90 BASE) MCG/ACT IN AERS: 2.0000 | INHALATION_SPRAY | Freq: Four times a day (QID) | RESPIRATORY_TRACT | 2 refills | Status: DC | PRN

## 2017-10-13 MED ORDER — CLARITHROMYCIN 500 MG PO TABS: ORAL_TABLET | ORAL | 0 refills | Status: DC

## 2017-10-13 NOTE — Progress Notes (Signed)
   Subjective:    Patient ID: Brian Brewer, male    DOB: [birth year], 52 y.o.   MRN: [id number]  Cough  This is a new problem. The current episode started in the past 7 days. Associated symptoms include a fever, headaches, nasal congestion, a sore throat and wheezing. He has tried OTC cough suppressant for the symptoms.    Hit fairly hard this wek with cough and cong  Sinuses draing   Bad headache, and fullness     Wheezy and tifht, dos smoke  Pos use of inhaler +                                                                     +++++    Review of Systems  Constitutional: Positive for fever.  HENT: Positive for sore throat.   Respiratory: Positive for cough and wheezing.   Neurological: Positive for headaches.       Objective:   Physical Exam  Alert, mild malaise. Hydration good Vitals stable. frontal/ maxillary tenderness evident positive nasal congestion. pharynx normal neck supple  lungs clear/no crackles however positive substantial wheezes. heart regular in rhythm      Assessment & Plan:  Impression rhinosinusitis/bronchitis with impressive exacerbation reactive airways.  Likely post viral, discussed with patient. plan antibiotics prescribed.  Albuterol proper use discussed.  Prednisone taper.  Questions answered. Symptomatic care discussed. warning signs discussed. WSL

## 2017-10-17 ENCOUNTER — Other Ambulatory Visit: Payer: Self-pay | Admitting: *Deleted

## 2017-10-17 ENCOUNTER — Telehealth: Payer: Self-pay | Admitting: Family Medicine

## 2017-10-17 MED ORDER — PREDNISONE 20 MG PO TABS: ORAL_TABLET | ORAL | 0 refills | Status: DC

## 2017-10-17 MED ORDER — DOXYCYCLINE HYCLATE 100 MG PO TABS: 100.0000 mg | ORAL_TABLET | Freq: Two times a day (BID) | ORAL | 0 refills | Status: DC

## 2017-10-17 NOTE — Telephone Encounter (Signed)
Taking biaxin and prednisone and using inhaler. Started on Friday. Still having wheezing, coughing up mucus - clear when laying down. No fever, not feeling any better.

## 2017-10-17 NOTE — Telephone Encounter (Signed)
Discussed with pt. Pt verbalized understanding. meds sent to pharm.  

## 2017-10-17 NOTE — Telephone Encounter (Signed)
Pt was seen on 10/13/17. His symptoms have not improved at all and his cough has gotten worse especially at night time when laying down. He is hoping something else could be called in to [facility], [place]

## 2017-10-17 NOTE — Telephone Encounter (Signed)
Certainly this illness is affecting his lungs.  Often this can take a good 10 to 14 days to see significant improvement.  If getting progressively worse or increased issues I would like to recheck him We will make some changes in the medication I recommend stopping Biaxin, start doxycycline 1 twice daily 100 mg take with a snack in a tall glass of water for the next 10 days I would recommend a longer taper on prednisone to go as follows-he should restart it based on the following Prednisone 20 mg 3 daily for the next 3 days, then 2 daily for 4 days, then 1 daily for 4 days If he starts getting worse as the week goes on ought to be seen If not doing significantly better within the next 7 days he needs to be rechecked

## 2017-10-27 ENCOUNTER — Observation Stay (HOSPITAL_COMMUNITY)
Admission: EM | Admit: 2017-10-27 | Discharge: 2017-10-28 | Disposition: A | Discharge status: Home / Self Care | Payer: Commercial Managed Care - PPO | Attending: Internal Medicine | Admitting: Internal Medicine

## 2017-10-27 ENCOUNTER — Encounter: Payer: Self-pay | Admitting: Family Medicine

## 2017-10-27 ENCOUNTER — Ambulatory Visit: Payer: Commercial Managed Care - PPO | Admitting: Family Medicine

## 2017-10-27 ENCOUNTER — Emergency Department (HOSPITAL_COMMUNITY): Payer: Commercial Managed Care - PPO

## 2017-10-27 ENCOUNTER — Other Ambulatory Visit: Payer: Self-pay

## 2017-10-27 ENCOUNTER — Encounter (HOSPITAL_COMMUNITY): Payer: Self-pay | Admitting: Emergency Medicine

## 2017-10-27 VITALS — BP 118/80 | Temp 98.4°F | Ht 69.0 in | Wt 209.0 lb

## 2017-10-27 DIAGNOSIS — K219 Gastro-esophageal reflux disease without esophagitis: Secondary | ICD-10-CM | POA: Diagnosis present

## 2017-10-27 DIAGNOSIS — Z7982 Long term (current) use of aspirin: Secondary | ICD-10-CM | POA: Insufficient documentation

## 2017-10-27 DIAGNOSIS — R079 Chest pain, unspecified: Principal | ICD-10-CM | POA: Diagnosis present

## 2017-10-27 DIAGNOSIS — I200 Unstable angina: Secondary | ICD-10-CM | POA: Diagnosis not present

## 2017-10-27 DIAGNOSIS — Z79899 Other long term (current) drug therapy: Secondary | ICD-10-CM | POA: Insufficient documentation

## 2017-10-27 DIAGNOSIS — L4050 Arthropathic psoriasis, unspecified: Secondary | ICD-10-CM | POA: Diagnosis present

## 2017-10-27 DIAGNOSIS — E785 Hyperlipidemia, unspecified: Secondary | ICD-10-CM | POA: Diagnosis present

## 2017-10-27 DIAGNOSIS — E790 Hyperuricemia without signs of inflammatory arthritis and tophaceous disease: Secondary | ICD-10-CM | POA: Diagnosis present

## 2017-10-27 DIAGNOSIS — J40 Bronchitis, not specified as acute or chronic: Secondary | ICD-10-CM

## 2017-10-27 DIAGNOSIS — I10 Essential (primary) hypertension: Secondary | ICD-10-CM | POA: Diagnosis not present

## 2017-10-27 DIAGNOSIS — Z720 Tobacco use: Secondary | ICD-10-CM | POA: Diagnosis present

## 2017-10-27 DIAGNOSIS — F17210 Nicotine dependence, cigarettes, uncomplicated: Secondary | ICD-10-CM | POA: Insufficient documentation

## 2017-10-27 HISTORY — DX: Arthropathic psoriasis, unspecified: L40.50

## 2017-10-27 LAB — MAGNESIUM: MAGNESIUM: 2.2 mg/dL (ref 1.7–2.4)

## 2017-10-27 LAB — BASIC METABOLIC PANEL
ANION GAP: 7 (ref 5–15)
BUN: 14 mg/dL (ref 6–20)
CALCIUM: 9.1 mg/dL (ref 8.9–10.3)
CHLORIDE: 102 mmol/L (ref 98–111)
CO2: 26 mmol/L (ref 22–32)
Creatinine, Ser: 0.99 mg/dL (ref 0.61–1.24)
GFR calc non Af Amer: >60 mL/min (ref >60)
Glucose, Bld: 80 mg/dL (ref 70–99)
Potassium: 3.6 mmol/L (ref 3.5–5.1)
SODIUM: 135 mmol/L (ref 135–145)

## 2017-10-27 LAB — CBC
HCT: 45.9 % (ref 39.0–52.0)
HEMOGLOBIN: 16 g/dL (ref 13.0–17.0)
MCH: 31.4 pg (ref 26.0–34.0)
MCHC: 34.9 g/dL (ref 30.0–36.0)
MCV: 90 fL (ref 78.0–100.0)
PLATELETS: 210 10*3/uL (ref 150–400)
RBC: 5.1 MIL/uL (ref 4.22–5.81)
RDW: 13.3 % (ref 11.5–15.5)
WBC: 11.3 10*3/uL — AB (ref 4.0–10.5)

## 2017-10-27 LAB — HEPATIC FUNCTION PANEL
ALT: 26 U/L (ref 0–44)
AST: 22 U/L (ref 15–41)
Albumin: 4.1 g/dL (ref 3.5–5.0)
Alkaline Phosphatase: 58 U/L (ref 38–126)
BILIRUBIN TOTAL: 0.5 mg/dL (ref 0.3–1.2)
Total Protein: 7.1 g/dL (ref 6.5–8.1)

## 2017-10-27 LAB — TROPONIN I: Troponin I: <0.03 ng/mL (ref <0.03)

## 2017-10-27 LAB — I-STAT TROPONIN, ED: TROPONIN I, POC: 0.01 ng/mL (ref 0.00–0.08)

## 2017-10-27 MED ORDER — LISINOPRIL 10 MG PO TABS
10.0000 mg | ORAL_TABLET | Freq: Every day | ORAL | Status: DC
  Administered 2017-10-28: 10 mg via ORAL
  Filled 2017-10-27: qty 1

## 2017-10-27 MED ORDER — ACETAMINOPHEN 325 MG PO TABS
650.0000 mg | ORAL_TABLET | Freq: Four times a day (QID) | ORAL | Status: DC | PRN
  Administered 2017-10-27: 650 mg via ORAL
  Filled 2017-10-27: qty 2

## 2017-10-27 MED ORDER — LISINOPRIL-HYDROCHLOROTHIAZIDE 10-12.5 MG PO TABS: 1.0000 | ORAL_TABLET | Freq: Every day | ORAL | Status: DC

## 2017-10-27 MED ORDER — ALLOPURINOL 100 MG PO TABS
100.0000 mg | ORAL_TABLET | Freq: Every day | ORAL | Status: DC
  Administered 2017-10-28: 100 mg via ORAL
  Filled 2017-10-27: qty 1

## 2017-10-27 MED ORDER — HYDROCHLOROTHIAZIDE 12.5 MG PO CAPS
12.5000 mg | ORAL_CAPSULE | Freq: Every day | ORAL | Status: DC
  Administered 2017-10-28: 12.5 mg via ORAL
  Filled 2017-10-27: qty 1

## 2017-10-27 MED ORDER — ASPIRIN 81 MG PO CHEW
324.0000 mg | CHEWABLE_TABLET | Freq: Once | ORAL | Status: AC
  Administered 2017-10-27: 324 mg via ORAL
  Filled 2017-10-27: qty 4

## 2017-10-27 MED ORDER — VARENICLINE TARTRATE 1 MG PO TABS
1.0000 mg | ORAL_TABLET | Freq: Two times a day (BID) | ORAL | Status: DC
  Filled 2017-10-27 (×6): qty 1

## 2017-10-27 MED ORDER — ENOXAPARIN SODIUM 40 MG/0.4ML ~~LOC~~ SOLN
40.0000 mg | SUBCUTANEOUS | Status: DC
  Administered 2017-10-27: 40 mg via SUBCUTANEOUS
  Filled 2017-10-27: qty 0.4

## 2017-10-27 MED ORDER — PRAVASTATIN SODIUM 40 MG PO TABS
40.0000 mg | ORAL_TABLET | Freq: Every evening | ORAL | Status: DC
  Administered 2017-10-27: 40 mg via ORAL
  Filled 2017-10-27: qty 1

## 2017-10-27 MED ORDER — ACETAMINOPHEN 650 MG RE SUPP: 650.0000 mg | Freq: Four times a day (QID) | RECTAL | Status: DC | PRN

## 2017-10-27 MED ORDER — PANTOPRAZOLE SODIUM 40 MG PO TBEC
40.0000 mg | DELAYED_RELEASE_TABLET | Freq: Every day | ORAL | Status: DC
  Administered 2017-10-28: 40 mg via ORAL
  Filled 2017-10-27: qty 1

## 2017-10-27 MED ORDER — ONDANSETRON HCL 4 MG PO TABS: 4.0000 mg | ORAL_TABLET | Freq: Four times a day (QID) | ORAL | Status: DC | PRN

## 2017-10-27 MED ORDER — NITROGLYCERIN 0.4 MG SL SUBL
0.4000 mg | SUBLINGUAL_TABLET | SUBLINGUAL | Status: DC | PRN
  Administered 2017-10-27: 0.4 mg via SUBLINGUAL
  Filled 2017-10-27: qty 1

## 2017-10-27 MED ORDER — ALBUTEROL SULFATE (2.5 MG/3ML) 0.083% IN NEBU: 3.0000 mL | INHALATION_SOLUTION | Freq: Four times a day (QID) | RESPIRATORY_TRACT | Status: DC | PRN

## 2017-10-27 MED ORDER — ONDANSETRON HCL 4 MG/2ML IJ SOLN: 4.0000 mg | Freq: Four times a day (QID) | INTRAMUSCULAR | Status: DC | PRN

## 2017-10-27 MED ORDER — POTASSIUM CHLORIDE CRYS ER 20 MEQ PO TBCR
20.0000 meq | EXTENDED_RELEASE_TABLET | Freq: Once | ORAL | Status: AC
  Administered 2017-10-27: 20 meq via ORAL
  Filled 2017-10-27: qty 1

## 2017-10-27 MED ORDER — ASPIRIN EC 81 MG PO TBEC
81.0000 mg | DELAYED_RELEASE_TABLET | Freq: Every day | ORAL | Status: DC
  Administered 2017-10-28: 81 mg via ORAL
  Filled 2017-10-27: qty 1

## 2017-10-27 NOTE — Progress Notes (Signed)
   Subjective:    Patient ID: Brian Brewer, male    DOB: 1965-02-18, 52 y.o.   MRN: [id number]  Hypertension  This is a chronic problem. The current episode started 1 to 4 weeks ago. Associated symptoms include chest pain, headaches and palpitations. Pertinent negatives include no shortness of breath. (Chest tightness) There are no compliance problems.    Pt states he was here about 2 weeks ago for URI and was put on ABX and a steroid. Stopped taking the prednisone on Tuesday this week because he was feeling his heart fluttering, increased appetite. Pt states that he is fine in the morning but by the end of the day he has a splitting headache and feels exhausted and his heart flutters are worse.   Chest pressure to left side of chest intermittently since Tuesday of this week, denies radiation. Feels weak. States he gets hot for about 20 minutes intermittently, denies sweating. Reports feels his heart fluttering.  Feeling chest pressure and heart fluttering currently will get EKG now.  This patient does have cardiac risk factors Patient does smoke although we have discussed quitting multiple times  Review of Systems  Constitutional: Positive for fatigue. Negative for activity change and fever.  HENT: Negative for congestion and rhinorrhea.   Respiratory: Negative for cough and shortness of breath.   Cardiovascular: Positive for chest pain and palpitations. Negative for leg swelling.  Gastrointestinal: Negative for abdominal pain, diarrhea and nausea.  Genitourinary: Negative for dysuria and hematuria.  Neurological: Positive for headaches. Negative for weakness.  Psychiatric/Behavioral: Negative for agitation and behavioral problems.       Objective:   Physical Exam  Constitutional: He appears well-nourished. No distress.  HENT:  Head: Normocephalic and atraumatic.  Eyes: Right eye exhibits no discharge. Left eye exhibits no discharge.  Neck: No tracheal deviation present.    Cardiovascular: Normal rate, regular rhythm and normal heart sounds.  No murmur heard. Pulmonary/Chest: Effort normal and breath sounds normal. No respiratory distress.  Musculoskeletal: He exhibits no edema.  Lymphadenopathy:    He has no cervical adenopathy.  Neurological: He is alert. Coordination normal.  Skin: Skin is warm and dry.  Psychiatric: He has a normal mood and affect. His behavior is normal.  Vitals reviewed.  Heart rate is regular      Assessment & Plan:  EKG compared to previous from 2012; abnormal, showed ST elevation in AVF and ST depression in avl This patient has multiple cardiac risk factors His symptomatology goes along with the possibility of unstable angina/acute coronary syndrome He has an abnormal EKG along with having current chest pressure ER physician was spoken with Triage spoke with The patient was escorted by personal vehicle into the ER for expedient care

## 2017-10-27 NOTE — H&P (Signed)
History and Physical    Brian Brewer GMW:[id number] DOB: 30-Jul-1965 DOA: 10/27/2017  PCP: Brian Drown, MD  Patient coming from: Home.  I have personally briefly reviewed patient's old medical records in [place]  Chief Complaint: Chest pain.  HPI: Brian Brewer is a 52 y.o. male with medical history significant of GERD, gout, hyperlipidemia, hypertension, psoriatic arthritis who is coming to the emergency department with complaints of intermittent chest pain since Tuesday.  Per patient, about 2 weeks ago he had an upper respiratory infection followed by cough, wheezing, fatigue and malaise.  He saw his physician who prescribed albuterol, clarithromycin and prednisone.  He was also started on Chantix pack.  He has ceased smoking since then.  He mentions having pleuritic chest pain at times, but particularly since Tuesday he has been having pressure-like chest pain on his left precordial area, non-radiated, pleuritic, reproducible and associated with dyspnea.  He denies dizziness, diaphoresis, nausea or emesis.  He has had occasional palpitations not related to his chest pain, but upon further review may be due to the use of albuterol inhaler.  He denies PND, orthopnea or lower extremity edema.  He had a normal echo stress test without contrast in 2014.  He denies abdominal pain, diarrhea, constipation, melena or hematochezia.  Denies dysuria, frequency or hematuria.  No polyuria, polydipsia, polyphagia or blurred vision (the patient has had documented hyperglycemia in the past).  No heat or cold intolerance.  Denies any skin rashes or any significant joint pain.  He did not have his Humira recently due to acute respiratory illness.  ED Course: Initial vital signs temperature 98.2 F, pulse 68, respirations 16, blood pressure 151/83 mmHg and O2 sat 98% on room air.  The patient was given 324 mg of chewable aspirin and sublingual nitroglycerin in the emergency department reporting relief  of pain.  EKG shows T wave inversion aVL which is unchanged from previous.  There is some artifact.  An i-STAT troponin was normal.His white count was 11.3, hemoglobin 16.0 g/dL and platelets 210.  BMP was normal.  His chest radiograph did not show any acute cardiopulmonary pathology.  Review of Systems: As per HPI otherwise 10 point review of systems negative.   Past Medical History:  Diagnosis Date  . GERD (gastroesophageal reflux disease)   . Gout   . Hyperlipemia   . Hypertension   . Psoriatic arthritis ([place]) 12/11/2013   Dr Brian Brewer     Past Surgical History:  Procedure Laterality Date  . BACK SURGERY  2003   L4,5,S1 Fusion  . CHOLECYSTECTOMY    . COLONOSCOPY N/A 12/29/2016   Procedure: COLONOSCOPY;  Surgeon: Brian Dolin, MD;  Location[facility];  Service: Endoscopy;  Laterality: N/A;  9:15 AM  . LUMBAR FUSION     [place]rs [place]     reports that he has been smoking cigarettes. He has a 10.00 pack-year smoking history. He has never used smokeless tobacco. He reports that he does not drink alcohol or use drugs.  Allergies  Allergen Reactions  . Amoxicillin-Pot Clavulanate Other (See Comments)    Cramps and diarrhea Has patient had a PCN reaction causing immediate rash, facial/tongue/throat swelling, SOB or lightheadedness with hypotension: No Has patient had a PCN reaction causing severe rash involving mucus membranes or skin necrosis: No Has patient had a PCN reaction that required hospitalization: No Has patient had a PCN reaction occurring within the last 10 years: No  If all of the  above answers are "NO", then may proceed with Cephalosporin use.  . Levofloxacin Other (See Comments)    Passed out (possibly due to medication)  . Zithromax [Azithromycin] Diarrhea    Family History  Problem Relation Age of Onset  . Heart disease Father   . Hypertension Father   . Leukemia Father   . Anesthesia problems Neg Hx   . Hypotension Neg Hx   . Malignant  hyperthermia Neg Hx   . Pseudochol deficiency Neg Hx     Prior to Admission medications   Medication Sig Start Date End Date Taking? Authorizing Provider  allopurinol (ZYLOPRIM) 100 MG tablet TAKE (1) TABLET BY MOUTH ONCE DAILY. Patient taking differently: Take 100 mg by mouth daily. TAKE (1) TABLET BY MOUTH ONCE DAILY. 08/17/17  Yes Brian Drown, MD  esomeprazole (NEXIUM) 40 MG capsule 1 qd Patient taking differently: Take 40 mg by mouth daily. 1 qd 05/08/17  Yes Brewer, Brian Snare, MD  lisinopril-hydrochlorothiazide (PRINZIDE,ZESTORETIC) 10-12.5 MG tablet TAKE (1) TABLET BY MOUTH ONCE DAILY. Patient taking differently: Take 1 tablet by mouth daily. TAKE (1) TABLET BY MOUTH ONCE DAILY. 08/17/17  Yes Brian Drown, MD  pravastatin (PRAVACHOL) 40 MG tablet 1 q evening Patient taking differently: Take 40 mg by mouth every evening. 1 q evening 05/08/17  Yes Brewer, Brian Snare, MD  albuterol (PROVENTIL HFA;VENTOLIN HFA) 108 (90 Base) MCG/ACT inhaler Inhale 2 puffs into the lungs every 6 (six) hours as needed for wheezing or shortness of breath. 10/13/17   Brian Kirschner, MD  aspirin EC 81 MG tablet Take 81 mg by mouth daily.      [provider]  clarithromycin (BIAXIN) 500 MG tablet One p o bid tn d Patient taking differently: Take 500 mg by mouth 2 (two) times daily. One p o bid tn d 10/13/17   Brian Kirschner, MD  doxycycline (VIBRA-TABS) 100 MG tablet Take 1 tablet (100 mg total) by mouth 2 (two) times daily. 10/17/17   Brewer, Brian Snare, MD  HUMIRA PEN 40 MG/0.8ML PNKT Inject 40 mg into the skin every 14 (fourteen) days. 11/05/16   [provider]  predniSONE (DELTASONE) 20 MG tablet Three qd for three d, two qd for three d, one qd for two d Patient taking differently: Take 20 mg by mouth daily with breakfast. Three qd for three d, two qd for three d, one qd for two d 10/13/17   Brian Kirschner, MD  predniSONE (DELTASONE) 20 MG tablet Take 3 tabs for 3 days, then 2 tabs for 4 days,  then one tab for 4 days Patient taking differently: Take 20 mg by mouth daily with breakfast. Take 3 tabs for 3 days, then 2 tabs for 3 days, then one tab for 2 days 10/17/17   Brian Drown, MD  varenicline (CHANTIX CONTINUING MONTH PAK) 1 MG tablet Take 1 tablet (1 mg total) by mouth 2 (two) times daily. 05/08/17   Brian Drown, MD  varenicline (CHANTIX STARTING MONTH PAK) 0.5 MG X 11 & 1 MG X 42 tablet Take one 0.5 mg tablet by mouth once daily for 3 days, then increase to one 0.5 mg tablet twice daily for 4 days, then increase to one 1 mg tablet twice daily. Patient taking differently: Take 1 mg by mouth 2 (two) times daily. Take one 0.5 mg tablet by mouth once daily for 3 days, then increase to one 0.5 mg tablet twice daily for 4 days, then increase to  one 1 mg tablet twice daily. 05/08/17   Brian Drown, MD    Physical Exam: Vitals:   10/27/17 1700 10/27/17 1703 10/27/17 1730 10/27/17 1800  BP: (!) 151/83 (!) 152/91 (!) 146/79 120/83  Pulse: 68 72 64 66  Resp: 16 18 19 19   Temp:  98.2 F (36.8 C)    TempSrc:  Oral    SpO2: 98% 100% 98% 97%  Weight:      Height:        Constitutional: NAD, calm, comfortable Eyes: PERRL, lids and conjunctivae normal ENMT: Mucous membranes are moist. Posterior pharynx clear of any exudate or lesions. Neck: normal, supple, no masses, no thyromegaly Respiratory: Mild wheezing bilaterally,  no crackles. Normal respiratory effort. No accessory muscle use.  Cardiovascular: Regular rate and rhythm, no murmurs / rubs / gallops. No extremity edema. 2+ pedal pulses. No carotid bruits.  Abdomen: no tenderness, no masses palpated. No hepatosplenomegaly. Bowel sounds positive.  Musculoskeletal: no clubbing / cyanosis. Good ROM, no contractures. Normal muscle tone.  Skin: no rashes, lesions, ulcers on limited dermatological examination. Neurologic: CN 2-12 grossly intact. Sensation intact, DTR normal. Strength 5/5 in all 4.  Psychiatric: Normal judgment and  insight. Alert and oriented x 4.. Normal mood.   Labs on Admission: I have personally reviewed following labs and imaging studies  CBC: Recent Labs  Lab 10/27/17 1657  WBC 11.3*  HGB 16.0  HCT 45.9  MCV 90.0  PLT 546   Basic Metabolic Panel: Recent Labs  Lab 10/27/17 1657  NA 135  K 3.6  CL 102  CO2 26  GLUCOSE 80  BUN 14  CREATININE 0.99  CALCIUM 9.1   GFR: Estimated Creatinine Clearance: 101.8 mL/min (by C-G formula based on SCr of 0.99 mg/dL). Liver Function Tests: No results for input(s): AST, ALT, ALKPHOS, BILITOT, PROT, ALBUMIN in the last 168 hours. No results for input(s): LIPASE, AMYLASE in the last 168 hours. No results for input(s): AMMONIA in the last 168 hours. Coagulation Profile: No results for input(s): INR, PROTIME in the last 168 hours. Cardiac Enzymes: No results for input(s): CKTOTAL, CKMB, CKMBINDEX, TROPONINI in the last 168 hours. BNP (last 3 results) No results for input(s): PROBNP in the last 8760 hours. HbA1C: No results for input(s): HGBA1C in the last 72 hours. CBG: No results for input(s): GLUCAP in the last 168 hours. Lipid Profile: No results for input(s): CHOL, HDL, LDLCALC, TRIG, CHOLHDL, LDLDIRECT in the last 72 hours. Thyroid Function Tests: No results for input(s): TSH, T4TOTAL, FREET4, T3FREE, THYROIDAB in the last 72 hours. Anemia Panel: No results for input(s): VITAMINB12, FOLATE, FERRITIN, TIBC, IRON, RETICCTPCT in the last 72 hours. Urine analysis: No results found for: COLORURINE, APPEARANCEUR, LABSPEC, PHURINE, GLUCOSEU, HGBUR, BILIRUBINUR, KETONESUR, PROTEINUR, UROBILINOGEN, NITRITE, LEUKOCYTESUR  Radiological Exams on Admission: Dg Chest 2 View  Result Date: 10/27/2017 CLINICAL DATA:  Chest pain.  ST-elevation. EXAM: CHEST - 2 VIEW COMPARISON:  March 12, 2012 FINDINGS: The heart size and mediastinal contours are within normal limits. Both lungs are clear. The visualized skeletal structures are unremarkable.  IMPRESSION: No active cardiopulmonary disease. Electronically Signed   By: Dorise Bullion III M.D   On: 10/27/2017 18:30   March 30, 2012 echo stress test without contrast ------------------------------------------------------------ Indications:   Chest pain 786.51.  ------------------------------------------------------------ History:  PMH: No prior cardiac history. Risk factors: Current tobacco use.  ------------------------------------------------------------ Study Conclusions  - Stress ECG conclusions: There were no stress arrhythmias or conduction abnormalities. The stress ECG was negative  for ischemia with only insignificant upsloping ST segment depression noted. - Staged echo: There was no echocardiographic evidence for stress-induced ischemia. Bruce protocol. Stress echocardiography. Height: Height: 177.8cm. Height: 70in. Weight: Weight: 92.7kg. Weight: 204lb. Body mass index: BMI: 29.3kg/m^2. Body surface area:  BSA: 2.71m^2. Blood pressure:   132/85. Patient status: Outpatient.  EKG: Independently reviewed. Vent. rate 70 BPM PR interval * ms QRS duration 102 ms QT/QTc 389/420 ms P-R-T axes 62 57 72 Sinus rhythm  Assessment/Plan Principal Problem:   Chest pain Multiple risk factors for CAD. Observation/telemetry. Supplemental oxygen as needed. Continue nitroglycerin as needed. Continue daily aspirin. Trend troponin levels. Check echocardiogram in a.m. She will follow-up with cardiology as an outpatient for further work-up given risk factors.  Active Problems:   Bronchitis due to tobacco use Supplemental oxygen as needed. Continue albuterol MDI as needed. Continue Chantix for smoking cessation.    GERD Continue PPI.    HTN (hypertension), benign Continue lisinopril 10 mg p.o. daily. Continue hydrochlorothiazide 12.5 mg p.o. daily. Monitor blood pressure, renal function and electrolytes.    Hyperlipemia Continue  pravastatin 40 mg p.o. Daily. Check hepatic function panel. Fasting lipid profile follow-up per primary.    Psoriatic arthritis ([place]) On Humira injections every 14 days. Continue follow-ups with rheumatology as scheduled.    Hyperuricemia Continue allopurinol 100 mg p.o. daily.    DVT prophylaxis: Lovenox SQ. Code Status: Full code. Family Communication:  Disposition Plan: Observation for troponin level trending and repeat EKG.. Consults called:  Admission status: Observation/telemetry.   Reubin Milan MD [facility] Pager [phone].  If 7PM-7AM, please contact night-coverage [email or web address] Password [facility]  10/27/2017, 8:04 PM

## 2017-10-27 NOTE — ED Triage Notes (Signed)
Patient brought from Dr. Yehuda Mao office for possible ST elevation.

## 2017-10-27 NOTE — ED Provider Notes (Signed)
[facility] EMERGENCY DEPARTMENT Provider Note   CSN: [id number] Arrival date & time: 10/27/17  1654     History   Chief Complaint Chief Complaint  Patient presents with  . Chest Pain    HPI Brian Brewer is a 52 y.o. male.  Pt presents to the ED today with CP.  Pt has been having CP intermittently since Tuesday, September 23rd.  He had cp today starting around noon which did not go away, so he went to his PCP office.  The PCP did an EKG which was abnormal and sent him here.  The pt did take a baby asa this morning.  No nitro.     Past Medical History:  Diagnosis Date  . GERD (gastroesophageal reflux disease)   . Gout   . Hyperlipemia   . Hypertension     Patient Active Problem List   Diagnosis Date Noted  . Psoriatic arthritis ([place]) 12/11/2013  . Hyperuricemia 12/11/2013  . HTN (hypertension), benign 10/22/2012  . Hyperglycemia 10/22/2012  . Hyperlipemia 10/22/2012  . Allergic rhinitis 05/21/2012  . GERD 05/27/2009  . ABDOMINAL PAIN, RIGHT UPPER QUADRANT 05/27/2009    Past Surgical History:  Procedure Laterality Date  . BACK SURGERY  2003   L4,5,S1 Fusion  . CHOLECYSTECTOMY    . COLONOSCOPY N/A 12/29/2016   Procedure: COLONOSCOPY;  Surgeon: Daneil Dolin, MD;  Location: [facility];  Service: Endoscopy;  Laterality: N/A;  9:15 AM  . LUMBAR FUSION     3 yrs ago-[place]        Home Medications    Prior to Admission medications   Medication Sig Start Date End Date Taking? Authorizing Provider  allopurinol (ZYLOPRIM) 100 MG tablet TAKE (1) TABLET BY MOUTH ONCE DAILY. 08/17/17  Yes Kathyrn Drown, MD  esomeprazole (NEXIUM) 40 MG capsule 1 qd Patient taking differently: Take 40 mg by mouth daily. 1 qd 05/08/17  Yes Luking, Elayne Snare, MD  lisinopril-hydrochlorothiazide (PRINZIDE,ZESTORETIC) 10-12.5 MG tablet TAKE (1) TABLET BY MOUTH ONCE DAILY. 08/17/17  Yes Kathyrn Drown, MD  pravastatin (PRAVACHOL) 40 MG tablet 1 q evening 05/08/17  Yes Luking, Elayne Snare, MD  albuterol (PROVENTIL HFA;VENTOLIN HFA) 108 (90 Base) MCG/ACT inhaler Inhale 2 puffs into the lungs every 6 (six) hours as needed for wheezing or shortness of breath. 10/13/17   Mikey Kirschner, MD  aspirin EC 81 MG tablet Take 81 mg by mouth daily.      [provider]  clarithromycin (BIAXIN) 500 MG tablet One p o bid tn d 10/13/17   Mikey Kirschner, MD  doxycycline (VIBRA-TABS) 100 MG tablet Take 1 tablet (100 mg total) by mouth 2 (two) times daily. 10/17/17   Luking, Elayne Snare, MD  HUMIRA PEN 40 MG/0.8ML PNKT Inject 40 mg into the skin every 14 (fourteen) days. 11/05/16   [provider]  predniSONE (DELTASONE) 20 MG tablet Three qd for three d, two qd for three d, one qd for two d 10/13/17   Mikey Kirschner, MD  predniSONE (DELTASONE) 20 MG tablet Take 3 tabs for 3 days, then 2 tabs for 4 days, then one tab for 4 days 10/17/17   Kathyrn Drown, MD  varenicline (CHANTIX CONTINUING MONTH PAK) 1 MG tablet Take 1 tablet (1 mg total) by mouth 2 (two) times daily. 05/08/17   Kathyrn Drown, MD  varenicline (CHANTIX STARTING MONTH PAK) 0.5 MG X 11 & 1 MG X 42 tablet Take one 0.5 mg  tablet by mouth once daily for 3 days, then increase to one 0.5 mg tablet twice daily for 4 days, then increase to one 1 mg tablet twice daily. 05/08/17   Kathyrn Drown, MD    Family History Family History  Problem Relation Age of Onset  . Heart disease Father   . Hypertension Father   . Leukemia Father   . Anesthesia problems Neg Hx   . Hypotension Neg Hx   . Malignant hyperthermia Neg Hx   . Pseudochol deficiency Neg Hx     Social History Social History   Tobacco Use  . Smoking status: Current Every Day Smoker    Packs/day: 0.50    Years: 20.00    Pack years: 10.00    Types: Cigarettes  . Smokeless tobacco: Never Used  Substance Use Topics  . Alcohol use: No  . Drug use: No     Allergies   Amoxicillin-pot clavulanate; Levofloxacin; and Zithromax [azithromycin]   Review of  Systems Review of Systems  Cardiovascular: Positive for chest pain.  All other systems reviewed and are negative.    Physical Exam Updated Vital Signs BP 120/83   Pulse 66   Temp 98.2 F (36.8 C) (Oral)   Resp 19   Ht 5\' 10"  (1.778 m)   Wt 94.3 kg   SpO2 97%   BMI 29.84 kg/m   Physical Exam  Constitutional: He is oriented to person, place, and time. He appears well-developed and well-nourished.  HENT:  Head: Normocephalic and atraumatic.  Eyes: Pupils are equal, round, and reactive to light. EOM are normal.  Neck: Normal range of motion. Neck supple.  Cardiovascular: Normal rate, regular rhythm, intact distal pulses and normal pulses.  Pulmonary/Chest: Effort normal and breath sounds normal.  Abdominal: Soft. Bowel sounds are normal.  Musculoskeletal: Normal range of motion.       Right lower leg: Normal.       Left lower leg: Normal.  Neurological: He is alert and oriented to person, place, and time.  Skin: Skin is warm and dry. Capillary refill takes less than 2 seconds.  Psychiatric: He has a normal mood and affect. His behavior is normal.  Nursing note and vitals reviewed.    ED Treatments / Results  Labs (all labs ordered are listed, but only abnormal results are displayed) Labs Reviewed  CBC - Abnormal; Notable for the following components:      Result Value   WBC 11.3 (*)    All other components within normal limits  BASIC METABOLIC PANEL  I-STAT TROPONIN, ED  I-STAT TROPONIN, ED    EKG EKG Interpretation  Date/Time:  Friday October 27 2017 16:55:29 EDT Ventricular Rate:  70 PR Interval:    QRS Duration: 102 QT Interval:  389 QTC Calculation: 420 R Axis:   57 Text Interpretation:  Sinus rhythm No significant change since last tracing Confirmed by Isla Pence [phone]) on 10/27/2017 5:03:19 PM   Radiology Dg Chest 2 View  Result Date: 10/27/2017 CLINICAL DATA:  Chest pain.  ST-elevation. EXAM: CHEST - 2 VIEW COMPARISON:  March 12, 2012  FINDINGS: The heart size and mediastinal contours are within normal limits. Both lungs are clear. The visualized skeletal structures are unremarkable. IMPRESSION: No active cardiopulmonary disease. Electronically Signed   By: Dorise Bullion III M.D   On: 10/27/2017 18:30    Procedures Procedures (including critical care time)  Medications Ordered in ED Medications  nitroGLYCERIN (NITROSTAT) SL tablet 0.4 mg (0.4 mg Sublingual Given 10/27/17  1748)  aspirin chewable tablet 324 mg (324 mg Oral Given 10/27/17 1709)     Initial Impression / Assessment and Plan / ED Course  I have reviewed the triage vital signs and the nursing notes.  Pertinent labs & imaging results that were available during my care of the patient were reviewed by me and considered in my medical decision making (see chart for details).   Pt has a heart score of 5.  Pt's pain is gone with nitro.    He has never had a cardiac eval and has multiple risk factors.  Pt d/w Dr. Olevia Bowens ([facility]) for admission.  Final Clinical Impressions(s) / ED Diagnoses   Final diagnoses:  Chest pain, unspecified type    ED Discharge Orders    None       Isla Pence, MD 10/27/17 [phone]

## 2017-10-27 NOTE — ED Notes (Signed)
Call for report RN unaware she is to get a patient and will call back

## 2017-10-27 NOTE — ED Notes (Signed)
Report to Elmyra Ricks, RN  Pt to floor

## 2017-10-27 NOTE — ED Notes (Signed)
Pt reports intermittent CP since Tuesday   Current pain 2/10

## 2017-10-28 ENCOUNTER — Observation Stay (HOSPITAL_COMMUNITY): Payer: Commercial Managed Care - PPO

## 2017-10-28 DIAGNOSIS — K219 Gastro-esophageal reflux disease without esophagitis: Secondary | ICD-10-CM | POA: Diagnosis not present

## 2017-10-28 DIAGNOSIS — E7849 Other hyperlipidemia: Secondary | ICD-10-CM

## 2017-10-28 DIAGNOSIS — R079 Chest pain, unspecified: Secondary | ICD-10-CM | POA: Diagnosis not present

## 2017-10-28 DIAGNOSIS — I10 Essential (primary) hypertension: Secondary | ICD-10-CM

## 2017-10-28 LAB — LIPID PANEL
Cholesterol: 137 mg/dL (ref 0–200)
HDL: 27 mg/dL — AB (ref >40)
LDL CALC: 76 mg/dL (ref 0–99)
Total CHOL/HDL Ratio: 5.1 RATIO
Triglycerides: 169 mg/dL — ABNORMAL HIGH (ref <150)
VLDL: 34 mg/dL (ref 0–40)

## 2017-10-28 LAB — TROPONIN I: TROPONIN I: 0.03 ng/mL — AB (ref <0.03)

## 2017-10-28 MED ORDER — ASPIRIN EC 81 MG PO TBEC: 81.0000 mg | DELAYED_RELEASE_TABLET | Freq: Every day | ORAL | Status: DC

## 2017-10-28 NOTE — Progress Notes (Signed)
CRITICAL VALUE ALERT  Critical Value:  Troponin 0.03  Date & Time Notied:  10/28/17 4360  Provider Notified: Olevia Bowens, MD  Orders Received/Actions taken: awaiting response.

## 2017-10-28 NOTE — Discharge Summary (Signed)
Physician Discharge Summary  Brian Brewer FYB:[id number] DOB: November 01, 1965 DOA: 10/27/2017  PCP: Kathyrn Drown, MD  Admit date: 10/27/2017 Discharge date: 10/28/2017  Admitted From: Home Disposition: Home  Recommendations for Outpatient Follow-up:  1. Follow up with PCP in 1-2 weeks 2. Please obtain BMP/CBC in one week 3. Consider outpatient referral to cardiology if symptoms recur  Discharge Condition: Stable CODE STATUS: Full code Diet recommendation: Heart healthy  Brief/Interim Summary: 52 year old male with a history of GERD, hyperlipidemia, hypertension, presents to the hospital with complaints of intermittent chest pain.  This is significantly worse when he coughs or takes a deep breath.  He reports having upper respiratory tract infection for which she received antibiotics and prednisone.  Overall his respiratory symptoms are better, but he still does have some pain, which is worse with cough.  He was admitted to the hospital where he ruled out for ACS with negative cardiac markers.  He did not have any acute changes on EKG.  Patient is feeling significantly better at this point he does not have any symptoms.  Symptoms appear to be very atypical.  I suspect that he may have an element of pleurisy.  Last cardiology evaluation was from 2014 where he had a stress echo that was normal.  If he has any recurrence of his symptoms, could consider referring him back to the cardiology clinic.  Patient is otherwise feeling better and eager to discharge home.  Discharge Diagnoses:  Principal Problem:   Chest pain Active Problems:   GERD   HTN (hypertension), benign   Hyperlipemia   Psoriatic arthritis (HCC)   Hyperuricemia   Bronchitis due to tobacco use    Discharge Instructions  Discharge Instructions    Diet - low sodium heart healthy   Complete by:  As directed    Increase activity slowly   Complete by:  As directed      Allergies as of 10/28/2017      Reactions    Amoxicillin-pot Clavulanate Other (See Comments)   Cramps and diarrhea Has patient had a PCN reaction causing immediate rash, facial/tongue/throat swelling, SOB or lightheadedness with hypotension: No Has patient had a PCN reaction causing severe rash involving mucus membranes or skin necrosis: No Has patient had a PCN reaction that required hospitalization: No Has patient had a PCN reaction occurring within the last 10 years: No If all of the above answers are "NO", then may proceed with Cephalosporin use.   Levofloxacin Other (See Comments)   Passed out (possibly due to medication)   Zithromax [azithromycin] Diarrhea      Medication List    STOP taking these medications   clarithromycin 500 MG tablet Commonly known as:  BIAXIN   predniSONE 20 MG tablet Commonly known as:  DELTASONE     TAKE these medications   albuterol 108 (90 Base) MCG/ACT inhaler Commonly known as:  PROVENTIL HFA;VENTOLIN HFA Inhale 2 puffs into the lungs every 6 (six) hours as needed for wheezing or shortness of breath.   allopurinol 100 MG tablet Commonly known as:  ZYLOPRIM TAKE (1) TABLET BY MOUTH ONCE DAILY. What changed:  See the new instructions.   aspirin EC 81 MG tablet Take 1 tablet (81 mg total) by mouth daily.   esomeprazole 40 MG capsule Commonly known as:  NEXIUM 1 qd What changed:    how much to take  how to take this  when to take this   lisinopril-hydrochlorothiazide 10-12.5 MG tablet Commonly known as:  PRINZIDE,ZESTORETIC TAKE (  1) TABLET BY MOUTH ONCE DAILY. What changed:  See the new instructions.   pravastatin 40 MG tablet Commonly known as:  PRAVACHOL 1 q evening What changed:    how much to take  how to take this  when to take this   varenicline 0.5 MG X 11 & 1 MG X 42 tablet Commonly known as:  CHANTIX PAK Take one 0.5 mg tablet by mouth once daily for 3 days, then increase to one 0.5 mg tablet twice daily for 4 days, then increase to one 1 mg tablet twice  daily. What changed:    how much to take  how to take this  when to take this       Allergies  Allergen Reactions  . Amoxicillin-Pot Clavulanate Other (See Comments)    Cramps and diarrhea Has patient had a PCN reaction causing immediate rash, facial/tongue/throat swelling, SOB or lightheadedness with hypotension: No Has patient had a PCN reaction causing severe rash involving mucus membranes or skin necrosis: No Has patient had a PCN reaction that required hospitalization: No Has patient had a PCN reaction occurring within the last 10 years: No  If all of the above answers are "NO", then may proceed with Cephalosporin use.  . Levofloxacin Other (See Comments)    Passed out (possibly due to medication)  . Zithromax [Azithromycin] Diarrhea    Consultations:     Procedures/Studies: Dg Chest 2 View  Result Date: 10/27/2017 CLINICAL DATA:  Chest pain.  ST-elevation. EXAM: CHEST - 2 VIEW COMPARISON:  March 12, 2012 FINDINGS: The heart size and mediastinal contours are within normal limits. Both lungs are clear. The visualized skeletal structures are unremarkable. IMPRESSION: No active cardiopulmonary disease. Electronically Signed   By: Dorise Bullion III M.D   On: 10/27/2017 18:30       Subjective: No chest pain or shortness of breath  Discharge Exam: Vitals:   10/27/17 1900 10/27/17 1930 10/27/17 2100 10/28/17 0621  BP: (!) 142/77 123/75 133/89 139/88  Pulse: 64 68 73 (!) 55  Resp: 18 18 18 18   Temp:   98.4 F (36.9 C) 98.4 F (36.9 C)  TempSrc:   Oral Oral  SpO2: 97% 95% 97% 95%  Weight:      Height:        General: Pt is alert, awake, not in acute distress Cardiovascular: RRR, S1/S2 +, no rubs, no gallops Respiratory: CTA bilaterally, no wheezing, no rhonchi Abdominal: Soft, NT, ND, bowel sounds + Extremities: no edema, no cyanosis    The results of significant diagnostics from this hospitalization (including imaging, microbiology, ancillary and  laboratory) are listed below for reference.     Microbiology: No results found for this or any previous visit (from the past 240 hour(s)).   Labs: BNP (last 3 results) No results for input(s): BNP in the last 8760 hours. Basic Metabolic Panel: Recent Labs  Lab 10/27/17 1657  NA 135  K 3.6  CL 102  CO2 26  GLUCOSE 80  BUN 14  CREATININE 0.99  CALCIUM 9.1  MG 2.2   Liver Function Tests: Recent Labs  Lab 10/27/17 1657  AST 22  ALT 26  ALKPHOS 58  BILITOT 0.5  PROT 7.1  ALBUMIN 4.1   No results for input(s): LIPASE, AMYLASE in the last 168 hours. No results for input(s): AMMONIA in the last 168 hours. CBC: Recent Labs  Lab 10/27/17 1657  WBC 11.3*  HGB 16.0  HCT 45.9  MCV 90.0  PLT 210  Cardiac Enzymes: Recent Labs  Lab 10/27/17 2242 10/28/17 0516 10/28/17 1329  TROPONINI <0.03 0.03* <0.03   BNP: Invalid input(s): POCBNP CBG: No results for input(s): GLUCAP in the last 168 hours. D-Dimer No results for input(s): DDIMER in the last 72 hours. Hgb A1c No results for input(s): HGBA1C in the last 72 hours. Lipid Profile Recent Labs    10/28/17 0516  CHOL 137  HDL 27*  LDLCALC 76  TRIG 169*  CHOLHDL 5.1   Thyroid function studies No results for input(s): TSH, T4TOTAL, T3FREE, THYROIDAB in the last 72 hours.  Invalid input(s): FREET3 Anemia work up No results for input(s): VITAMINB12, FOLATE, FERRITIN, TIBC, IRON, RETICCTPCT in the last 72 hours. Urinalysis No results found for: COLORURINE, APPEARANCEUR, LABSPEC, Hypoluxo, GLUCOSEU, HGBUR, BILIRUBINUR, KETONESUR, PROTEINUR, UROBILINOGEN, NITRITE, LEUKOCYTESUR Sepsis Labs Invalid input(s): PROCALCITONIN,  WBC,  LACTICIDVEN Microbiology No results found for this or any previous visit (from the past 240 hour(s)).   Time coordinating discharge: 28mins  SIGNED:   Kathie Dike, MD  [facility] 10/28/2017, 7:20 PM Pager   If 7PM-7AM, please contact  night-coverage [email or web address] Password [email or web address]

## 2017-10-28 NOTE — Progress Notes (Signed)
Pt discharged in stable condition into the care of his family via private vehicle.  PIV removed intact w/o S&S of complications.  Discharge instructions reviewed with pt/family.  Pt/family verbalized understanding.

## 2017-10-28 NOTE — Plan of Care (Signed)
Adequate for discharge.

## 2017-10-29 LAB — HIV ANTIBODY (ROUTINE TESTING W REFLEX): HIV Screen 4th Generation wRfx: NONREACTIVE

## 2017-10-31 ENCOUNTER — Encounter: Payer: Self-pay | Admitting: Family Medicine

## 2017-11-10 ENCOUNTER — Ambulatory Visit: Payer: Commercial Managed Care - PPO | Admitting: Family Medicine

## 2017-11-10 ENCOUNTER — Encounter: Payer: Self-pay | Admitting: Family Medicine

## 2017-11-10 VITALS — Ht 69.5 in | Wt 205.0 lb

## 2017-11-10 DIAGNOSIS — Z0000 Encounter for general adult medical examination without abnormal findings: Secondary | ICD-10-CM

## 2017-11-10 DIAGNOSIS — Z125 Encounter for screening for malignant neoplasm of prostate: Secondary | ICD-10-CM | POA: Diagnosis not present

## 2017-11-10 DIAGNOSIS — Z23 Encounter for immunization: Secondary | ICD-10-CM

## 2017-11-10 MED ORDER — LISINOPRIL-HYDROCHLOROTHIAZIDE 10-12.5 MG PO TABS: 1.0000 | ORAL_TABLET | Freq: Every day | ORAL | 6 refills | Status: DC

## 2017-11-10 MED ORDER — PRAVASTATIN SODIUM 40 MG PO TABS: 40.0000 mg | ORAL_TABLET | Freq: Every evening | ORAL | 6 refills | Status: DC

## 2017-11-10 MED ORDER — ESOMEPRAZOLE MAGNESIUM 40 MG PO CPDR: 40.0000 mg | DELAYED_RELEASE_CAPSULE | Freq: Every day | ORAL | 6 refills | Status: DC

## 2017-11-10 NOTE — Patient Instructions (Signed)
DASH Eating Plan DASH stands for "Dietary Approaches to Stop Hypertension." The DASH eating plan is a healthy eating plan that has been shown to reduce high blood pressure (hypertension). It may also reduce your risk for type 2 diabetes, heart disease, and stroke. The DASH eating plan may also help with weight loss. What are tips for following this plan? General guidelines  Avoid eating more than 2,300 mg (milligrams) of salt (sodium) a day. If you have hypertension, you may need to reduce your sodium intake to 1,500 mg a day.  Limit alcohol intake to no more than 1 drink a day for nonpregnant women and 2 drinks a day for men. One drink equals 12 oz of beer, 5 oz of wine, or 1 oz of hard liquor.  Work with your health care provider to maintain a healthy body weight or to lose weight. Ask what an ideal weight is for you.  Get at least 30 minutes of exercise that causes your heart to beat faster (aerobic exercise) most days of the week. Activities may include walking, swimming, or biking.  Work with your health care provider or diet and nutrition specialist (dietitian) to adjust your eating plan to your individual calorie needs. Reading food labels  Check food labels for the amount of sodium per serving. Choose foods with less than 5 percent of the Daily Value of sodium. Generally, foods with less than 300 mg of sodium per serving fit into this eating plan.  To find whole grains, look for the word "whole" as the first word in the ingredient list. Shopping  Buy products labeled as "low-sodium" or "no salt added."  Buy fresh foods. Avoid canned foods and premade or frozen meals. Cooking  Avoid adding salt when cooking. Use salt-free seasonings or herbs instead of table salt or sea salt. Check with your health care provider or pharmacist before using salt substitutes.  Do not fry foods. Cook foods using healthy methods such as baking, boiling, grilling, and broiling instead.  Cook with  heart-healthy oils, such as olive, canola, soybean, or sunflower oil. Meal planning   Eat a balanced diet that includes: ? 5 or more servings of fruits and vegetables each day. At each meal, try to fill half of your plate with fruits and vegetables. ? Up to 6-8 servings of whole grains each day. ? Less than 6 oz of lean meat, poultry, or fish each day. A 3-oz serving of meat is about the same size as a deck of cards. One egg equals 1 oz. ? 2 servings of low-fat dairy each day. ? A serving of nuts, seeds, or beans 5 times each week. ? Heart-healthy fats. Healthy fats called Omega-3 fatty acids are found in foods such as flaxseeds and coldwater fish, like sardines, salmon, and mackerel.  Limit how much you eat of the following: ? Canned or prepackaged foods. ? Food that is high in trans fat, such as fried foods. ? Food that is high in saturated fat, such as fatty meat. ? Sweets, desserts, sugary drinks, and other foods with added sugar. ? Full-fat dairy products.  Do not salt foods before eating.  Try to eat at least 2 vegetarian meals each week.  Eat more home-cooked food and less restaurant, buffet, and fast food.  When eating at a restaurant, ask that your food be prepared with less salt or no salt, if possible. What foods are recommended? The items listed may not be a complete list. Talk with your dietitian about what   dietary choices are best for you. Grains Whole-grain or whole-wheat bread. Whole-grain or whole-wheat pasta. Brown rice. Oatmeal. Quinoa. Bulgur. Whole-grain and low-sodium cereals. Pita bread. Low-fat, low-sodium crackers. Whole-wheat flour tortillas. Vegetables Fresh or frozen vegetables (raw, steamed, roasted, or grilled). Low-sodium or reduced-sodium tomato and vegetable juice. Low-sodium or reduced-sodium tomato sauce and tomato paste. Low-sodium or reduced-sodium canned vegetables. Fruits All fresh, dried, or frozen fruit. Canned fruit in natural juice (without  added sugar). Meat and other protein foods Skinless chicken or turkey. Ground chicken or turkey. Pork with fat trimmed off. Fish and seafood. Egg whites. Dried beans, peas, or lentils. Unsalted nuts, nut butters, and seeds. Unsalted canned beans. Lean cuts of beef with fat trimmed off. Low-sodium, lean deli meat. Dairy Low-fat (1%) or fat-free (skim) milk. Fat-free, low-fat, or reduced-fat cheeses. Nonfat, low-sodium ricotta or cottage cheese. Low-fat or nonfat yogurt. Low-fat, low-sodium cheese. Fats and oils Soft margarine without trans fats. Vegetable oil. Low-fat, reduced-fat, or light mayonnaise and salad dressings (reduced-sodium). Canola, safflower, olive, soybean, and sunflower oils. Avocado. Seasoning and other foods Herbs. Spices. Seasoning mixes without salt. Unsalted popcorn and pretzels. Fat-free sweets. What foods are not recommended? The items listed may not be a complete list. Talk with your dietitian about what dietary choices are best for you. Grains Baked goods made with fat, such as croissants, muffins, or some breads. Dry pasta or rice meal packs. Vegetables Creamed or fried vegetables. Vegetables in a cheese sauce. Regular canned vegetables (not low-sodium or reduced-sodium). Regular canned tomato sauce and paste (not low-sodium or reduced-sodium). Regular tomato and vegetable juice (not low-sodium or reduced-sodium). Pickles. Olives. Fruits Canned fruit in a light or heavy syrup. Fried fruit. Fruit in cream or butter sauce. Meat and other protein foods Fatty cuts of meat. Ribs. Fried meat. Bacon. Sausage. Bologna and other processed lunch meats. Salami. Fatback. Hotdogs. Bratwurst. Salted nuts and seeds. Canned beans with added salt. Canned or smoked fish. Whole eggs or egg yolks. Chicken or turkey with skin. Dairy Whole or 2% milk, cream, and half-and-half. Whole or full-fat cream cheese. Whole-fat or sweetened yogurt. Full-fat cheese. Nondairy creamers. Whipped toppings.  Processed cheese and cheese spreads. Fats and oils Butter. Stick margarine. Lard. Shortening. Ghee. Bacon fat. Tropical oils, such as coconut, palm kernel, or palm oil. Seasoning and other foods Salted popcorn and pretzels. Onion salt, garlic salt, seasoned salt, table salt, and sea salt. Worcestershire sauce. Tartar sauce. Barbecue sauce. Teriyaki sauce. Soy sauce, including reduced-sodium. Steak sauce. Canned and packaged gravies. Fish sauce. Oyster sauce. Cocktail sauce. Horseradish that you find on the shelf. Ketchup. Mustard. Meat flavorings and tenderizers. Bouillon cubes. Hot sauce and Tabasco sauce. Premade or packaged marinades. Premade or packaged taco seasonings. Relishes. Regular salad dressings. Where to find more information:  National Heart, Lung, and Blood Institute: www.nhlbi.nih.gov  American Heart Association: www.heart.org Summary  The DASH eating plan is a healthy eating plan that has been shown to reduce high blood pressure (hypertension). It may also reduce your risk for type 2 diabetes, heart disease, and stroke.  With the DASH eating plan, you should limit salt (sodium) intake to 2,300 mg a day. If you have hypertension, you may need to reduce your sodium intake to 1,500 mg a day.  When on the DASH eating plan, aim to eat more fresh fruits and vegetables, whole grains, lean proteins, low-fat dairy, and heart-healthy fats.  Work with your health care provider or diet and nutrition specialist (dietitian) to adjust your eating plan to your individual   calorie needs. This information is not intended to replace advice given to you by your health care provider. Make sure you discuss any questions you have with your health care provider. Document Released: 01/06/2011 Document Revised: 01/11/2016 Document Reviewed: 01/11/2016 Elsevier Interactive Patient Education  2018 Elsevier Inc.  

## 2017-11-10 NOTE — Progress Notes (Addendum)
   Subjective:    Patient ID: Brian Brewer, male    DOB: [birth year], 52 y.o.   MRN: [id number]  HPI The patient comes in today for a wellness visit.  Patient was recently seen in the hospital Recently seen in the office Evaluation for chest pain Felt to be possibly due to side effects of prednisone and doxycycline Was treated in the hospital release No longer having any type of chest tightness pressure pain or shortness of breath   A review of their health history was completed.  A review of medications was also completed.  Any needed refills; up date all meds  Eating habits: health conscious  Falls/  MVA accidents in past few months: none  Regular exercise: a lot of walking  Specialist pt sees on regular basis: [facility] - arthritis  Preventative health issues were discussed.   Additional concerns: none  Flu vaccine today.   Patient denies any type of chest tightness pressure pain Review of Systems  Constitutional: Negative for activity change, appetite change and fever.  HENT: Negative for congestion and rhinorrhea.   Eyes: Negative for discharge.  Respiratory: Negative for cough and wheezing.   Cardiovascular: Negative for chest pain.  Gastrointestinal: Negative for abdominal pain, blood in stool and vomiting.  Genitourinary: Negative for difficulty urinating and frequency.  Musculoskeletal: Negative for neck pain.  Skin: Negative for rash.  Allergic/Immunologic: Negative for environmental allergies and food allergies.  Neurological: Negative for weakness and headaches.  Psychiatric/Behavioral: Negative for agitation.       Objective:   Physical Exam  Constitutional: He appears well-developed and well-nourished.  HENT:  Head: Normocephalic and atraumatic.  Right Ear: External ear normal.  Left Ear: External ear normal.  Nose: Nose normal.  Mouth/Throat: Oropharynx is clear and moist.  Eyes: Pupils are equal, round, and reactive to light. EOM  are normal.  Neck: Normal range of motion. Neck supple. No thyromegaly present.  Cardiovascular: Normal rate, regular rhythm and normal heart sounds.  No murmur heard. Pulmonary/Chest: Effort normal and breath sounds normal. No respiratory distress. He has no wheezes.  Abdominal: Soft. Bowel sounds are normal. He exhibits no distension and no mass. There is no tenderness.  Genitourinary: Penis normal.  Musculoskeletal: Normal range of motion. He exhibits no edema.  Lymphadenopathy:    He has no cervical adenopathy.  Neurological: He is alert. He exhibits normal muscle tone.  Skin: Skin is warm and dry. No erythema.  Psychiatric: He has a normal mood and affect. His behavior is normal. Judgment normal.    Prostate exam normal rectal normal cardiac exam normal      Assessment & Plan:  Adult wellness-complete.wellness physical was conducted today. Importance of diet and exercise were discussed in detail.  In addition to this a discussion regarding safety was also covered. We also reviewed over immunizations and gave recommendations regarding current immunization needed for age.  In addition to this additional areas were also touched on including: Preventative health exams needed:  Colonoscopy up-to-date she needs  Patient was advised yearly wellness exam Patient will follow-up in the spring for blood pressure issues Patient will do excellent job of regular activity Patient gets regular blood work through his rheumatologist no additional lab work needed PSA ordered We talked about at length about the importance of him quitting smoking

## 2017-11-12 ENCOUNTER — Encounter: Payer: Self-pay | Admitting: Family Medicine

## 2017-11-12 LAB — PSA: Prostate Specific Ag, Serum: 0.6 ng/mL (ref 0.0–4.0)

## 2018-03-15 ENCOUNTER — Encounter: Payer: Self-pay | Admitting: Family Medicine

## 2018-03-15 ENCOUNTER — Telehealth: Payer: Self-pay | Admitting: Family Medicine

## 2018-03-15 NOTE — Telephone Encounter (Signed)
Pt went to dentist and they are stating where he snores bad and is not getting rest at night he needs a sleep test ordered  CB# [phone]

## 2018-03-15 NOTE — Telephone Encounter (Signed)
Please advise. Thank you

## 2018-03-16 NOTE — Telephone Encounter (Signed)
I would like for this patient to do a [place] questionnaire If the patient could complete this as quickly as possible and get back to [place] and then we can do a follow-up office visit and get him set up for a sleep study

## 2018-03-16 NOTE — Telephone Encounter (Signed)
Contacted patient. Pt states he does not work in the area and when he gets off of work our office is closed. Pt wanted to receive the questionnare by email. Nurse advised that we can not send email but I could mail the paper to his home. Pt stated that was ok. Pt informed that he could have someone come by office and pick it up if need be. Will mail to patient. Pt informed that he could fax, mail or drop off form when completed and then he can set up an appt. Pt verbalized understanding

## 2018-03-22 NOTE — Telephone Encounter (Signed)
Pt faxed [employer] over. Form in provider office. Please advise. Thank you

## 2018-03-23 ENCOUNTER — Other Ambulatory Visit: Payer: Self-pay | Admitting: Family Medicine

## 2018-03-23 DIAGNOSIS — G4730 Sleep apnea, unspecified: Secondary | ICD-10-CM

## 2018-03-23 NOTE — Telephone Encounter (Signed)
Split sleep study ordered and pt is aware. Pt verbalized understanding.

## 2018-03-23 NOTE — Telephone Encounter (Signed)
Arby Barrette questionnaire shows possibility of sleep apnea I recommend ordering sleep study split protocol if possible.  Reason possible sleep apnea see Arby Barrette questionnaire. Arby Barrette questionnaire will be scanned into the system.

## 2018-04-11 ENCOUNTER — Other Ambulatory Visit: Payer: Self-pay

## 2018-04-11 ENCOUNTER — Ambulatory Visit: Payer: Commercial Managed Care - PPO | Attending: Family Medicine | Admitting: Neurology

## 2018-04-11 DIAGNOSIS — G4730 Sleep apnea, unspecified: Secondary | ICD-10-CM | POA: Diagnosis present

## 2018-04-17 NOTE — Procedures (Signed)
Bowers A. Merlene Laughter, MD     [email or web address]             NOCTURNAL POLYSOMNOGRAPHY   LOCATION: [facility]  Patient Name: Brian Brewer, Brian Brewer Date: 04/11/2018 Gender: Male D.O.B: 1965/09/16 Age (years): 38 Referring Provider: Sallee Lange Height (inches): 70 Interpreting Physician: Phillips Odor MD, ABSM Weight (lbs): 205 RPSGT: Peak, Robert BMI: 30 MRN: [id number] Neck Size: 16.50 CLINICAL INFORMATION Sleep Study Type: NPSG     Indication for sleep study: OSA     Epworth Sleepiness Score: 10     SLEEP STUDY TECHNIQUE As per the AASM Manual for the Scoring of Sleep and Associated Events v2.3 (April 2016) with a hypopnea requiring 4% desaturations.  The channels recorded and monitored were frontal, central and occipital EEG, electrooculogram (EOG), submentalis EMG (chin), nasal and oral airflow, thoracic and abdominal wall motion, anterior tibialis EMG, snore microphone, electrocardiogram, and pulse oximetry.  MEDICATIONS Medications self-administered by patient taken the night of the study : N/A  Current Outpatient Medications:  .  albuterol (PROVENTIL HFA;VENTOLIN HFA) 108 (90 Base) MCG/ACT inhaler, Inhale 2 puffs into the lungs every 6 (six) hours as needed for wheezing or shortness of breath. (Patient not taking: Reported on 11/10/2017), Disp: 1 Inhaler, Rfl: 2 .  allopurinol (ZYLOPRIM) 100 MG tablet, TAKE (1) TABLET BY MOUTH ONCE DAILY. (Patient taking differently: Take 100 mg by mouth daily. TAKE (1) TABLET BY MOUTH ONCE DAILY.), Disp: 30 tablet, Rfl: 2 .  aspirin EC 81 MG tablet, Take 1 tablet (81 mg total) by mouth daily., Disp: , Rfl:  .  esomeprazole (NEXIUM) 40 MG capsule, Take 1 capsule (40 mg total) by mouth daily. 1 qd, Disp: 30 capsule, Rfl: 6 .  lisinopril-hydrochlorothiazide (PRINZIDE,ZESTORETIC) 10-12.5 MG tablet, Take 1 tablet by mouth daily. TAKE (1) TABLET BY MOUTH ONCE DAILY., Disp: 30 tablet, Rfl: 6 .  pravastatin  (PRAVACHOL) 40 MG tablet, Take 1 tablet (40 mg total) by mouth every evening. 1 q evening, Disp: 30 tablet, Rfl: 6 .  varenicline (CHANTIX STARTING MONTH PAK) 0.5 MG X 11 & 1 MG X 42 tablet, Take one 0.5 mg tablet by mouth once daily for 3 days, then increase to one 0.5 mg tablet twice daily for 4 days, then increase to one 1 mg tablet twice daily. (Patient not taking: Reported on 11/10/2017), Disp: 53 tablet, Rfl: 0    SLEEP ARCHITECTURE The study was initiated at 9:19:07 PM and ended at 4:02:23 AM.  Sleep onset time was 27.1 minutes and the sleep efficiency was 73.4%%. The total sleep time was 296 minutes.  Stage REM latency was 104.5 minutes.  The patient spent 10.8%% of the night in stage N1 sleep, 68.9%% in stage N2 sleep, 5.2%% in stage N3 and 15% in REM.  Alpha intrusion was absent.  Supine sleep was 24.42%.  RESPIRATORY PARAMETERS The overall apnea/hypopnea index (AHI) was 7.1 per hour. There were 1 total apneas, including 0 obstructive, 1 central and 0 mixed apneas. There were 34 hypopneas and 64 RERAs.  The AHI during Stage REM sleep was 10.8 per hour.  AHI while supine was 18.3 per hour.  The mean oxygen saturation was 92.5%. The minimum SpO2 during sleep was 88.0%.  moderate snoring was noted during this study.  CARDIAC DATA The 2 lead EKG demonstrated sinus rhythm. The mean heart rate was 64.4 beats per minute. Other EKG findings include: PVCs.  LEG MOVEMENT DATA Mild periodic limb movements are noted.  IMPRESSIONS  1.  Mild  obstructive sleep apnea syndrome is documented with this study.  The severity does not require positive pressure treatment. 2.  Mild periodic limb movements are also noted.  Delano Metz, MD Diplomate, American Board of Sleep Medicine. ELECTRONICALLY SIGNED ON:  04/17/2018, 5:50 PM [place] PH: [phone]   FX: [phone] [place]

## 2018-04-29 NOTE — Progress Notes (Signed)
The sleep study does not show the need for CPAP machine He does have mild sleep apnea but it is not of the level from a clinical guideline to treat Patient has regular follow-up mid April

## 2018-04-30 ENCOUNTER — Telehealth: Payer: Self-pay | Admitting: *Deleted

## 2018-04-30 NOTE — Telephone Encounter (Signed)
We can move his visit to be a virtual visit this week and discuss it further If he would like to do it this afternoon I am available

## 2018-04-30 NOTE — Telephone Encounter (Signed)
Per Dr Nicki Reaper: The sleep study does not show the need for CPAP machine He does have mild sleep apnea but it is not of the level from a clinical guideline to treat Patient has regular follow-up mid April

## 2018-04-30 NOTE — Telephone Encounter (Signed)
Left message to return call- Patient needs telephone visit

## 2018-04-30 NOTE — Telephone Encounter (Signed)
Patient states he is still no getting rest and needs to know what is going on and what he needs to do

## 2018-05-08 NOTE — Telephone Encounter (Signed)
Pt notified and scheduled appt with dr Nicki Reaper

## 2018-05-08 NOTE — Telephone Encounter (Signed)
Mailbox is full, unable to leave message.

## 2018-05-09 ENCOUNTER — Encounter: Payer: Self-pay | Admitting: Family Medicine

## 2018-05-09 ENCOUNTER — Other Ambulatory Visit: Payer: Self-pay

## 2018-05-09 ENCOUNTER — Ambulatory Visit (INDEPENDENT_AMBULATORY_CARE_PROVIDER_SITE_OTHER): Payer: Commercial Managed Care - PPO | Admitting: Family Medicine

## 2018-05-09 DIAGNOSIS — I10 Essential (primary) hypertension: Secondary | ICD-10-CM

## 2018-05-09 DIAGNOSIS — E7849 Other hyperlipidemia: Secondary | ICD-10-CM | POA: Diagnosis not present

## 2018-05-09 MED ORDER — ESOMEPRAZOLE MAGNESIUM 40 MG PO CPDR: 40.0000 mg | DELAYED_RELEASE_CAPSULE | Freq: Every day | ORAL | 6 refills | Status: DC

## 2018-05-09 MED ORDER — PRAVASTATIN SODIUM 40 MG PO TABS: 40.0000 mg | ORAL_TABLET | Freq: Every evening | ORAL | 6 refills | Status: DC

## 2018-05-09 MED ORDER — LISINOPRIL-HYDROCHLOROTHIAZIDE 10-12.5 MG PO TABS: 1.0000 | ORAL_TABLET | Freq: Every day | ORAL | 6 refills | Status: DC

## 2018-05-09 NOTE — Progress Notes (Signed)
   Subjective:    Patient ID: Brian Brewer, male    DOB: 03-Aug-1965, 53 y.o.   MRN: [id number]  HPI phone visit only was unable to be video Patient was at work only able to do telephone visit  We had a good discussion regarding his sleep apnea.  He does have mild sleep apnea by machine testing but it is not severe enough to utilize CPAP.  Also patient not interested in CPAP we had discussion about oral apparatuses to help with sleep apnea he will talk with his dentist about this I have also advised the patient to keep his weight down and try to sleep on his side none on his back  Patient does smoke we talked about quitting smoking Patient does use tobacco products.  Patient knows they should quit.  Patient is aware that smoking/in use of tobacco products increases their risk of heart disease, cancer, and COPD- lung issues.  Patient has been counseled to quit smoking/tobacco products.    Pt having video visit discussing sleep study. Pt had sleep study done on 04/11/2018.   Virtual Visit via Telephone Note  I connected with Brian Brewer on 05/09/18 at  9:30 AM EDT by telephone and verified that I am speaking with the correct person using two identifiers.   I discussed the limitations, risks, security and privacy concerns of performing an evaluation and management service by telephone and the availability of in person appointments. I also discussed with the patient that there may be a patient responsible charge related to this service. The patient expressed understanding and agreed to proceed.   History of Present Illness:    Observations/Objective:   Assessment and Plan:   Follow Up Instructions:    I discussed the assessment and treatment plan with the patient. The patient was provided an opportunity to ask questions and all were answered. The patient agreed with the plan and demonstrated an understanding of the instructions.   The patient was advised to call back or seek an  in-person evaluation if the symptoms worsen or if the condition fails to improve as anticipated.  I provided 15 minutes of non-face-to-face time during this encounter.   Vicente Males, LPN  Review of Systems Denies any chest tightness pressure pain shortness breath headache nausea vomiting or other symptoms    Objective:   Physical Exam  Unable to do physical exam via phone      Assessment & Plan:  Patient does have mild sleep apnea but is not severe enough to have to utilize a CPAP machine the patient will be talking with his dentist about oral apparatuses to help with this  Patient was counseled to quit smoking  Blood pressure good control continue current measures  Cholesterol continue medication watch diet previous labs reviewed Labs in the spring time follow-up in 6 months

## 2018-05-14 ENCOUNTER — Other Ambulatory Visit: Payer: Self-pay | Admitting: Family Medicine

## 2018-05-14 NOTE — Telephone Encounter (Signed)
Med check up was done this month

## 2018-05-14 NOTE — Telephone Encounter (Signed)
6 refills °

## 2018-05-18 ENCOUNTER — Ambulatory Visit: Payer: Commercial Managed Care - PPO | Admitting: Family Medicine

## 2018-07-16 ENCOUNTER — Other Ambulatory Visit (HOSPITAL_COMMUNITY): Payer: Self-pay | Admitting: Orthopaedic Surgery

## 2018-07-16 ENCOUNTER — Other Ambulatory Visit: Payer: Self-pay | Admitting: Orthopaedic Surgery

## 2018-07-16 DIAGNOSIS — M4326 Fusion of spine, lumbar region: Secondary | ICD-10-CM

## 2018-07-18 ENCOUNTER — Ambulatory Visit (HOSPITAL_COMMUNITY)
Admission: RE | Admit: 2018-07-18 | Discharge: 2018-07-18 | Disposition: A | Discharge status: Home / Self Care | Payer: Commercial Managed Care - PPO | Source: Ambulatory Visit | Attending: Orthopaedic Surgery | Admitting: Orthopaedic Surgery

## 2018-07-18 ENCOUNTER — Other Ambulatory Visit: Payer: Self-pay

## 2018-07-18 DIAGNOSIS — M4326 Fusion of spine, lumbar region: Secondary | ICD-10-CM | POA: Diagnosis not present

## 2018-07-18 LAB — POCT I-STAT CREATININE: Creatinine, Ser: 1 mg/dL (ref 0.61–1.24)

## 2018-07-18 MED ORDER — GADOBUTROL 1 MMOL/ML IV SOLN
10.0000 mL | Freq: Once | INTRAVENOUS | Status: AC | PRN
  Administered 2018-07-18: 10 mL via INTRAVENOUS

## 2018-08-05 ENCOUNTER — Telehealth: Payer: Self-pay | Admitting: Family Medicine

## 2018-08-05 NOTE — Telephone Encounter (Signed)
Patient has upcoming surgery potentially with the back specialist. They are requesting presurgical clearance This will require office visit in order to make sure parameters look reasonably good and to be able to grant surgical clearance Ideally this would be in the office If the patient needs this to be virtual this could be done but not ideal We could see him this week otherwise it would be later in July

## 2018-08-06 DIAGNOSIS — M5116 Intervertebral disc disorders with radiculopathy, lumbar region: Secondary | ICD-10-CM | POA: Insufficient documentation

## 2018-08-06 NOTE — Telephone Encounter (Signed)
Please call patient and set up appt. Thank you

## 2018-08-07 NOTE — Telephone Encounter (Signed)
Called to schedule office visit for surgical clearance, per pt's wife pt had the back surgery 08/06/2018  Told her we hope he's feeling better soon and to call us if they need anything, pt's wife verbalized understanding

## 2018-11-28 ENCOUNTER — Telehealth: Payer: Self-pay | Admitting: Nurse Practitioner

## 2018-11-28 ENCOUNTER — Other Ambulatory Visit: Payer: Self-pay | Admitting: Orthopaedic Surgery

## 2018-11-28 DIAGNOSIS — M4716 Other spondylosis with myelopathy, lumbar region: Secondary | ICD-10-CM

## 2018-11-28 NOTE — Telephone Encounter (Signed)
Phone call to patient to verify medication list and allergies for myelogram procedure. Pt aware he will not need to hold any medications for this procedure. Pre and post procedure instructions reviewed with pt. 

## 2018-12-06 ENCOUNTER — Ambulatory Visit
Admission: RE | Admit: 2018-12-06 | Discharge: 2018-12-06 | Disposition: A | Discharge status: Home / Self Care | Payer: Commercial Managed Care - PPO | Source: Ambulatory Visit | Attending: Orthopaedic Surgery | Admitting: Orthopaedic Surgery

## 2018-12-06 ENCOUNTER — Other Ambulatory Visit: Payer: Self-pay

## 2018-12-06 DIAGNOSIS — M4716 Other spondylosis with myelopathy, lumbar region: Secondary | ICD-10-CM

## 2018-12-06 MED ORDER — DIAZEPAM 5 MG PO TABS
10.0000 mg | ORAL_TABLET | Freq: Once | ORAL | Status: AC
  Administered 2018-12-06: 09:00:00 10 mg via ORAL

## 2018-12-06 MED ORDER — IOPAMIDOL (ISOVUE-M 200) INJECTION 41%
20.0000 mL | Freq: Once | INTRAMUSCULAR | Status: AC
  Administered 2018-12-06: 10:00:00 20 mL via INTRATHECAL

## 2018-12-06 NOTE — Discharge Instructions (Signed)

## 2018-12-10 ENCOUNTER — Other Ambulatory Visit: Payer: Self-pay | Admitting: Family Medicine

## 2018-12-10 NOTE — Telephone Encounter (Signed)
May have this +1 refill needs follow-up visit or virtual visit by the end of the year

## 2018-12-11 NOTE — Telephone Encounter (Signed)
Pt has follow up scheduled for Dec 8th

## 2019-01-08 ENCOUNTER — Encounter: Payer: Commercial Managed Care - PPO | Admitting: Family Medicine

## 2019-01-08 ENCOUNTER — Other Ambulatory Visit: Payer: Self-pay

## 2019-01-10 NOTE — Progress Notes (Signed)
This encounter was created in error - please disregard.

## 2019-01-15 ENCOUNTER — Other Ambulatory Visit: Payer: Self-pay

## 2019-01-15 ENCOUNTER — Ambulatory Visit (INDEPENDENT_AMBULATORY_CARE_PROVIDER_SITE_OTHER): Payer: Commercial Managed Care - PPO | Admitting: Family Medicine

## 2019-01-15 VITALS — BP 133/78

## 2019-01-15 DIAGNOSIS — Z125 Encounter for screening for malignant neoplasm of prostate: Secondary | ICD-10-CM

## 2019-01-15 DIAGNOSIS — I10 Essential (primary) hypertension: Secondary | ICD-10-CM | POA: Diagnosis not present

## 2019-01-15 DIAGNOSIS — L4050 Arthropathic psoriasis, unspecified: Secondary | ICD-10-CM

## 2019-01-15 DIAGNOSIS — E790 Hyperuricemia without signs of inflammatory arthritis and tophaceous disease: Secondary | ICD-10-CM

## 2019-01-15 DIAGNOSIS — E7849 Other hyperlipidemia: Secondary | ICD-10-CM

## 2019-01-15 MED ORDER — ALLOPURINOL 100 MG PO TABS: ORAL_TABLET | ORAL | 5 refills | Status: DC

## 2019-01-15 MED ORDER — PRAVASTATIN SODIUM 40 MG PO TABS: 40.0000 mg | ORAL_TABLET | Freq: Every evening | ORAL | 5 refills | Status: DC

## 2019-01-15 MED ORDER — LISINOPRIL-HYDROCHLOROTHIAZIDE 10-12.5 MG PO TABS: ORAL_TABLET | ORAL | 5 refills | Status: DC

## 2019-01-15 MED ORDER — ESOMEPRAZOLE MAGNESIUM 40 MG PO CPDR: 40.0000 mg | DELAYED_RELEASE_CAPSULE | Freq: Every day | ORAL | 5 refills | Status: DC

## 2019-01-15 NOTE — Progress Notes (Signed)
   Subjective:    Patient ID: Brian Brewer, male    DOB: 04-14-1965, 53 y.o.   MRN: [id number]  Hypertension This is a chronic problem. The current episode started more than 1 year ago. Risk factors for coronary artery disease include dyslipidemia and male gender. Treatments tried: zestoretic. There are no compliance problems.    Patient does have severe psoriatic arthritis had back surgery earlier this year is going to have potential hip surgery later this year or next year  Patient does try to eat healthy for the most part take his medications on a regular basis Patient relates compliance with pravastatin watching diet as well Compliance with blood pressure medicine minimizing salt Uses allopurinol to keep uric acid and control.   Review of Systems Virtual Visit via Video Note  I connected with Brian Brewer on 01/15/19 at  8:30 AM EST by a video enabled telemedicine application and verified that I am speaking with the correct person using two identifiers.  Location: Patient: home Provider: office   I discussed the limitations of evaluation and management by telemedicine and the availability of in person appointments. The patient expressed understanding and agreed to proceed.  History of Present Illness:    Observations/Objective:   Assessment and Plan:   Follow Up Instructions:    I discussed the assessment and treatment plan with the patient. The patient was provided an opportunity to ask questions and all were answered. The patient agreed with the plan and demonstrated an understanding of the instructions.   The patient was advised to call back or seek an in-person evaluation if the symptoms worsen or if the condition fails to improve as anticipated.  I provided 17 minutes of non-face-to-face time during this encounter.        Objective:   Physical Exam  Patient had virtual visit Appears to be in no distress Atraumatic Neuro able to relate and oriented No  apparent resp distress Color normal       Assessment & Plan:  Lab work ordered patient encouraged to get this completed within the next 60 days  Hypertension good control currently continue current medications.  Follow-up if progressive troubles  Hyperlipidemia previous labs reviewed new labs ordered continue medication  Psoriatic arthritis under the care of specialist possible surgery coming up  Covid protection was discussed in detail

## 2019-01-16 ENCOUNTER — Other Ambulatory Visit: Payer: Self-pay

## 2019-01-16 ENCOUNTER — Ambulatory Visit: Payer: Commercial Managed Care - PPO | Admitting: Orthopaedic Surgery

## 2019-01-16 ENCOUNTER — Ambulatory Visit: Payer: Self-pay

## 2019-01-16 ENCOUNTER — Encounter: Payer: Self-pay | Admitting: Orthopaedic Surgery

## 2019-01-16 DIAGNOSIS — M1611 Unilateral primary osteoarthritis, right hip: Secondary | ICD-10-CM | POA: Insufficient documentation

## 2019-01-16 DIAGNOSIS — M25551 Pain in right hip: Secondary | ICD-10-CM

## 2019-01-16 NOTE — Progress Notes (Signed)
Office Visit Note   Patient: Brian Brewer           Date of Birth: 1965/06/02           MRN: [id number] Visit Date: 01/16/2019              Requested by: Kathyrn Drown, MD [place],  [place] [address] PCP: Kathyrn Drown, MD   Assessment & Plan: Visit Diagnoses:  1. Pain in right hip   2. Unilateral primary osteoarthritis, right hip     Plan: Given the severity of his hip arthritis, his hip pain and his clinical exam findings, I am recommending a total hip arthroplasty through a direct anterior approach.  I explained in detail what the surgery involves.  We talked about his interoperative and postoperative course.  I gave him a brochure about hip replacement surgery.  I went over his x-rays with him in detail.  We talked about the risk and benefits of surgery in great detail.  All question concerns were answered and addressed.  Given the very conservative treatment for over a year I do feel it is warranted.  We will work on getting this scheduled in the near future.  Follow-Up Instructions: Return for 2 weeks post-op.   Orders:  Orders Placed This Encounter  Procedures  . XR HIP UNILAT W OR W/O PELVIS 1V RIGHT   No orders of the defined types were placed in this encounter.     Procedures: No procedures performed   Clinical Data: No additional findings.   Subjective: Chief Complaint  Patient presents with  . Right Hip - Pain  The patient comes in today for evaluation treatment of right hip pain that has been hurting for well over a year now.  He has had history of significant lumbar spine issues and has had at least 2 or more spine surgeries.  He is sent from Dr. Rennis Harding with spine and scoliosis specialist to evaluate a significantly arthritic right hip.  He did have a recent steroid injection of the hip joint he said that helped significantly but he still has significant pain and stiffness within the joint itself and it is definitely affecting  his mobility, his quality of life and his activities daily living.  He said his hip pain is daily and it is 10 out of 10 in terms of the pain he is having.  He says the injection definitely helped for short amount of time.  He denies any injury to that hip and he says is been slowly getting worse for several years but now is been more rapidly worse in terms of the pain he is experiencing.  He is a smoker but is not a diabetic.  HPI  Review of Systems He currently denies any headache, chest pain, shortness of breath, fever, chills, nausea, vomiting  Objective: Vital Signs: There were no vitals taken for this visit.  Physical Exam He is alert and orient x3 and in no acute distress Ortho Exam Examination of his right hip shows significant stiffness with internal and external rotation and severe pain with internal and external rotation.  His left hip exam is entirely normal and he moves smoothly with no issues at all. Specialty Comments:  No specialty comments available.  Imaging: XR HIP UNILAT W OR W/O PELVIS 1V RIGHT  Result Date: 01/16/2019 An AP pelvis and lateral right hip shows severe end-stage arthritis of the right hip.  There are large periarticular  osteophytes around the hip.  There is significant narrowing of the superior lateral aspect of the head as well as flattening of the femoral head.  There is also sclerotic changes.    PMFS History: Patient Active Problem List   Diagnosis Date Noted  . Unilateral primary osteoarthritis, right hip 01/16/2019  . Chest pain 10/27/2017  . Bronchitis due to tobacco use 10/27/2017  . Psoriatic arthritis ([place]) 12/11/2013  . Hyperuricemia 12/11/2013  . HTN (hypertension), benign 10/22/2012  . Hyperglycemia 10/22/2012  . Hyperlipemia 10/22/2012  . Allergic rhinitis 05/21/2012  . GERD 05/27/2009  . ABDOMINAL PAIN, RIGHT UPPER QUADRANT 05/27/2009   Past Medical History:  Diagnosis Date  . GERD (gastroesophageal reflux disease)   . Gout    . Hyperlipemia   . Hypertension   . Psoriatic arthritis ([place]) 12/11/2013   Dr Amil Amen     Family History  Problem Relation Age of Onset  . Heart disease Father   . Hypertension Father   . Leukemia Father   . Anesthesia problems Neg Hx   . Hypotension Neg Hx   . Malignant hyperthermia Neg Hx   . Pseudochol deficiency Neg Hx     Past Surgical History:  Procedure Laterality Date  . BACK SURGERY  2003   L4,5,S1 Fusion  . CHOLECYSTECTOMY    . COLONOSCOPY N/A 12/29/2016   Procedure: COLONOSCOPY;  Surgeon: Daneil Dolin, MD;  Location: [facility];  Service: Endoscopy;  Laterality: N/A;  9:15 AM  . LUMBAR FUSION     3 yrs ago-[place]   Social History   Occupational History  . Not on file  Tobacco Use  . Smoking status: Current Every Day Smoker    Packs/day: 0.50    Years: 20.00    Pack years: 10.00    Types: Cigarettes  . Smokeless tobacco: Never Used  Substance and Sexual Activity  . Alcohol use: No  . Drug use: No  . Sexual activity: Yes    Birth control/protection: None

## 2019-02-05 ENCOUNTER — Other Ambulatory Visit: Payer: Self-pay | Admitting: Physician Assistant

## 2019-02-06 ENCOUNTER — Other Ambulatory Visit: Payer: Self-pay

## 2019-02-07 ENCOUNTER — Encounter: Payer: Self-pay | Admitting: Family Medicine

## 2019-02-07 LAB — BASIC METABOLIC PANEL
BUN/Creatinine Ratio: 9 (ref 9–20)
BUN: 10 mg/dL (ref 6–24)
CO2: 25 mmol/L (ref 20–29)
Calcium: 9.9 mg/dL (ref 8.7–10.2)
Chloride: 99 mmol/L (ref 96–106)
Creatinine, Ser: 1.08 mg/dL (ref 0.76–1.27)
GFR calc Af Amer: 91 mL/min/{1.73_m2} (ref >59)
GFR calc non Af Amer: 78 mL/min/{1.73_m2} (ref >59)
Glucose: 116 mg/dL — ABNORMAL HIGH (ref 65–99)
Potassium: 4.2 mmol/L (ref 3.5–5.2)
Sodium: 136 mmol/L (ref 134–144)

## 2019-02-07 LAB — HEPATIC FUNCTION PANEL
ALT: 29 IU/L (ref 0–44)
AST: 23 IU/L (ref 0–40)
Albumin: 4.3 g/dL (ref 3.8–4.9)
Alkaline Phosphatase: 90 IU/L (ref 39–117)
Bilirubin Total: 0.2 mg/dL (ref 0.0–1.2)
Bilirubin, Direct: 0.07 mg/dL (ref 0.00–0.40)
Total Protein: 6.9 g/dL (ref 6.0–8.5)

## 2019-02-07 LAB — LIPID PANEL
Chol/HDL Ratio: 4.5 ratio (ref 0.0–5.0)
Cholesterol, Total: 144 mg/dL (ref 100–199)
HDL: 32 mg/dL — ABNORMAL LOW (ref >39)
LDL Chol Calc (NIH): 87 mg/dL (ref 0–99)
Triglycerides: 137 mg/dL (ref 0–149)
VLDL Cholesterol Cal: 25 mg/dL (ref 5–40)

## 2019-02-07 LAB — URIC ACID: Uric Acid: 4.2 mg/dL (ref 3.8–8.4)

## 2019-02-07 LAB — PSA: Prostate Specific Ag, Serum: 0.4 ng/mL (ref 0.0–4.0)

## 2019-02-11 NOTE — Patient Instructions (Addendum)
DUE TO COVID-19 ONLY ONE VISITOR IS ALLOWED TO COME WITH YOU AND STAY IN THE WAITING ROOM ONLY DURING PRE OP AND PROCEDURE DAY OF SURGERY. THE 1 VISITOR MAY VISIT WITH YOU AFTER SURGERY IN YOUR PRIVATE ROOM DURING VISITING HOURS ONLY!  YOU NEED TO HAVE A COVID 19 TEST ON: 02/12/2019 @  3:10 pm , THIS TEST MUST BE DONE BEFORE SURGERY, COME  [place], [place] , [address].  ([place]) ONCE YOUR COVID TEST IS COMPLETED, PLEASE BEGIN THE QUARANTINE INSTRUCTIONS AS OUTLINED IN YOUR HANDOUT.                Brian Brewer      Your procedure is scheduled on:    Report to [facility]  Entrance    Report to short stay at: 5:30 AM     Call this number if you have problems the morning of surgery [phone]     Remember:      [place], NO [place].     Take these medicines the morning of surgery with A SIP OF WATER:  Allopurinol,esomeprazole,norco as needed.                                 You may not have any metal on your body including hair pins and              piercings  Do not wear jewelry, powders or perfumes, deodorant             Men may shave face and neck.      Do not bring valuables to the hospital. [place].  Contacts, dentures or bridgework may not be worn into surgery.  Leave suitcase in the car. After surgery it may be brought to your room.      Patients discharged the day of surgery will not be allowed to drive home. IF YOU ARE HAVING SURGERY AND GOING HOME THE SAME DAY, YOU MUST HAVE AN ADULT TO DRIVE YOU HOME AND BE WITH YOU FOR 24 HOURS. YOU MAY GO HOME BY TAXI OR UBER OR ORTHERWISE, BUT AN ADULT MUST ACCOMPANY YOU HOME AND STAY WITH YOU FOR 24 HOURS.  Name and phone number of your driver:  Special Instructions: N/A             : _____________________________________________________________________               NO SOLID FOOD AFTER MIDNIGHT THE NIGHT PRIOR TO SURGERY. NOTHING BY MOUTH EXCEPT CLEAR LIQUIDS UNTIL: 4:15 am . PLEASE FINISH ENSURE DRINK PER SURGEON ORDER  WHICH NEEDS TO BE COMPLETED AT: 4:15 am .   CLEAR LIQUID DIET   Foods Allowed                                                                     Foods Excluded  Coffee and tea, regular and decaf  liquids that you cannot  Plain Jell-O any favor except red or purple                                           see through such as: Fruit ices (not with fruit pulp)                                     milk, soups, orange juice  Iced Popsicles                                    All solid food Carbonated beverages, regular and diet                                    Cranberry, grape and apple juices Sports drinks like Gatorade Lightly seasoned clear broth or consume(fat free) Sugar, honey syrup  Sample Menu Breakfast                                Lunch                                     Supper Cranberry juice                    Beef broth                            Chicken broth Jell-O                                     Grape juice                           Apple juice Coffee or tea                        Jell-O                                      Popsicle                                                Coffee or tea                        Coffee or tea  _____________________________________________________________________  [facility] - Preparing for Surgery Before surgery, you can play an important role.  Because skin is not sterile, your skin needs to be as free of germs as possible.  You can reduce the number of germs on your skin by washing with CHG (chlorahexidine gluconate) soap before surgery.  CHG is an antiseptic cleaner which kills  germs and bonds with the skin to continue killing germs even after  washing. Please DO NOT use if you have an allergy to CHG or antibacterial soaps.  If your skin becomes reddened/irritated stop using the CHG and inform your nurse when you arrive at Short Stay. Do not shave (including legs and underarms) for at least 48 hours prior to the first CHG shower.  You may shave your face/neck. Please follow these instructions carefully:  1.  Shower with CHG Soap the night before surgery and the  morning of Surgery.  2.  If you choose to wash your hair, wash your hair first as usual with your  normal  shampoo.  3.  After you shampoo, rinse your hair and body thoroughly to remove the  shampoo.                           4.  Use CHG as you would any other liquid soap.  You can apply chg directly  to the skin and wash                       Gently with a scrungie or clean washcloth.  5.  Apply the CHG Soap to your body ONLY FROM THE NECK DOWN.   Do not use on face/ open                           Wound or open sores. Avoid contact with eyes, ears mouth and genitals (private parts).                       Wash face,  Genitals (private parts) with your normal soap.             6.  Wash thoroughly, paying special attention to the area where your surgery  will be performed.  7.  Thoroughly rinse your body with warm water from the neck down.  8.  DO NOT shower/wash with your normal soap after using and rinsing off  the CHG Soap.                9.  Pat yourself dry with a clean towel.            10.  Wear clean pajamas.            11.  Place clean sheets on your bed the night of your first shower and do not  sleep with pets. Day of Surgery : Do not apply any lotions/deodorants the morning of surgery.  Please wear clean clothes to the hospital/surgery center.  FAILURE TO FOLLOW THESE INSTRUCTIONS MAY RESULT IN THE CANCELLATION OF YOUR SURGERY PATIENT SIGNATURE_________________________________  NURSE  SIGNATURE__________________________________  ________________________________________________________________________   Brian Brewer  An incentive spirometer is a tool that can help keep your lungs clear and active. This tool measures how well you are filling your lungs with each breath. Taking long deep breaths may help reverse or decrease the chance of developing breathing (pulmonary) problems (especially infection) following:  A long period of time when you are unable to move or be active. BEFORE THE PROCEDURE   If the spirometer includes an indicator to show your best effort, your nurse or respiratory therapist will set it to a desired goal.  If possible, sit up straight or lean slightly forward. Try not to slouch.  Hold the incentive spirometer in an  upright position. INSTRUCTIONS FOR USE  1. Sit on the edge of your bed if possible, or sit up as far as you can in bed or on a chair. 2. Hold the incentive spirometer in an upright position. 3. Breathe out normally. 4. Place the mouthpiece in your mouth and seal your lips tightly around it. 5. Breathe in slowly and as deeply as possible, raising the piston or the ball toward the top of the column. 6. Hold your breath for 3-5 seconds or for as long as possible. Allow the piston or ball to fall to the bottom of the column. 7. Remove the mouthpiece from your mouth and breathe out normally. 8. Rest for a few seconds and repeat Steps 1 through 7 at least 10 times every 1-2 hours when you are awake. Take your time and take a few normal breaths between deep breaths. 9. The spirometer may include an indicator to show your best effort. Use the indicator as a goal to work toward during each repetition. 10. After each set of 10 deep breaths, practice coughing to be sure your lungs are clear. If you have an incision (the cut made at the time of surgery), support your incision when coughing by placing a pillow or rolled up towels firmly  against it. Once you are able to get out of bed, walk around indoors and cough well. You may stop using the incentive spirometer when instructed by your caregiver.  RISKS AND COMPLICATIONS  Take your time so you do not get dizzy or light-headed.  If you are in pain, you may need to take or ask for pain medication before doing incentive spirometry. It is harder to take a deep breath if you are having pain. AFTER USE  Rest and breathe slowly and easily.  It can be helpful to keep track of a log of your progress. Your caregiver can provide you with a simple table to help with this. If you are using the spirometer at home, follow these instructions: [place] IF:   You are having difficultly using the spirometer.  You have trouble using the spirometer as often as instructed.  Your pain medication is not giving enough relief while using the spirometer.  You develop fever of 100.5 F (38.1 C) or higher. SEEK IMMEDIATE MEDICAL CARE IF:   You cough up bloody sputum that had not been present before.  You develop fever of 102 F (38.9 C) or greater.  You develop worsening pain at or near the incision site. MAKE SURE YOU:   Understand these instructions.  Will watch your condition.  Will get help right away if you are not doing well or get worse. Document Released: 05/30/2006 Document Revised: 04/11/2011 Document Reviewed: 07/31/2006 [facility] Patient Information 2014 [place], [place].   ________________________________________________________________________

## 2019-02-12 ENCOUNTER — Encounter (HOSPITAL_COMMUNITY)
Admission: RE | Admit: 2019-02-12 | Discharge: 2019-02-12 | Disposition: A | Discharge status: Home / Self Care | Payer: Commercial Managed Care - PPO | Source: Ambulatory Visit | Attending: Orthopaedic Surgery | Admitting: Orthopaedic Surgery

## 2019-02-12 ENCOUNTER — Encounter (HOSPITAL_COMMUNITY): Payer: Self-pay

## 2019-02-12 ENCOUNTER — Other Ambulatory Visit (HOSPITAL_COMMUNITY)
Admission: RE | Admit: 2019-02-12 | Discharge: 2019-02-12 | Disposition: A | Discharge status: Home / Self Care | Payer: Commercial Managed Care - PPO | Source: Ambulatory Visit | Attending: Orthopaedic Surgery | Admitting: Orthopaedic Surgery

## 2019-02-12 ENCOUNTER — Other Ambulatory Visit: Payer: Self-pay

## 2019-02-12 DIAGNOSIS — Z01818 Encounter for other preprocedural examination: Secondary | ICD-10-CM | POA: Insufficient documentation

## 2019-02-12 DIAGNOSIS — R9431 Abnormal electrocardiogram [ECG] [EKG]: Secondary | ICD-10-CM | POA: Diagnosis not present

## 2019-02-12 DIAGNOSIS — Z20822 Contact with and (suspected) exposure to covid-19: Secondary | ICD-10-CM | POA: Insufficient documentation

## 2019-02-12 DIAGNOSIS — Z01812 Encounter for preprocedural laboratory examination: Secondary | ICD-10-CM | POA: Insufficient documentation

## 2019-02-12 HISTORY — DX: Malignant (primary) neoplasm, unspecified: C80.1

## 2019-02-12 LAB — CBC
HCT: 47 % (ref 39.0–52.0)
Hemoglobin: 15.6 g/dL (ref 13.0–17.0)
MCH: 30.4 pg (ref 26.0–34.0)
MCHC: 33.2 g/dL (ref 30.0–36.0)
MCV: 91.4 fL (ref 80.0–100.0)
Platelets: 230 10*3/uL (ref 150–400)
RBC: 5.14 MIL/uL (ref 4.22–5.81)
RDW: 13.4 % (ref 11.5–15.5)
WBC: 11.5 10*3/uL — ABNORMAL HIGH (ref 4.0–10.5)
nRBC: 0 % (ref 0.0–0.2)

## 2019-02-12 LAB — SURGICAL PCR SCREEN
MRSA, PCR: NEGATIVE
Staphylococcus aureus: NEGATIVE

## 2019-02-12 NOTE — Progress Notes (Signed)
PCP - Sallee Lange. LOV 12/15. EPIC Cardiologist -   Chest x-ray -  EKG -  Stress Test -  ECHO -  Cardiac Cath -   Sleep Study -  CPAP -   Fasting Blood Sugar -  Checks Blood Sugar _____ times a day  Blood Thinner Instructions: Aspirin Instructions:Stoped on 1/10. Last Dose:1/10  Anesthesia review:   Patient denies shortness of breath, fever, cough and chest pain at PAT appointment   Patient verbalized understanding of instructions that were given to them at the PAT appointment. Patient was also instructed that they will need to review over the PAT instructions again at home before surgery.

## 2019-02-14 ENCOUNTER — Other Ambulatory Visit: Payer: Self-pay | Admitting: Orthopaedic Surgery

## 2019-02-14 ENCOUNTER — Encounter: Payer: Self-pay | Admitting: *Deleted

## 2019-02-14 LAB — NOVEL CORONAVIRUS, NAA (HOSP ORDER, SEND-OUT TO REF LAB; TAT 18-24 HRS): SARS-CoV-2, NAA: NOT DETECTED

## 2019-02-14 MED ORDER — HYDROMORPHONE HCL 2 MG PO TABS: 2.0000 mg | ORAL_TABLET | ORAL | 0 refills | Status: DC | PRN

## 2019-02-14 MED ORDER — ASPIRIN 81 MG PO CHEW: 81.0000 mg | CHEWABLE_TABLET | Freq: Two times a day (BID) | ORAL | 0 refills | Status: DC

## 2019-02-14 MED ORDER — METHOCARBAMOL 500 MG PO TABS: 500.0000 mg | ORAL_TABLET | Freq: Four times a day (QID) | ORAL | 1 refills | Status: DC | PRN

## 2019-02-14 MED ORDER — ONDANSETRON 4 MG PO TBDP: 4.0000 mg | ORAL_TABLET | Freq: Three times a day (TID) | ORAL | 0 refills | Status: DC | PRN

## 2019-02-14 NOTE — Anesthesia Preprocedure Evaluation (Addendum)
Anesthesia Evaluation  Patient identified by MRN, date of birth, ID band Patient awake    Reviewed: Allergy & Precautions, NPO status , Patient's Chart, lab work & pertinent test results  Airway Mallampati: III  TM Distance: >3 FB Neck ROM: Full    Dental no notable dental hx.    Pulmonary Current Smoker and Patient abstained from smoking.,    Pulmonary exam normal breath sounds clear to auscultation       Cardiovascular hypertension, Pt. on medications Normal cardiovascular exam Rhythm:Regular Rate:Normal  ECG: NSR, rate 80   Neuro/Psych negative neurological ROS  negative psych ROS   GI/Hepatic Neg liver ROS, GERD  Medicated and Controlled,  Endo/Other  negative endocrine ROS  Renal/GU negative Renal ROS     Musculoskeletal  (+) Arthritis , GOUT LUMBAR FUSION    BACK SURGERY        Abdominal   Peds  Hematology HLD   Anesthesia Other Findings osteoarthritis right hip  Reproductive/Obstetrics                           Anesthesia Physical Anesthesia Plan  ASA: II  Anesthesia Plan: General   Post-op Pain Management:    Induction: Intravenous  PONV Risk Score and Plan: 2 and Ondansetron, Dexamethasone, Midazolam, Treatment may vary due to age or medical condition and Propofol infusion  Airway Management Planned: Oral ETT  Additional Equipment:   Intra-op Plan:   Post-operative Plan: Extubation in OR  Informed Consent: I have reviewed the patients History and Physical, chart, labs and discussed the procedure including the risks, benefits and alternatives for the proposed anesthesia with the patient or authorized representative who has indicated his/her understanding and acceptance.     Dental advisory given  Plan Discussed with: CRNA  Anesthesia Plan Comments:         Anesthesia Quick Evaluation

## 2019-02-15 ENCOUNTER — Ambulatory Visit (HOSPITAL_COMMUNITY)
Admission: RE | Admit: 2019-02-15 | Discharge: 2019-02-15 | Disposition: A | Discharge status: Home / Self Care | Payer: Commercial Managed Care - PPO | Source: Ambulatory Visit | Attending: Orthopaedic Surgery | Admitting: Orthopaedic Surgery

## 2019-02-15 ENCOUNTER — Ambulatory Visit (HOSPITAL_COMMUNITY): Payer: Commercial Managed Care - PPO | Admitting: Physician Assistant

## 2019-02-15 ENCOUNTER — Ambulatory Visit (HOSPITAL_COMMUNITY): Payer: Commercial Managed Care - PPO

## 2019-02-15 ENCOUNTER — Encounter (HOSPITAL_COMMUNITY): Payer: Self-pay | Admitting: Orthopaedic Surgery

## 2019-02-15 ENCOUNTER — Encounter (HOSPITAL_COMMUNITY)
Admission: RE | Disposition: A | Discharge status: Home / Self Care | Payer: Self-pay | Source: Ambulatory Visit | Attending: Orthopaedic Surgery

## 2019-02-15 DIAGNOSIS — Z79899 Other long term (current) drug therapy: Secondary | ICD-10-CM | POA: Insufficient documentation

## 2019-02-15 DIAGNOSIS — M1990 Unspecified osteoarthritis, unspecified site: Secondary | ICD-10-CM | POA: Insufficient documentation

## 2019-02-15 DIAGNOSIS — M109 Gout, unspecified: Secondary | ICD-10-CM | POA: Insufficient documentation

## 2019-02-15 DIAGNOSIS — Z7982 Long term (current) use of aspirin: Secondary | ICD-10-CM | POA: Insufficient documentation

## 2019-02-15 DIAGNOSIS — I10 Essential (primary) hypertension: Secondary | ICD-10-CM | POA: Diagnosis not present

## 2019-02-15 DIAGNOSIS — Z981 Arthrodesis status: Secondary | ICD-10-CM | POA: Diagnosis not present

## 2019-02-15 DIAGNOSIS — E785 Hyperlipidemia, unspecified: Secondary | ICD-10-CM | POA: Insufficient documentation

## 2019-02-15 DIAGNOSIS — F17210 Nicotine dependence, cigarettes, uncomplicated: Secondary | ICD-10-CM | POA: Diagnosis not present

## 2019-02-15 DIAGNOSIS — Z85820 Personal history of malignant melanoma of skin: Secondary | ICD-10-CM | POA: Insufficient documentation

## 2019-02-15 DIAGNOSIS — Z8249 Family history of ischemic heart disease and other diseases of the circulatory system: Secondary | ICD-10-CM | POA: Diagnosis not present

## 2019-02-15 DIAGNOSIS — Z96641 Presence of right artificial hip joint: Secondary | ICD-10-CM

## 2019-02-15 DIAGNOSIS — L4050 Arthropathic psoriasis, unspecified: Secondary | ICD-10-CM | POA: Diagnosis not present

## 2019-02-15 DIAGNOSIS — K219 Gastro-esophageal reflux disease without esophagitis: Secondary | ICD-10-CM | POA: Insufficient documentation

## 2019-02-15 DIAGNOSIS — M1611 Unilateral primary osteoarthritis, right hip: Secondary | ICD-10-CM | POA: Diagnosis present

## 2019-02-15 DIAGNOSIS — Z419 Encounter for procedure for purposes other than remedying health state, unspecified: Secondary | ICD-10-CM

## 2019-02-15 HISTORY — PX: TOTAL HIP ARTHROPLASTY: SHX124

## 2019-02-15 SURGERY — ARTHROPLASTY, HIP, TOTAL, ANTERIOR APPROACH
Anesthesia: General | Site: Hip | Laterality: Right

## 2019-02-15 MED ORDER — ONDANSETRON HCL 4 MG/2ML IJ SOLN
INTRAMUSCULAR | Status: AC
  Filled 2019-02-15: qty 2

## 2019-02-15 MED ORDER — CHLORHEXIDINE GLUCONATE 4 % EX LIQD: 60.0000 mL | Freq: Once | CUTANEOUS | Status: DC

## 2019-02-15 MED ORDER — PROMETHAZINE HCL 25 MG/ML IJ SOLN: 6.2500 mg | INTRAMUSCULAR | Status: DC | PRN

## 2019-02-15 MED ORDER — PRAVASTATIN SODIUM 40 MG PO TABS
40.0000 mg | ORAL_TABLET | Freq: Every evening | ORAL | Status: DC
  Filled 2019-02-15: qty 1

## 2019-02-15 MED ORDER — MIDAZOLAM HCL 5 MG/5ML IJ SOLN
INTRAMUSCULAR | Status: DC | PRN
  Administered 2019-02-15: 2 mg via INTRAVENOUS

## 2019-02-15 MED ORDER — LACTATED RINGERS IV BOLUS: 250.0000 mL | Freq: Once | INTRAVENOUS | Status: DC

## 2019-02-15 MED ORDER — PROPOFOL 10 MG/ML IV BOLUS
INTRAVENOUS | Status: AC
  Filled 2019-02-15: qty 20

## 2019-02-15 MED ORDER — GLYCOPYRROLATE PF 0.2 MG/ML IJ SOSY
PREFILLED_SYRINGE | INTRAMUSCULAR | Status: AC
  Filled 2019-02-15: qty 1

## 2019-02-15 MED ORDER — LISINOPRIL 10 MG PO TABS
10.0000 mg | ORAL_TABLET | Freq: Every day | ORAL | Status: DC
  Administered 2019-02-15: 10 mg via ORAL
  Filled 2019-02-15: qty 1

## 2019-02-15 MED ORDER — CLINDAMYCIN PHOSPHATE 900 MG/50ML IV SOLN
900.0000 mg | INTRAVENOUS | Status: AC
  Administered 2019-02-15: 900 mg via INTRAVENOUS
  Filled 2019-02-15: qty 50

## 2019-02-15 MED ORDER — SUCCINYLCHOLINE CHLORIDE 200 MG/10ML IV SOSY
PREFILLED_SYRINGE | INTRAVENOUS | Status: AC
  Filled 2019-02-15: qty 10

## 2019-02-15 MED ORDER — KETOROLAC TROMETHAMINE 15 MG/ML IJ SOLN
7.5000 mg | Freq: Four times a day (QID) | INTRAMUSCULAR | Status: DC
  Administered 2019-02-15: 7.5 mg via INTRAVENOUS

## 2019-02-15 MED ORDER — METHOCARBAMOL 500 MG PO TABS: 500.0000 mg | ORAL_TABLET | Freq: Four times a day (QID) | ORAL | Status: DC | PRN

## 2019-02-15 MED ORDER — ONDANSETRON HCL 4 MG/2ML IJ SOLN
INTRAMUSCULAR | Status: DC | PRN
  Administered 2019-02-15: 4 mg via INTRAVENOUS

## 2019-02-15 MED ORDER — PROPOFOL 500 MG/50ML IV EMUL
INTRAVENOUS | Status: DC | PRN
  Administered 2019-02-15: 50 ug/kg/min via INTRAVENOUS

## 2019-02-15 MED ORDER — ACETAMINOPHEN 325 MG PO TABS: 325.0000 mg | ORAL_TABLET | Freq: Four times a day (QID) | ORAL | Status: DC | PRN

## 2019-02-15 MED ORDER — GLYCOPYRROLATE PF 0.2 MG/ML IJ SOSY
PREFILLED_SYRINGE | INTRAMUSCULAR | Status: DC | PRN
  Administered 2019-02-15: .2 mg via INTRAVENOUS

## 2019-02-15 MED ORDER — PROPOFOL 10 MG/ML IV BOLUS
INTRAVENOUS | Status: DC | PRN
  Administered 2019-02-15: 200 mg via INTRAVENOUS

## 2019-02-15 MED ORDER — ROCURONIUM BROMIDE 50 MG/5ML IV SOSY
PREFILLED_SYRINGE | INTRAVENOUS | Status: DC | PRN
  Administered 2019-02-15: 50 mg via INTRAVENOUS

## 2019-02-15 MED ORDER — FENTANYL CITRATE (PF) 100 MCG/2ML IJ SOLN
INTRAMUSCULAR | Status: AC
  Filled 2019-02-15: qty 2

## 2019-02-15 MED ORDER — NIACIN 500 MG PO TABS
500.0000 mg | ORAL_TABLET | Freq: Every day | ORAL | Status: DC
  Administered 2019-02-15: 500 mg via ORAL
  Filled 2019-02-15: qty 1

## 2019-02-15 MED ORDER — ALLOPURINOL 300 MG PO TABS
300.0000 mg | ORAL_TABLET | Freq: Every day | ORAL | Status: DC
  Filled 2019-02-15: qty 1

## 2019-02-15 MED ORDER — HYDROMORPHONE HCL 1 MG/ML IJ SOLN: 0.5000 mg | INTRAMUSCULAR | Status: DC | PRN

## 2019-02-15 MED ORDER — ACETAMINOPHEN 500 MG PO TABS
1000.0000 mg | ORAL_TABLET | Freq: Once | ORAL | Status: AC
  Administered 2019-02-15: 1000 mg via ORAL
  Filled 2019-02-15: qty 2

## 2019-02-15 MED ORDER — FENTANYL CITRATE (PF) 100 MCG/2ML IJ SOLN
INTRAMUSCULAR | Status: DC | PRN
  Administered 2019-02-15 (×6): 50 ug via INTRAVENOUS

## 2019-02-15 MED ORDER — METHOCARBAMOL 500 MG IVPB - SIMPLE MED
INTRAVENOUS | Status: AC
  Filled 2019-02-15: qty 50

## 2019-02-15 MED ORDER — HYDROMORPHONE HCL 1 MG/ML IJ SOLN
INTRAMUSCULAR | Status: AC
  Filled 2019-02-15: qty 1

## 2019-02-15 MED ORDER — POVIDONE-IODINE 10 % EX SWAB
2.0000 "application " | Freq: Once | CUTANEOUS | Status: AC
  Administered 2019-02-15: 2 via TOPICAL

## 2019-02-15 MED ORDER — LIDOCAINE 2% (20 MG/ML) 5 ML SYRINGE
INTRAMUSCULAR | Status: DC | PRN
  Administered 2019-02-15: 80 mg via INTRAVENOUS

## 2019-02-15 MED ORDER — KETOROLAC TROMETHAMINE 15 MG/ML IJ SOLN
INTRAMUSCULAR | Status: AC
  Filled 2019-02-15: qty 1

## 2019-02-15 MED ORDER — DOCUSATE SODIUM 100 MG PO CAPS
100.0000 mg | ORAL_CAPSULE | Freq: Two times a day (BID) | ORAL | Status: DC
  Filled 2019-02-15: qty 1

## 2019-02-15 MED ORDER — ROCURONIUM BROMIDE 10 MG/ML (PF) SYRINGE
PREFILLED_SYRINGE | INTRAVENOUS | Status: AC
  Filled 2019-02-15: qty 10

## 2019-02-15 MED ORDER — MIDAZOLAM HCL 2 MG/2ML IJ SOLN
INTRAMUSCULAR | Status: AC
  Filled 2019-02-15: qty 2

## 2019-02-15 MED ORDER — ONDANSETRON HCL 4 MG PO TABS
4.0000 mg | ORAL_TABLET | Freq: Four times a day (QID) | ORAL | Status: DC | PRN
  Filled 2019-02-15: qty 1

## 2019-02-15 MED ORDER — DIPHENHYDRAMINE HCL 12.5 MG/5ML PO ELIX
12.5000 mg | ORAL_SOLUTION | ORAL | Status: DC | PRN
  Filled 2019-02-15: qty 10

## 2019-02-15 MED ORDER — SODIUM CHLORIDE 0.9 % IV SOLN: INTRAVENOUS | Status: DC

## 2019-02-15 MED ORDER — METOCLOPRAMIDE HCL 5 MG PO TABS
5.0000 mg | ORAL_TABLET | Freq: Three times a day (TID) | ORAL | Status: DC | PRN
  Filled 2019-02-15: qty 2

## 2019-02-15 MED ORDER — SODIUM CHLORIDE 0.9 % IR SOLN
Status: DC | PRN
  Administered 2019-02-15: 1000 mL

## 2019-02-15 MED ORDER — METOCLOPRAMIDE HCL 5 MG/ML IJ SOLN: 5.0000 mg | Freq: Three times a day (TID) | INTRAMUSCULAR | Status: DC | PRN

## 2019-02-15 MED ORDER — KETOROLAC TROMETHAMINE 30 MG/ML IJ SOLN
30.0000 mg | Freq: Once | INTRAMUSCULAR | Status: AC | PRN
  Administered 2019-02-15: 15 mg via INTRAVENOUS

## 2019-02-15 MED ORDER — PANTOPRAZOLE SODIUM 40 MG PO TBEC
40.0000 mg | DELAYED_RELEASE_TABLET | Freq: Every day | ORAL | Status: DC
  Administered 2019-02-15: 40 mg via ORAL
  Filled 2019-02-15: qty 1

## 2019-02-15 MED ORDER — HYDROMORPHONE HCL 1 MG/ML IJ SOLN
0.2500 mg | INTRAMUSCULAR | Status: DC | PRN
  Administered 2019-02-15 (×2): 0.5 mg via INTRAVENOUS

## 2019-02-15 MED ORDER — METHOCARBAMOL 500 MG IVPB - SIMPLE MED
500.0000 mg | Freq: Four times a day (QID) | INTRAVENOUS | Status: DC | PRN
  Administered 2019-02-15: 500 mg via INTRAVENOUS

## 2019-02-15 MED ORDER — PANTOPRAZOLE SODIUM 40 MG PO PACK: 40.0000 mg | PACK | Freq: Every day | ORAL | Status: DC

## 2019-02-15 MED ORDER — EPHEDRINE 5 MG/ML INJ
INTRAVENOUS | Status: AC
  Filled 2019-02-15: qty 10

## 2019-02-15 MED ORDER — HYDROMORPHONE HCL 2 MG PO TABS
ORAL_TABLET | ORAL | Status: AC
  Filled 2019-02-15: qty 1

## 2019-02-15 MED ORDER — KETOROLAC TROMETHAMINE 30 MG/ML IJ SOLN
INTRAMUSCULAR | Status: AC
  Filled 2019-02-15: qty 1

## 2019-02-15 MED ORDER — HYDROMORPHONE HCL 2 MG PO TABS
2.0000 mg | ORAL_TABLET | ORAL | Status: DC | PRN
  Administered 2019-02-15: 2 mg via ORAL

## 2019-02-15 MED ORDER — LACTATED RINGERS IV BOLUS
500.0000 mL | Freq: Once | INTRAVENOUS | Status: AC
  Administered 2019-02-15: 500 mL via INTRAVENOUS

## 2019-02-15 MED ORDER — SUGAMMADEX SODIUM 200 MG/2ML IV SOLN
INTRAVENOUS | Status: DC | PRN
  Administered 2019-02-15: 200 mg via INTRAVENOUS

## 2019-02-15 MED ORDER — ASPIRIN 81 MG PO CHEW: 81.0000 mg | CHEWABLE_TABLET | Freq: Two times a day (BID) | ORAL | Status: DC

## 2019-02-15 MED ORDER — TRANEXAMIC ACID-NACL 1000-0.7 MG/100ML-% IV SOLN
1000.0000 mg | INTRAVENOUS | Status: AC
  Administered 2019-02-15: 1000 mg via INTRAVENOUS
  Filled 2019-02-15: qty 100

## 2019-02-15 MED ORDER — ASPIRIN 81 MG PO CHEW
81.0000 mg | CHEWABLE_TABLET | Freq: Two times a day (BID) | ORAL | Status: DC
  Filled 2019-02-15: qty 1

## 2019-02-15 MED ORDER — LIDOCAINE 2% (20 MG/ML) 5 ML SYRINGE
INTRAMUSCULAR | Status: AC
  Filled 2019-02-15: qty 5

## 2019-02-15 MED ORDER — LACTATED RINGERS IV SOLN: INTRAVENOUS | Status: DC

## 2019-02-15 MED ORDER — HYDROCHLOROTHIAZIDE 12.5 MG PO CAPS
12.5000 mg | ORAL_CAPSULE | Freq: Every day | ORAL | Status: DC
  Administered 2019-02-15: 12.5 mg via ORAL
  Filled 2019-02-15: qty 1

## 2019-02-15 MED ORDER — STERILE WATER FOR IRRIGATION IR SOLN
Status: DC | PRN
  Administered 2019-02-15: 2000 mL

## 2019-02-15 MED ORDER — CLINDAMYCIN PHOSPHATE 600 MG/50ML IV SOLN: 600.0000 mg | Freq: Four times a day (QID) | INTRAVENOUS | Status: DC

## 2019-02-15 MED ORDER — 0.9 % SODIUM CHLORIDE (POUR BTL) OPTIME
TOPICAL | Status: DC | PRN
  Administered 2019-02-15: 08:00:00 1000 mL

## 2019-02-15 MED ORDER — ONDANSETRON HCL 4 MG/2ML IJ SOLN: 4.0000 mg | Freq: Four times a day (QID) | INTRAMUSCULAR | Status: DC | PRN

## 2019-02-15 MED ORDER — ALUM & MAG HYDROXIDE-SIMETH 200-200-20 MG/5ML PO SUSP
30.0000 mL | ORAL | Status: DC | PRN
  Filled 2019-02-15: qty 30

## 2019-02-15 MED ORDER — LISINOPRIL-HYDROCHLOROTHIAZIDE 10-12.5 MG PO TABS
1.0000 | ORAL_TABLET | Freq: Every day | ORAL | Status: DC
Start: 2019-02-15 — End: 2019-02-15

## 2019-02-15 MED ORDER — PHENYLEPHRINE HCL (PRESSORS) 10 MG/ML IV SOLN
INTRAVENOUS | Status: AC
  Filled 2019-02-15: qty 1

## 2019-02-15 MED ORDER — PHENOL 1.4 % MT LIQD: 1.0000 | OROMUCOSAL | Status: DC | PRN

## 2019-02-15 MED ORDER — MENTHOL 3 MG MT LOZG: 1.0000 | LOZENGE | OROMUCOSAL | Status: DC | PRN

## 2019-02-15 SURGICAL SUPPLY — 48 items
ACETAB CUP W GRIPTION 54MM (Plate) ×1 IMPLANT
ACETAB CUP W/GRIPTION 54 (Plate) ×2 IMPLANT
APL SKNCLS STERI-STRIP NONHPOA (GAUZE/BANDAGES/DRESSINGS)
BAG SPEC THK2 15X12 ZIP CLS (MISCELLANEOUS)
BAG ZIPLOCK 12X15 (MISCELLANEOUS) IMPLANT
BENZOIN TINCTURE PRP APPL 2/3 (GAUZE/BANDAGES/DRESSINGS) IMPLANT
BLADE SAW SGTL 18X1.27X75 (BLADE) ×2 IMPLANT
BLADE SAW SGTL 18X1.27X75MM (BLADE) ×1
CLOSURE WOUND 1/2 X4 (GAUZE/BANDAGES/DRESSINGS)
COVER PERINEAL POST (MISCELLANEOUS) ×3 IMPLANT
COVER SURGICAL LIGHT HANDLE (MISCELLANEOUS) ×3 IMPLANT
COVER WAND RF STERILE (DRAPES) ×3 IMPLANT
CUP ACETAB W/GRIPTION 54 (Plate) IMPLANT
DRAPE STERI IOBAN 125X83 (DRAPES) ×3 IMPLANT
DRAPE U-SHAPE 47X51 STRL (DRAPES) ×6 IMPLANT
DRESSING AQUACEL AG SP 3.5X10 (GAUZE/BANDAGES/DRESSINGS) IMPLANT
DRSG AQUACEL AG ADV 3.5X10 (GAUZE/BANDAGES/DRESSINGS) ×3 IMPLANT
DRSG AQUACEL AG SP 3.5X10 (GAUZE/BANDAGES/DRESSINGS) ×3
DURAPREP 26ML APPLICATOR (WOUND CARE) ×3 IMPLANT
ELECT REM PT RETURN 15FT ADLT (MISCELLANEOUS) ×3 IMPLANT
FEM STEM 12/14 TAPER SZ 4 HIP (Orthopedic Implant) ×3 IMPLANT
FEMORAL STEM 12/14 TPR SZ4 HIP (Orthopedic Implant) IMPLANT
GAUZE XEROFORM 1X8 LF (GAUZE/BANDAGES/DRESSINGS) ×3 IMPLANT
GLOVE BIO SURGEON STRL SZ7.5 (GLOVE) ×3 IMPLANT
GLOVE BIOGEL PI IND STRL 8 (GLOVE) ×2 IMPLANT
GLOVE BIOGEL PI INDICATOR 8 (GLOVE) ×4
GLOVE ECLIPSE 8.0 STRL XLNG CF (GLOVE) ×3 IMPLANT
GOWN STRL REUS W/TWL XL LVL3 (GOWN DISPOSABLE) ×6 IMPLANT
HANDPIECE INTERPULSE COAX TIP (DISPOSABLE) ×3
HEAD CERAMIC 36 PLUS 8.5 12 14 (Hips) ×2 IMPLANT
HOLDER FOLEY CATH W/STRAP (MISCELLANEOUS) ×3 IMPLANT
KIT TURNOVER KIT A (KITS) IMPLANT
LINER NEUTRAL 36ID 54OD (Liner) ×2 IMPLANT
PACK ANTERIOR HIP CUSTOM (KITS) ×3 IMPLANT
PENCIL SMOKE EVACUATOR (MISCELLANEOUS) IMPLANT
SET HNDPC FAN SPRY TIP SCT (DISPOSABLE) ×1 IMPLANT
SLEEVE SUCTION 125 (MISCELLANEOUS) ×2 IMPLANT
STAPLER VISISTAT 35W (STAPLE) ×2 IMPLANT
STRIP CLOSURE SKIN 1/2X4 (GAUZE/BANDAGES/DRESSINGS) IMPLANT
SUT ETHIBOND NAB CT1 #1 30IN (SUTURE) ×3 IMPLANT
SUT ETHILON 2 0 PS N (SUTURE) IMPLANT
SUT MNCRL AB 4-0 PS2 18 (SUTURE) IMPLANT
SUT VIC AB 0 CT1 36 (SUTURE) ×3 IMPLANT
SUT VIC AB 1 CT1 36 (SUTURE) ×3 IMPLANT
SUT VIC AB 2-0 CT1 27 (SUTURE) ×6
SUT VIC AB 2-0 CT1 TAPERPNT 27 (SUTURE) ×2 IMPLANT
TRAY FOLEY MTR SLVR 16FR STAT (SET/KITS/TRAYS/PACK) IMPLANT
YANKAUER SUCT BULB TIP 10FT TU (MISCELLANEOUS) ×3 IMPLANT

## 2019-02-15 NOTE — H&P (Signed)
TOTAL HIP ADMISSION H&P  Patient is admitted for right total hip arthroplasty.  Subjective:  Chief Complaint: right hip pain  HPI: Brian Brewer, 54 y.o. male, has a history of pain and functional disability in the right hip(s) due to arthritis and patient has failed non-surgical conservative treatments for greater than 12 weeks to include NSAID's and/or analgesics, corticosteriod injections and activity modification.  Onset of symptoms was gradual starting 3 years ago with gradually worsening course since that time.The patient noted no past surgery on the right hip(s).  Patient currently rates pain in the right hip at 10 out of 10 with activity. Patient has night pain, worsening of pain with activity and weight bearing, pain that interfers with activities of daily living and pain with passive range of motion. Patient has evidence of subchondral sclerosis, periarticular osteophytes and joint space narrowing by imaging studies. This condition presents safety issues increasing the risk of falls.  There is no current active infection.  Patient Active Problem List   Diagnosis Date Noted  . Unilateral primary osteoarthritis, right hip 01/16/2019  . Chest pain 10/27/2017  . Bronchitis due to tobacco use 10/27/2017  . Psoriatic arthritis ([place]) 12/11/2013  . Hyperuricemia 12/11/2013  . HTN (hypertension), benign 10/22/2012  . Hyperglycemia 10/22/2012  . Hyperlipemia 10/22/2012  . Allergic rhinitis 05/21/2012  . GERD 05/27/2009  . ABDOMINAL PAIN, RIGHT UPPER QUADRANT 05/27/2009   Past Medical History:  Diagnosis Date  . Cancer ([place])    melanoma on the back was removed 10/2018  . GERD (gastroesophageal reflux disease)   . Gout   . Hyperlipemia   . Hypertension   . Psoriatic arthritis ([place]) 12/11/2013   Dr Amil Amen     Past Surgical History:  Procedure Laterality Date  . BACK SURGERY  2003   L4,5,S1 Fusion  . CHOLECYSTECTOMY    . COLONOSCOPY N/A 12/29/2016   Procedure: COLONOSCOPY;   Surgeon: Daneil Dolin, MD;  Location: [facility];  Service: Endoscopy;  Laterality: N/A;  9:15 AM  . LUMBAR FUSION     3 yrs ago-[place]    Current Facility-Administered Medications  Medication Dose Route Frequency Provider Last Rate Last Admin  . chlorhexidine (HIBICLENS) 4 % liquid 4 application  60 mL Topical Once Erskine Emery W, PA-C      . clindamycin (CLEOCIN) IVPB 900 mg  900 mg Intravenous On Call to OR Pete Pelt, PA-C      . lactated ringers infusion   Intravenous Continuous Murvin Natal, MD 75 mL/hr at 02/15/19 0629 New Bag at 02/15/19 [id number]  . tranexamic acid (CYKLOKAPRON) IVPB 1,000 mg  1,000 mg Intravenous To OR Pete Pelt, PA-C       Allergies  Allergen Reactions  . Amoxicillin-Pot Clavulanate Other (See Comments)    Cramps and diarrhea Has patient had a PCN reaction causing immediate rash, facial/tongue/throat swelling, SOB or lightheadedness with hypotension: No Has patient had a PCN reaction causing severe rash involving mucus membranes or skin necrosis: No Has patient had a PCN reaction that required hospitalization: No Has patient had a PCN reaction occurring within the last 10 years: No  If all of the above answers are "NO", then may proceed with Cephalosporin use.  . Levofloxacin Other (See Comments)    Passed out (possibly due to medication)  . Zithromax [Azithromycin] Diarrhea  . Chantix [Varenicline Tartrate] Nausea And Vomiting  . Oxycodone Diarrhea, Itching and Anxiety    Social History   Tobacco Use  .  Smoking status: Current Every Day Smoker    Packs/day: 0.50    Years: 20.00    Pack years: 10.00    Types: Cigarettes  . Smokeless tobacco: Never Used  Substance Use Topics  . Alcohol use: No    Family History  Problem Relation Age of Onset  . Heart disease Father   . Hypertension Father   . Leukemia Father   . Anesthesia problems Neg Hx   . Hypotension Neg Hx   . Malignant hyperthermia Neg Hx   . Pseudochol deficiency  Neg Hx      Review of Systems  All other systems reviewed and are negative.   Objective:  Physical Exam  Constitutional: He is oriented to person, place, and time. He appears well-developed and well-nourished.  HENT:  Head: Normocephalic and atraumatic.  Eyes: Pupils are equal, round, and reactive to light. EOM are normal.  Cardiovascular: Normal rate and regular rhythm.  Respiratory: Effort normal and breath sounds normal.  GI: Soft. Bowel sounds are normal.  Musculoskeletal:     Cervical back: Normal range of motion and neck supple.     Right hip: Tenderness and bony tenderness present. Decreased range of motion. Decreased strength.  Neurological: He is alert and oriented to person, place, and time.  Skin: Skin is warm and dry.  Psychiatric: He has a normal mood and affect.    Vital signs in last 24 hours: Temp:  [98.2 F (36.8 C)] 98.2 F (36.8 C) (01/15 0552) Pulse Rate:  [66] 66 (01/15 0552) Resp:  [14] 14 (01/15 0552) BP: (132)/(88) 132/88 (01/15 0552) SpO2:  [99 %] 99 % (01/15 0552) Weight:  [92.5 kg] 92.5 kg (01/15 0552)  Labs:   Estimated body mass index is 29.26 kg/m as calculated from the following:   Height as of 02/12/19: 5\' 10"  (1.778 m).   Weight as of this encounter: 92.5 kg.   Imaging Review Plain radiographs demonstrate severe degenerative joint disease of the right hip(s). The bone quality appears to be excellent for age and reported activity level.      Assessment/Plan:  End stage arthritis, right hip(s)  The patient history, physical examination, clinical judgement of the provider and imaging studies are consistent with end stage degenerative joint disease of the right hip(s) and total hip arthroplasty is deemed medically necessary. The treatment options including medical management, injection therapy, arthroscopy and arthroplasty were discussed at length. The risks and benefits of total hip arthroplasty were presented and reviewed. The risks  due to aseptic loosening, infection, stiffness, dislocation/subluxation,  thromboembolic complications and other imponderables were discussed.  The patient acknowledged the explanation, agreed to proceed with the plan and consent was signed. Patient is being admitted for inpatient treatment for surgery, pain control, PT, OT, prophylactic antibiotics, VTE prophylaxis, progressive ambulation and ADL's and discharge planning.The patient is planning to be discharged home with home health services    Patient's anticipated LOS is less than 2 midnights, meeting these requirements: - Younger than 62 - Lives within 1 hour of care - Has a competent adult at home to recover with post-op recover - NO history of  - Chronic pain requiring opiods  - Diabetes  - Coronary Artery Disease  - Heart failure  - Heart attack  - Stroke  - DVT/VTE  - Cardiac arrhythmia  - Respiratory Failure/COPD  - Renal failure  - Anemia  - Advanced Liver disease

## 2019-02-15 NOTE — Evaluation (Signed)
Physical Therapy Treatment Patient Details Name: Brian Brewer MRN: [id number] DOB: January 13, 1966 Today's Date: 02/15/2019    History of Present Illness Patient is 54 y.o. male s/p Rt THA anterior approach with PMH significant for psoriatic arthritis, gout, GERD, melanoma, HTN, HLD.    PT Comments    Brian Brewer is a 54 y.o. male POD 0 s/p Rt THA anterior approach. Patient reports independence with mobility at baseline. Patient is now limited by functional impairments (see PT problem list below) and requires supervision for transfers and gait with RW. Patient was able to ambulate ~!20 feet with RW and supervision and cues for safe walker management. Patient educated on safe sequencing for stair mobility and verbalized safe guarding position for people assisting with mobility. Patient instructed in exercises to facilitate ROM and circulation. Patient will benefit from continued skilled PT interventions to address impairments and progress towards PLOF. Patient has met mobility goals at adequate level for discharge home; will continue to follow if pt continues acute stay to progress towards Mod I goals.    Follow Up Recommendations  Follow surgeon's recommendation for DC plan and follow-up therapies     Equipment Recommendations  Rolling walker with 5" wheels(delivered to pt in PACU)    Recommendations for Other Services       Precautions / Restrictions Precautions Precautions: Fall Restrictions Weight Bearing Restrictions: No    Mobility  Bed Mobility Overal bed mobility: Needs Assistance Bed Mobility: Supine to Sit;Sit to Supine     Supine to sit: HOB elevated;Supervision Sit to supine: HOB elevated;Supervision   General bed mobility comments: educated on use of belt for assisting Rt LE, no assist required  Transfers Overall transfer level: Needs assistance Equipment used: Rolling walker (2 wheeled) Transfers: Sit to/from Stand Sit to Stand: Supervision         General  transfer comment: no assist required for power up, cues for safe hand placement  Ambulation/Gait Ambulation/Gait assistance: Supervision Gait Distance (Feet): 120 Feet Assistive device: Rolling walker (2 wheeled) Gait Pattern/deviations: Step-through pattern;Decreased stride length Gait velocity: slightly decreased   General Gait Details: cues for safe hand placement and to maintain safe proximity to RW, no overt LOB throughout   Stairs Stairs: Yes Stairs assistance: Supervision Stair Management: Sideways;One rail Right;Step to pattern Number of Stairs: 2(2x2) General stair comments: cue for safe step pattern "up with good, down with bad" pt safe with good posture and verbalized safe guarding position for person(s) assisting him   Wheelchair Mobility    Modified Rankin (Stroke Patients Only)       Balance Overall balance assessment: Needs assistance Sitting-balance support: Feet supported Sitting balance-Leahy Scale: Good     Standing balance support: Bilateral upper extremity supported;During functional activity Standing balance-Leahy Scale: Fair           Cognition Arousal/Alertness: Awake/alert Behavior During Therapy: WFL for tasks assessed/performed Overall Cognitive Status: Within Functional Limits for tasks assessed                   Exercises Total Joint Exercises Ankle Circles/Pumps: AROM;10 reps;Supine;Both Quad Sets: AROM;5 reps;Supine;Right Short Arc Quad: AROM;Supine;5 reps;Right Heel Slides: AAROM;5 reps;Supine;Right Hip ABduction/ADduction: AROM;5 reps;Supine;Right Long [employer]: AROM;5 reps;Seated;Right    General Comments        Pertinent Vitals/Pain Pain Assessment: 0-10 Pain Score: 2  Pain Location: Rt hip Pain Descriptors / Indicators: Burning Pain Intervention(s): Limited activity within patient's tolerance;Monitored during session;Repositioned    Home Living Family/patient expects to be discharged  to:: Private  residence Living Arrangements: Spouse/significant other;Children Available Help at Discharge: Family;Available 24 hours/day Type of Home: House(pt's wife has taken off all next week) Home Access: Stairs to enter Entrance Stairs-Rails: Right Home Layout: Two level Home Equipment: Shower seat - built in;Grab bars - tub/shower;Walker - 2 wheels;Cane - single point;Bedside commode Additional Comments: pt enjoys fishing    Prior Function Level of Independence: Independent      Comments: works full time for [employer]   PT Goals (current goals can now be found in the care plan section) Acute Rehab PT Goals Patient Stated Goal: to go home today and get back to fishing and to work PT Goal Formulation: With patient Time For Goal Achievement: 02/22/19 Potential to Achieve Goals: Good    Frequency    7X/week      PT Plan         AM-PAC PT "6 Clicks" Mobility   Outcome Measure  Help needed turning from your back to your side while in a flat bed without using bedrails?: None Help needed moving from lying on your back to sitting on the side of a flat bed without using bedrails?: None Help needed moving to and from a bed to a chair (including a wheelchair)?: A Little Help needed standing up from a chair using your arms (e.g., wheelchair or bedside chair)?: A Little Help needed to walk in hospital room?: A Little Help needed climbing 3-5 steps with a railing? : A Little 6 Click Score: 20    End of Session Equipment Utilized During Treatment: Gait belt Activity Tolerance: Patient tolerated treatment well Patient left: in bed;with call bell/phone within reach Nurse Communication: Mobility status PT Visit Diagnosis: Muscle weakness (generalized) (M62.81)     Time: 3912-2583 PT Time Calculation (min) (ACUTE ONLY): 35 min  Charges:  $Gait Training: 8-22 mins                    Verner Mould, DPT Physical Therapist with [facility] [phone]  02/15/2019 12:03 PM

## 2019-02-15 NOTE — Op Note (Signed)
NAME: Brian Brewer, Brian Brewer MEDICAL RECORD [id number] ACCOUNT [id number] DATE OF BIRTH:Oct 21, 1965 FACILITY: [facility] LOCATION: [facility] PHYSICIAN:Tomoya Ringwald Kerry Fort, MD  OPERATIVE REPORT  DATE OF PROCEDURE:  02/15/2019  PREOPERATIVE DIAGNOSIS:  Primary osteoarthritis and degenerative joint disease, right hip.  POSTOPERATIVE DIAGNOSIS:  Primary osteoarthritis and degenerative joint disease, right hip.  PROCEDURE:  Right total hip arthroplasty through direct anterior approach.  IMPLANTS:  DePuy Sector Gription acetabular component size 54, size 36+0 neutral polyethylene liner, size 4 Actis femoral component with high offset, size 36+8.5 ceramic hip ball.  SURGEON:  Lind Guest. Ninfa Linden, MD  ASSISTANT:  Erskine Emery, PA-C  ANESTHESIA:  General.  ANTIBIOTICS:  900 mg IV clindamycin.  ESTIMATED BLOOD LOSS:  100 mL.  COMPLICATIONS:  None.  INDICATIONS:  Brian Brewer (goes by middle name) is a 54 year old gentleman well known to me.  He has debilitating arthritis involving his right hip that has been well documented.  He has tried and failed all forms of conservative treatment from well over a year  now.  His right hip pain has become daily and it is detrimentally affecting his mobility, his quality of life, and his activities of daily living to the point he does wish to proceed with a total hip arthroplasty.  We had a long and thorough discussion  about this, showing a hip model in the office.  We went over his x-rays in detail.  We talked about the risk of acute blood loss anemia, nerve or vessel injury, fracture, infection, dislocation, DVT, and implant failure.  I talked about the goals being  decreased pain, improve mobility and overall improve quality of life.  DESCRIPTION OF PROCEDURE:  After informed consent was obtained and appropriate right hip was marked he was brought to the operating room where general anesthesia was obtained while he was on a stretcher.  Traction boots were  placed on both his feet.   Next, he was placed supine on the Hana fracture table with a perineal post in place and both legs in line skeletal traction device and no traction applied.  His right operative hip was prepped and draped with DuraPrep and sterile drapes.  A time-out was  called.  He was identified as correct patient, correct right hip.  I then made an incision just inferior and posterior to the anterior superior iliac spine and carried this obliquely down the leg.  We dissected down tensor fascia lata muscle.  Tensor  fascia was then divided longitudinally to proceed with direct anterior approach to the hip.  We identified and cauterized circumflex vessels and identified the hip capsule, opened the hip capsule in an L-type format, finding moderate joint effusion and  significant periarticular osteophytes around the femoral head and neck.  I placed Cobra retractors around the medial and lateral femoral neck and then we made our femoral neck cut with an oscillating saw and completed this with an osteotome.  I placed a  corkscrew guide in the femoral head and removed the femoral head in its entirety and found a wide area devoid of cartilage.  I then placed a bent Hohmann over the medial acetabular rim and removed remnants of the acetabular labrum and other debris.  We  then began reaming under direct visualization from a size 44 reamer in stepwise increments up to a size 53.  All reamers were placed under direct visualization.  The last was placed under direct fluoroscopy, so we could obtain our depth of reaming, our  inclination and anteversion.  I then placed  the real DePuy Sector Gription acetabular component size 54 and a 36+0 neutral polyethylene liner for that size acetabular component.  Attention was then turned to the femur.  With the leg externally rotated to  120 degrees, extended and adducted, we placed a Mueller retractor medially and Hohman retractor behind the greater trochanter,  released lateral joint capsule and used a box-cutting osteotome to enter the femoral canal and a rongeur to lateralize and  then began broaching using the Actis broaching system from a size zero up to a size 4.  With a size place based off his anatomy, we trialed a high offset femoral neck and 36+1.5 hip ball, reduced this in the acetabulum and assessed it radiographically  and mechanically.  I felt like based on what we were seeing we definitely needed to go high offset neck, but we needed to go up at least 2 hip ball sizes.  We dislocated the hip and removed the trial components.  I then placed the real high offset Actis  femoral component size 4.  We went with a 36+8.5 ceramic hip ball, reduced this in the acetabulum and I was definitely pleased with leg length, offset, range of motion and stability assessed mechanically and radiographically.  We then irrigated the soft  tissue with normal saline solution using pulsatile lavage.  I was able to reapproximate joint capsule tissue with #1 Ethibond suture.  The tensor fascia was closed with #1 Vicryl followed by 0 Vicryl to close deep tissue, 2-0 Vicryl to close subcutaneous  tissue, and interrupted staples to close the skin.  Xeroform and Aquacel dressing was applied.  He was awakened, extubated, and taken to recovery room in stable condition.  All final counts were correct.  There were no complications noted.  Of note, Benita Stabile, PA-C, assisted during the entire case.  Her assistance was crucial for facilitating all aspects of this case.  CN/NUANCE  D:02/15/2019 T:02/15/2019 JOB:[id number]

## 2019-02-15 NOTE — Anesthesia Procedure Notes (Signed)
Procedure Name: Intubation Date/Time: 02/15/2019 7:22 AM Performed by: Maxwell Caul, CRNA Pre-anesthesia Checklist: Patient identified, Emergency Drugs available, Suction available and Patient being monitored Patient Re-evaluated:Patient Re-evaluated prior to induction Oxygen Delivery Method: Circle system utilized Preoxygenation: Pre-oxygenation with 100% oxygen Induction Type: IV induction Ventilation: Mask ventilation without difficulty Laryngoscope Size: Mac and 4 Grade View: Grade I Tube type: Oral Tube size: 7.5 mm Number of attempts: 1 Airway Equipment and Method: Stylet Placement Confirmation: ETT inserted through vocal cords under direct vision,  positive ETCO2 and breath sounds checked- equal and bilateral Secured at: 22 cm Tube secured with: Tape Dental Injury: Teeth and Oropharynx as per pre-operative assessment

## 2019-02-15 NOTE — Brief Op Note (Signed)
02/15/2019  8:27 AM  PATIENT:  Brian Brewer  54 y.o. male  PRE-OPERATIVE DIAGNOSIS:  osteoarthritis right hip  POST-OPERATIVE DIAGNOSIS:  osteoarthritis right hip  PROCEDURE:  Procedure(s): RIGHT TOTAL HIP ARTHROPLASTY ANTERIOR APPROACH (Right)  SURGEON:  Surgeon(s) and Role:    Mcarthur Rossetti, MD - Primary  PHYSICIAN ASSISTANT:  Benita Stabile, PA-C  ANESTHESIA:   general  EBL:  100 mL   COUNTS:  YES  DICTATION: .Other Dictation: Dictation Number [id number]  PLAN OF CARE: Discharge to home after PACU  PATIENT DISPOSITION:  PACU - hemodynamically stable.   Delay start of Pharmacological VTE agent (>24hrs) due to surgical blood loss or risk of bleeding: no

## 2019-02-15 NOTE — Anesthesia Postprocedure Evaluation (Signed)
Anesthesia Post Note  Patient: Brian Brewer  Procedure(s) Performed: RIGHT TOTAL HIP ARTHROPLASTY ANTERIOR APPROACH (Right Hip)     Patient location during evaluation: PACU Anesthesia Type: General Level of consciousness: awake and alert Pain management: pain level controlled Vital Signs Assessment: post-procedure vital signs reviewed and stable Respiratory status: spontaneous breathing, nonlabored ventilation, respiratory function stable and patient connected to nasal cannula oxygen Cardiovascular status: blood pressure returned to baseline and stable Postop Assessment: no apparent nausea or vomiting Anesthetic complications: no    Last Vitals:  Vitals:   02/15/19 1100 02/15/19 1200  BP:  (!) 148/79  Pulse:    Resp: 16 16  Temp:  36.6 C  SpO2:      Last Pain:  Vitals:   02/15/19 1200  TempSrc:   PainSc: 2                  Jahmiya Guidotti P Tajai Ihde

## 2019-02-15 NOTE — Progress Notes (Signed)
PACU NURSING NOTE, phase II: DC instructions provided to patient, PT provided education as well. Alert and oriented x 4, pain under controll, VSS. Surgical site WNL. Tolerated POs well, utilizes walker well, gait steady with walker. DC instructions reviewed with stressing importance of safety while taking PO pain meds, also discussed follow up MD appointment importance. Discussed s&s of infection and also discussed times to call MD. Post General Anesthesia home care reviewed as well. Opportunity for questions provided multiple times. Pt placed in wheelchair and escorted to exit

## 2019-02-15 NOTE — Discharge Instructions (Signed)

## 2019-02-15 NOTE — Transfer of Care (Signed)
Immediate Anesthesia Transfer of Care Note  Patient: Brian Brewer  Procedure(s) Performed: RIGHT TOTAL HIP ARTHROPLASTY ANTERIOR APPROACH (Right Hip)  Patient Location: PACU  Anesthesia Type:General  Level of Consciousness: awake, alert  and oriented  Airway & Oxygen Therapy: Patient Spontanous Breathing and Patient connected to face mask oxygen  Post-op Assessment: Report given to RN  Post vital signs: Reviewed and stable  Last Vitals:  Vitals Value Taken Time  BP    Temp    Pulse    Resp    SpO2      Last Pain:  Vitals:   02/15/19 0627  TempSrc:   PainSc: 5          Complications: No apparent anesthesia complications

## 2019-02-15 NOTE — Care Plan (Signed)
RNCM spoke with patient yesterday prior to his surgery today with Dr. Ninfa Linden for a right THA. Patient is not an ortho bundle patient, but it was requested that CM assist with setting up all discharge needs in anticipation of same day discharge. Patient has a spouse at home, who can assist after discharge. Patient has a BSC/3in1 at home, but will need a FWW. DME ordered through [facility] to be delivered to bedside before discharge today. Anticipate home health PT will be needed after discharge. Choice provided and referral made to [facility]. F/U with MD scheduled for 2 weeks on 02/28/19 at 4:15 pm with Dr. Ninfa Linden. Reviewed all pre- and post-op instructions with patient.

## 2019-02-18 ENCOUNTER — Encounter: Payer: Self-pay | Admitting: *Deleted

## 2019-02-18 ENCOUNTER — Telehealth: Payer: Self-pay | Admitting: *Deleted

## 2019-02-18 ENCOUNTER — Telehealth: Payer: Self-pay | Admitting: Orthopaedic Surgery

## 2019-02-18 NOTE — Telephone Encounter (Signed)
RNCM call back to patient and updated that Dr. Ninfa Linden is out of office today, but did speak with his PA, who informed that Gabapentin, which he already has in his home, can be restarted. 1 tablet daily. This may help with nerve type pain he's experiencing near incision. Will hold off on changing muscle relaxer at this time.Patient verbalized understanding of instructions.

## 2019-02-18 NOTE — Telephone Encounter (Signed)
Brian Brewer with [facility] called in requesting verbal order for plan of care for 1 time a week for 1 week, 2 times a week for 2 weeks and 1 time a week for 1 week.   [phone]

## 2019-02-18 NOTE — Telephone Encounter (Signed)
Spoke to patient this morning after discharge Friday. He is having burning/shooting pain around incision site. Asked what can be done for this. Also have significant muscle spasming in quadriceps and down entire back of surgical leg. Therapy saw him over the weekend and showed wife how to massage muscles, but did tell them his muscles were extremely tight and definitely spasming. He is taking Robaxin- seems to not be helping. Thanks.

## 2019-02-18 NOTE — Telephone Encounter (Signed)
Verbal order given  

## 2019-02-19 ENCOUNTER — Other Ambulatory Visit: Payer: Self-pay | Admitting: Orthopaedic Surgery

## 2019-02-19 MED ORDER — GABAPENTIN 100 MG PO CAPS: 100.0000 mg | ORAL_CAPSULE | Freq: Three times a day (TID) | ORAL | 1 refills | Status: DC

## 2019-02-19 MED ORDER — TIZANIDINE HCL 4 MG PO TABS: 4.0000 mg | ORAL_TABLET | Freq: Three times a day (TID) | ORAL | 0 refills | Status: DC | PRN

## 2019-02-19 NOTE — Telephone Encounter (Signed)
I sent in some Zanaflex to try and some neurontin to try.

## 2019-02-20 ENCOUNTER — Other Ambulatory Visit: Payer: Self-pay | Admitting: Orthopaedic Surgery

## 2019-02-20 ENCOUNTER — Telehealth: Payer: Self-pay | Admitting: Orthopaedic Surgery

## 2019-02-20 ENCOUNTER — Telehealth: Payer: Self-pay | Admitting: *Deleted

## 2019-02-20 MED ORDER — HYDROMORPHONE HCL 2 MG PO TABS: 2.0000 mg | ORAL_TABLET | ORAL | 0 refills | Status: DC | PRN

## 2019-02-20 NOTE — Telephone Encounter (Signed)
Patient called.   He is requesting a refill of his HYDROmorphone.   Call back number: [phone]

## 2019-02-20 NOTE — Telephone Encounter (Signed)
Please advise 

## 2019-02-20 NOTE — Telephone Encounter (Signed)
Call to patient and informed that Dr. Ninfa Linden did change his muscle relaxer to Zanaflex to see if this would help with spasming better than Robaxin. Also informed that refill of pain medication he called this morning requesting has been sent in as well. Neurontin 100 mg sent to pharmacy as well, but informed that he already has in his home. Instructed to pick up as he needs it and to call back with any questions or concerns.

## 2019-02-28 ENCOUNTER — Ambulatory Visit (INDEPENDENT_AMBULATORY_CARE_PROVIDER_SITE_OTHER): Payer: Commercial Managed Care - PPO | Admitting: Orthopaedic Surgery

## 2019-02-28 ENCOUNTER — Encounter: Payer: Self-pay | Admitting: Orthopaedic Surgery

## 2019-02-28 ENCOUNTER — Other Ambulatory Visit: Payer: Self-pay

## 2019-02-28 DIAGNOSIS — Z96641 Presence of right artificial hip joint: Secondary | ICD-10-CM

## 2019-02-28 MED ORDER — HYDROCODONE-ACETAMINOPHEN 5-325 MG PO TABS: 1.0000 | ORAL_TABLET | Freq: Four times a day (QID) | ORAL | 0 refills | Status: DC | PRN

## 2019-02-28 NOTE — Progress Notes (Signed)
The patient is now 13 days status post a right total hip arthroplasty.  He states that he is doing well.  He is back down to just 1 aspirin a day since he has been active.  He is been released from home health therapy.  He feels like he can return to work Monday.  He is already driven as well.  On examination remove the staples in place Steri-Strips over his incision.  It looks good.  It is bruised.  His calf is soft.  His leg lengths appear equal.  At this point he will continue to slowly increase his activities as comfort allows.  I will send in some hydrocodone for him.  If he needs more between now in 4 weeks we will see him back he will let us know.  All questions and concerns were answered and addressed.

## 2019-03-28 ENCOUNTER — Encounter: Payer: Self-pay | Admitting: Orthopaedic Surgery

## 2019-03-28 ENCOUNTER — Ambulatory Visit (INDEPENDENT_AMBULATORY_CARE_PROVIDER_SITE_OTHER): Payer: Commercial Managed Care - PPO | Admitting: Orthopaedic Surgery

## 2019-03-28 ENCOUNTER — Other Ambulatory Visit: Payer: Self-pay

## 2019-03-28 DIAGNOSIS — Z96641 Presence of right artificial hip joint: Secondary | ICD-10-CM

## 2019-03-28 MED ORDER — HYDROCODONE-ACETAMINOPHEN 5-325 MG PO TABS: 1.0000 | ORAL_TABLET | Freq: Four times a day (QID) | ORAL | 0 refills | Status: DC | PRN

## 2019-03-28 NOTE — Progress Notes (Signed)
HPI: Brian Brewer returns today 6 weeks status post right total hip arthroplasty.  States his right hip is doing very well.  His only complaint is some low back pain.  7 no radicular symptoms.  Is a history of L4-S1 fusion.  MRI in January 2020 showed this is well-perfused without any complicating features.  Mild to moderate multifactorial spinal and lateral recess stenosis at L3-4.  He states the use of Flector patch on his back that helps also he is having difficulty sleeping due to the low back pain he takes half tablet of Norco which helps him sleep.  He has tried muscle relaxants and some stretching exercises on his own at home with these is not really been beneficial.  Physical exam: General well-developed well-nourished male no acute distress mood affect appropriate. Right hip good range of motion without pain.  Right calf supple nontender.  Left hip good range of motion without pain.  Negative straight leg raise bilaterally.  He is limited forward flexion of the lumbar spine and comes within 6 inches of touching his toes.  Impression: 6 weeks status post right total hip arthroplasty Low back pain without radicular symptoms  Plan: We will refill his Norco which he is to use sparingly.  Like to see him back in 1 month to see how he is overall progressing.  Recommend continuing his back exercises.

## 2019-04-25 ENCOUNTER — Encounter: Payer: Self-pay | Admitting: Orthopaedic Surgery

## 2019-04-25 ENCOUNTER — Other Ambulatory Visit: Payer: Self-pay

## 2019-04-25 ENCOUNTER — Ambulatory Visit (INDEPENDENT_AMBULATORY_CARE_PROVIDER_SITE_OTHER): Payer: Commercial Managed Care - PPO | Admitting: Orthopaedic Surgery

## 2019-04-25 DIAGNOSIS — Z96641 Presence of right artificial hip joint: Secondary | ICD-10-CM

## 2019-04-25 NOTE — Progress Notes (Signed)
Brian Brewer comes in today almost 10-week status post a right total hip arthroplasty.  He has had extensive spine surgery in the past.  At his last visit he was having significant issues with his spine.  We put him on some hydrocodone and he said he only needed it for about 2 days and now he is doing significantly better overall.  He really has no issues he states.  He is walking upright and not using assistive device.  There is no limp.  His leg lengths are equal.  He tolerates me putting his right hip through range of motion.  His only complaint is some stiffness and tightness more so around the IT band at the trochanteric area of his hip.  He says it does not hurt though.  At this point we will not need to see him back for 6 months.  At that 69-month visit I would like a standing low AP pelvis and lateral of his right operative hip.  Obviously if there is any issues before then he knows to let us know.

## 2019-05-15 ENCOUNTER — Ambulatory Visit: Payer: Self-pay

## 2019-05-15 ENCOUNTER — Encounter: Payer: Self-pay | Admitting: Orthopaedic Surgery

## 2019-05-15 ENCOUNTER — Other Ambulatory Visit: Payer: Self-pay

## 2019-05-15 ENCOUNTER — Ambulatory Visit (INDEPENDENT_AMBULATORY_CARE_PROVIDER_SITE_OTHER): Payer: Commercial Managed Care - PPO | Admitting: Orthopaedic Surgery

## 2019-05-15 DIAGNOSIS — M25512 Pain in left shoulder: Secondary | ICD-10-CM | POA: Diagnosis not present

## 2019-05-15 NOTE — Progress Notes (Signed)
Office Visit Note   Patient: Brian Brewer           Date of Birth: 17-Nov-1965           MRN: [id number] Visit Date: 05/15/2019              Requested by: Kathyrn Drown, MD [place],  [place] [address] PCP: Kathyrn Drown, MD   Assessment & Plan: Visit Diagnoses:  1. Left shoulder pain, unspecified chronicity     Plan: Due to the fact that he has had 1  Covid vaccines and is due for the 2nd on May 3rd recommend holding off on any type of cortisone injection.    Follow-up with [place] in 2 weeks after the injection if in fact the continues to have shoulder pain.  In the interim he is given shoulder exercises to perform Thera-Band.  Questions were encouraged and answered.  He does have follow-up with [place] sometime later in the summer for his right total hip arthroplasty follow-up.  Follow-Up Instructions: Return if symptoms worsen or fail to improve.   Orders:  Orders Placed This Encounter  Procedures  . XR Shoulder Left   No orders of the defined types were placed in this encounter.     Procedures: No procedures performed   Clinical Data: No additional findings.   Subjective: Chief Complaint  Patient presents with  . Left Shoulder - Pain    HPI [name] is well-known to Dr. Ninfa Linden service comes in today for left shoulder pain for the last 2 weeks.  He states he is putting out mulch and had some pain in his arm and shoulder after this.  He notes overhead activity and reaching across to his chest or behind him causes him pain in the shoulder.  Denies any numbness tingling down the arm.  He has been trying icy hot and Tylenol for pain.  Review of Systems Please see HPI otherwise negative or noncontributory.  Objective: Vital Signs: There were no vitals taken for this visit.  Physical Exam Constitutional:      Appearance: He is not ill-appearing or diaphoretic.  Pulmonary:     Effort: Pulmonary effort is normal.  Neurological:     Mental  Status: He is alert and oriented to person, place, and time.  Psychiatric:        Mood and Affect: Mood normal.     Ortho Exam Left shoulder positive impingement.  Full forward flexion both shoulders actively.  5-5 strength with external and internal rotation is to resistance.  Empty can test is negative bilaterally.  Liftoff test negative bilaterally. Specialty Comments:  No specialty comments available.  Imaging: XR Shoulder Left  Result Date: 05/15/2019 Left shoulder 3 views: No acute fracture.  AC joint arthritic changes.  Glenohumeral joint well-maintained.  Subacromial space well maintained.  Shoulders well located.  No bony abnormalities otherwise.    PMFS History: Patient Active Problem List   Diagnosis Date Noted  . 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 Unilateral primary osteoarthritis, right hip 01/16/2019  . Chest pain 10/27/2017  . Bronchitis due to tobacco use 10/27/2017  . Psoriatic arthritis ([place]) 12/11/2013  . Hyperuricemia 12/11/2013  . HTN (hypertension), benign 10/22/2012  . Hyperglycemia 10/22/2012  . Hyperlipemia 10/22/2012  . Allergic rhinitis 05/21/2012  . GERD 05/27/2009  . ABDOMINAL PAIN, RIGHT UPPER QUADRANT 05/27/2009   Past Medical History:  Diagnosis Date  . Cancer ([place])  melanoma on the back was removed 10/2018  . GERD (gastroesophageal reflux disease)   . Gout   . Hyperlipemia   . Hypertension   . Psoriatic arthritis ([place]) 12/11/2013   Dr Amil Amen     Family History  Problem Relation Age of Onset  . Heart disease Father   . Hypertension Father   . Leukemia Father   . Anesthesia problems Neg Hx   . Hypotension Neg Hx   . Malignant hyperthermia Neg Hx   . Pseudochol deficiency Neg Hx     Past Surgical History:  Procedure Laterality Date  . BACK SURGERY  2003   L4,5,S1 Fusion  . CHOLECYSTECTOMY    . COLONOSCOPY N/A 12/29/2016   Procedure: COLONOSCOPY;  Surgeon: Daneil Dolin, MD;  [facility];   Service: Endoscopy;  Laterality: N/A;  9:15 AM  . LUMBAR FUSION     3 yrs ago-[place]  . TOTAL HIP ARTHROPLASTY Right 02/15/2019   Procedure: RIGHT TOTAL HIP ARTHROPLASTY ANTERIOR APPROACH;  Surgeon: Mcarthur Rossetti, MD;  Location: [facility];  Service: Orthopedics;  Laterality: Right;   Social History   Occupational History  . Not on file  Tobacco Use  . Smoking status: Current Every Day Smoker    Packs/day: 0.50    Years: 20.00    Pack years: 10.00    Types: Cigarettes  . Smokeless tobacco: Never Used  Substance and Sexual Activity  . Alcohol use: No  . Drug use: No  . Sexual activity: Yes    Birth control/protection: None

## 2019-07-06 ENCOUNTER — Other Ambulatory Visit: Payer: Self-pay | Admitting: Family Medicine

## 2019-07-08 NOTE — Telephone Encounter (Signed)
Please schedule and then route back to nurses to send in refills 

## 2019-07-08 NOTE — Telephone Encounter (Signed)
Medication follow up scheduled for 7/13

## 2019-07-08 NOTE — Telephone Encounter (Signed)
This +4 refills follow-up later this summer

## 2019-07-08 NOTE — Telephone Encounter (Signed)
Last med check up 01/15/19

## 2019-07-25 ENCOUNTER — Encounter: Payer: Self-pay | Admitting: Orthopaedic Surgery

## 2019-07-25 ENCOUNTER — Ambulatory Visit (INDEPENDENT_AMBULATORY_CARE_PROVIDER_SITE_OTHER): Payer: Commercial Managed Care - PPO | Admitting: Orthopaedic Surgery

## 2019-07-25 ENCOUNTER — Other Ambulatory Visit: Payer: Self-pay

## 2019-07-25 DIAGNOSIS — M25512 Pain in left shoulder: Secondary | ICD-10-CM

## 2019-07-25 MED ORDER — METHYLPREDNISOLONE ACETATE 40 MG/ML IJ SUSP
40.0000 mg | INTRAMUSCULAR | Status: AC | PRN
  Administered 2019-07-25: 40 mg via INTRA_ARTICULAR

## 2019-07-25 MED ORDER — LIDOCAINE HCL 1 % IJ SOLN
3.0000 mL | INTRAMUSCULAR | Status: AC | PRN
  Administered 2019-07-25: 3 mL

## 2019-07-25 NOTE — Progress Notes (Signed)
Office Visit Note   Patient: Brian Brewer           Date of Birth: October 27, 1965           MRN: [id number] Visit Date: 07/25/2019              Requested by: Kathyrn Drown, MD [place],  [place] [address] PCP: Kathyrn Drown, MD   Assessment & Plan: Visit Diagnoses:  1. Left shoulder pain, unspecified chronicity     Plan: I did place a steroid injection in his left shoulder subacromial outlet per his request.  All question concerns were answered and addressed.  We will see him back in about 6 weeks to see how he is doing overall.  If he does not have any significant relief, I would recommend a MRI of the left shoulder to rule out any type of pathology such as rotator cuff tear.  Follow-Up Instructions: Return in about 6 weeks (around 09/05/2019).   Orders:  Orders Placed This Encounter  Procedures  . Large Joint Inj   No orders of the defined types were placed in this encounter.     Procedures: Large Joint Inj: L subacromial bursa on 07/25/2019 4:23 PM Indications: pain and diagnostic evaluation Details: 22 G 1.5 in needle  Arthrogram: No  Medications: 3 mL lidocaine 1 %; 40 mg methylPREDNISolone acetate 40 MG/ML Outcome: tolerated well, no immediate complications Procedure, treatment alternatives, risks and benefits explained, specific risks discussed. Consent was given by the patient. Immediately prior to procedure a time out was called to verify the correct patient, procedure, equipment, support staff and site/side marked as required. Patient was prepped and draped in the usual sterile fashion.       Clinical Data: No additional findings.   Subjective: Chief Complaint  Patient presents with  . Left Shoulder - Pain  The patient comes in today with left shoulder pain and requesting steroid injection in his left shoulder.  We are going to inject him another time but he was in between Covid vaccinations.  He has problems with overhead activities  and lifting above his head.  He does a lot of heavy manual labor.  He denies any specific injury to the shoulder but he is worked hard with his upper extremities for many years now.  It does wake him up at night on occasion.  Is mainly with abduction and overhead activities that gives him both pain and problems.  HPI  Review of Systems He currently denies any headache, chest pain, shortness of breath, fever, chills, nausea, vomiting  Objective: Vital Signs: There were no vitals taken for this visit.  Physical Exam He is alert and orient x3 and in no acute distress Ortho Exam Examination of his left shoulder shows is well located and has full range of motion.  He has a lot of pain at the [facility] joint and is actually clicking when I do externally rotate and abduct his shoulder in different positions and come across this front.  His internal rotation with adduction is normal and his liftoff is negative. Specialty Comments:  No specialty comments available.  Imaging: No results found.   PMFS History: Patient Active Problem List   Diagnosis Date Noted  . 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 Unilateral primary osteoarthritis, right hip 01/16/2019  . Chest pain 10/27/2017  . Bronchitis due to tobacco use 10/27/2017  . Psoriatic arthritis ([place]) 12/11/2013  . Hyperuricemia 12/11/2013  .  HTN (hypertension), benign 10/22/2012  . Hyperglycemia 10/22/2012  . Hyperlipemia 10/22/2012  . Allergic rhinitis 05/21/2012  . GERD 05/27/2009  . ABDOMINAL PAIN, RIGHT UPPER QUADRANT 05/27/2009   Past Medical History:  Diagnosis Date  . Cancer ([place])    melanoma on the back was removed 10/2018  . GERD (gastroesophageal reflux disease)   . Gout   . Hyperlipemia   . Hypertension   . Psoriatic arthritis ([place]) 12/11/2013   Dr Amil Amen     Family History  Problem Relation Age of Onset  . Heart disease Father   . Hypertension Father   . Leukemia Father   . Anesthesia problems Neg Hx    . Hypotension Neg Hx   . Malignant hyperthermia Neg Hx   . Pseudochol deficiency Neg Hx     Past Surgical History:  Procedure Laterality Date  . BACK SURGERY  2003   L4,5,S1 Fusion  . CHOLECYSTECTOMY    . COLONOSCOPY N/A 12/29/2016   Procedure: COLONOSCOPY;  Surgeon: Daneil Dolin, MD;  [facility];  Service: Endoscopy;  Laterality: N/A;  9:15 AM  . LUMBAR FUSION     3 yrs ago-[place]  . TOTAL HIP ARTHROPLASTY Right 02/15/2019   Procedure: RIGHT TOTAL HIP ARTHROPLASTY ANTERIOR APPROACH;  Surgeon: Mcarthur Rossetti, MD;  Location[facility];  Service: Orthopedics;  Laterality: Right;   Social History   Occupational History  . Not on file  Tobacco Use  . Smoking status: Current Every Day Smoker    Packs/day: 0.50    Years: 20.00    Pack years: 10.00    Types: Cigarettes  . Smokeless tobacco: Never Used  Vaping Use  . Vaping Use: Never used  Substance and Sexual Activity  . Alcohol use: No  . Drug use: No  . Sexual activity: Yes    Birth control/protection: None

## 2019-08-13 ENCOUNTER — Ambulatory Visit (INDEPENDENT_AMBULATORY_CARE_PROVIDER_SITE_OTHER): Payer: Commercial Managed Care - PPO | Admitting: Family Medicine

## 2019-08-13 ENCOUNTER — Telehealth: Payer: Self-pay | Admitting: Family Medicine

## 2019-08-13 ENCOUNTER — Encounter: Payer: Self-pay | Admitting: Family Medicine

## 2019-08-13 ENCOUNTER — Other Ambulatory Visit: Payer: Self-pay

## 2019-08-13 VITALS — BP 124/74 | Temp 97.7°F | Wt 203.2 lb

## 2019-08-13 DIAGNOSIS — E790 Hyperuricemia without signs of inflammatory arthritis and tophaceous disease: Secondary | ICD-10-CM | POA: Diagnosis not present

## 2019-08-13 DIAGNOSIS — I10 Essential (primary) hypertension: Secondary | ICD-10-CM

## 2019-08-13 DIAGNOSIS — E7849 Other hyperlipidemia: Secondary | ICD-10-CM

## 2019-08-13 DIAGNOSIS — L4050 Arthropathic psoriasis, unspecified: Secondary | ICD-10-CM

## 2019-08-13 MED ORDER — PRAVASTATIN SODIUM 40 MG PO TABS: 40.0000 mg | ORAL_TABLET | Freq: Every evening | ORAL | 5 refills | Status: DC

## 2019-08-13 MED ORDER — ESOMEPRAZOLE MAGNESIUM 40 MG PO CPDR: 40.0000 mg | DELAYED_RELEASE_CAPSULE | Freq: Every day | ORAL | 5 refills | Status: DC

## 2019-08-13 MED ORDER — LISINOPRIL-HYDROCHLOROTHIAZIDE 10-12.5 MG PO TABS: 1.0000 | ORAL_TABLET | Freq: Every day | ORAL | 5 refills | Status: DC

## 2019-08-13 MED ORDER — ALLOPURINOL 100 MG PO TABS: ORAL_TABLET | ORAL | 5 refills | Status: DC

## 2019-08-13 NOTE — Telephone Encounter (Signed)
Last labs completed on 02/06/19 PSA, Uric Acid, BMET, Hepatic and Lipid. Please advise. Thank you

## 2019-08-13 NOTE — Telephone Encounter (Signed)
Patient needing labs for physical in November

## 2019-08-13 NOTE — Progress Notes (Signed)
   Subjective:    Patient ID: Brian Brewer, male    DOB: Aug 03, 1965, 54 y.o.   MRN: [id number]  Hypertension This is a chronic problem. Pertinent negatives include no chest pain, headaches or shortness of breath. There are no compliance problems.    Patient states he is doing well with taking his medicine Has psoriatic arthritis under good control Recent hip replacement Tolerating that well Taking his cholesterol medicine on a regular basis Taking his blood pressure medicine pt here for follow up. Pt states he is doing well. No issues no concerns.    Review of Systems  Constitutional: Negative for activity change, fatigue and fever.  HENT: Negative for congestion and rhinorrhea.   Respiratory: Negative for cough and shortness of breath.   Cardiovascular: Negative for chest pain and leg swelling.  Gastrointestinal: Negative for abdominal pain, diarrhea and nausea.  Genitourinary: Negative for dysuria and hematuria.  Neurological: Negative for weakness and headaches.  Psychiatric/Behavioral: Negative for agitation and behavioral problems.       Objective:   Physical Exam Vitals reviewed.  Constitutional:      General: He is not in acute distress. HENT:     Head: Normocephalic and atraumatic.  Eyes:     General:        Right eye: No discharge.        Left eye: No discharge.  Neck:     Trachea: No tracheal deviation.  Cardiovascular:     Rate and Rhythm: Normal rate and regular rhythm.     Heart sounds: Normal heart sounds. No murmur heard.   Pulmonary:     Effort: Pulmonary effort is normal. No respiratory distress.     Breath sounds: Normal breath sounds.  Lymphadenopathy:     Cervical: No cervical adenopathy.  Skin:    General: Skin is warm and dry.  Neurological:     Mental Status: He is alert.     Coordination: Coordination normal.  Psychiatric:        Behavior: Behavior normal.           Assessment & Plan:  HTN- patient seen for follow-up regarding  HTN.  Diet, medication compliance, appropriate labs and refills were completed.  Importance of keeping blood pressure under good control to lessen the risk of complications discussed Continue blood pressure medicine  Hyperlipidemia-importance of diet, weight control, activity, compliance with medications discussed.  Recent labs reviewed.  Any additional labs or refills ordered.  Importance of keeping under good control discussed. Continue medication.

## 2019-08-18 NOTE — Telephone Encounter (Signed)
Lipid met7, psa, uric acid Gout,hyperlip wellness

## 2019-08-19 ENCOUNTER — Other Ambulatory Visit: Payer: Self-pay | Admitting: *Deleted

## 2019-08-19 DIAGNOSIS — Z0000 Encounter for general adult medical examination without abnormal findings: Secondary | ICD-10-CM

## 2019-08-19 DIAGNOSIS — E790 Hyperuricemia without signs of inflammatory arthritis and tophaceous disease: Secondary | ICD-10-CM

## 2019-08-19 DIAGNOSIS — Z125 Encounter for screening for malignant neoplasm of prostate: Secondary | ICD-10-CM

## 2019-08-19 DIAGNOSIS — E7849 Other hyperlipidemia: Secondary | ICD-10-CM

## 2019-08-19 NOTE — Telephone Encounter (Signed)
Labs ordered and pt notified

## 2019-09-05 ENCOUNTER — Ambulatory Visit (INDEPENDENT_AMBULATORY_CARE_PROVIDER_SITE_OTHER): Payer: Commercial Managed Care - PPO | Admitting: Orthopaedic Surgery

## 2019-09-05 ENCOUNTER — Other Ambulatory Visit: Payer: Self-pay

## 2019-09-05 ENCOUNTER — Encounter: Payer: Self-pay | Admitting: Orthopaedic Surgery

## 2019-09-05 DIAGNOSIS — M25512 Pain in left shoulder: Secondary | ICD-10-CM

## 2019-09-05 NOTE — Progress Notes (Signed)
Office Visit Note   Patient: Brian Brewer           Date of Birth: 12-31-1965           MRN: [id number] Visit Date: 09/05/2019              Requested by: Brian Drown, MD [place],  [place] [address] PCP: Brian Drown, MD   Assessment & Plan: Visit Diagnoses:  1. Left shoulder pain, unspecified chronicity     Plan:  Due to the fact that patient continues to have left shoulder pain despite conservative treatment which is consistent with time, shoulder exercises and to subacromial injections and given his clinical exam today recommend MRI of the left shoulder rule out rotator cuff tear.  He will follow-up after the MRI to go over results and discuss further treatment.  Questions were encouraged and answered.  Follow-Up Instructions: Return after MRI.   Orders:  No orders of the defined types were placed in this encounter.  No orders of the defined types were placed in this encounter.     Procedures: No procedures performed   Clinical Data: No additional findings.   Subjective: Chief Complaint  Patient presents with   Left Shoulder - Follow-up    HPI [name] returns today follow-up left shoulder 6 weeks status post subacromial injection.  He states the injection lasted for about 2 weeks.  He is having clicking in the shoulder which he feels is getting worse.  He states he has pain with overhead activity and also when sleeping at night.  He is taking Tylenol for the pain.  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  He has had 2 subacromial injections and tried shoulder exercises. Review of Systems Negative for fevers or chills.  Objective: Vital Signs: There were no vitals taken for this visit.  Physical Exam General: Well-developed well-nourished male in no acute distress. Ortho Exam Left shoulder good range of motion except for extremes of internal rotation.  Lift off test is positive on the left negative on the right.   Empty can test is positive on the left negative on the right.  Slight weakness with external rotation of the left against resistance otherwise bilateral shoulders full strength.  Passive range of motion of the left shoulder with internal and external rotation does reproduce a both audible and palpable click. Specialty Comments:  No specialty comments available.  Imaging: No results found.   PMFS History: Patient Active Problem List   Diagnosis Date Noted   Status post total replacement of right hip 02/28/2019   Unilateral primary osteoarthritis, right hip 01/16/2019   Chest pain 10/27/2017   Bronchitis due to tobacco use 10/27/2017   Psoriatic arthritis ([place]) 12/11/2013   Hyperuricemia 12/11/2013   HTN (hypertension), benign 10/22/2012   Hyperglycemia 10/22/2012   Hyperlipemia 10/22/2012   Allergic rhinitis 05/21/2012   GERD 05/27/2009   ABDOMINAL PAIN, RIGHT UPPER QUADRANT 05/27/2009   Past Medical History:  Diagnosis Date   Cancer ([place])    melanoma on the back was removed 10/2018   GERD (gastroesophageal reflux disease)    Gout    Hyperlipemia    Hypertension    Psoriatic arthritis ([place]) 12/11/2013   Dr Amil Amen     Family History  Problem Relation Age of Onset   Heart disease Father    Hypertension Father    Leukemia Father    Anesthesia problems Neg Hx    Hypotension  Neg Hx    Malignant hyperthermia Neg Hx    Pseudochol deficiency Neg Hx     Past Surgical History:  Procedure Laterality Date   BACK SURGERY  2003   L4,5,S1 Fusion   CHOLECYSTECTOMY     COLONOSCOPY N/A 12/29/2016   Procedure: COLONOSCOPY;  Surgeon: Daneil Dolin, MD;  [facility];  Service: Endoscopy;  Laterality: N/A;  9:15 AM   LUMBAR FUSION     3 yrs ago-[place]   TOTAL HIP ARTHROPLASTY Right 02/15/2019   Procedure: RIGHT TOTAL HIP ARTHROPLASTY ANTERIOR APPROACH;  Surgeon: Mcarthur Rossetti, MD;  Location[facility];  Service: Orthopedics;   Laterality: Right;   Social History   Occupational History   Not on file  Tobacco Use   Smoking status: Current Every Day Smoker    Packs/day: 0.50    Years: 20.00    Pack years: 10.00    Types: Cigarettes   Smokeless tobacco: Never Used  Vaping Use   Vaping Use: Never used  Substance and Sexual Activity   Alcohol use: No   Drug use: No   Sexual activity: Yes    Birth control/protection: None

## 2019-09-06 ENCOUNTER — Other Ambulatory Visit: Payer: Self-pay | Admitting: Radiology

## 2019-09-06 DIAGNOSIS — M25512 Pain in left shoulder: Secondary | ICD-10-CM

## 2019-09-16 ENCOUNTER — Other Ambulatory Visit: Payer: Commercial Managed Care - PPO

## 2019-09-16 ENCOUNTER — Ambulatory Visit
Admission: RE | Admit: 2019-09-16 | Discharge: 2019-09-16 | Disposition: A | Discharge status: Home / Self Care | Payer: Commercial Managed Care - PPO | Source: Ambulatory Visit | Attending: Physician Assistant | Admitting: Physician Assistant

## 2019-09-16 ENCOUNTER — Other Ambulatory Visit: Payer: Self-pay

## 2019-09-16 DIAGNOSIS — M25512 Pain in left shoulder: Secondary | ICD-10-CM

## 2019-09-25 ENCOUNTER — Encounter: Payer: Self-pay | Admitting: Orthopaedic Surgery

## 2019-09-25 ENCOUNTER — Ambulatory Visit (INDEPENDENT_AMBULATORY_CARE_PROVIDER_SITE_OTHER): Payer: Commercial Managed Care - PPO | Admitting: Orthopaedic Surgery

## 2019-09-25 DIAGNOSIS — M75122 Complete rotator cuff tear or rupture of left shoulder, not specified as traumatic: Secondary | ICD-10-CM | POA: Diagnosis not present

## 2019-09-25 MED ORDER — TRAMADOL HCL 50 MG PO TABS: 50.0000 mg | ORAL_TABLET | Freq: Four times a day (QID) | ORAL | 0 refills | Status: DC | PRN

## 2019-09-25 NOTE — Progress Notes (Signed)
Office Visit Note   Patient: Brian Brewer           Date of Birth: Nov 12, 1965           MRN: [id number] Visit Date: 09/25/2019              Requested by: Kathyrn Drown, MD [place],  [place] [id number] PCP: Kathyrn Drown, MD   Assessment & Plan: Visit Diagnoses:  1. Complete tear of left rotator cuff, unspecified whether traumatic     Plan: After reviewing the MRI findings and discussing these with the patient at length he would like to proceed with left shoulder arthroscopy with arthroscopic debridement and possible rotator cuff repair.  He understands that there is a chance that the cuff could not be repaired.  Discussed the postop protocol with him.  Questions were encouraged and answered by Dr. Ninfa Linden myself.  We will proceed with he surgical procedure in the near future.  He will follow up with [place] 1 week postop.  Follow-Up Instructions: Return 1 week post op.   Orders:  No orders of the defined types were placed in this encounter.  Meds ordered this encounter  Medications  . traMADol (ULTRAM) 50 MG tablet    Sig: Take 1 tablet (50 mg total) by mouth every 6 (six) hours as needed.    Dispense:  30 tablet    Refill:  0      Procedures: No procedures performed   Clinical Data: No additional findings.   Subjective: Chief Complaint  Patient presents with  . Left Shoulder - Follow-up    HPI [name] returns today to go over the MRI of his left shoulder.  Again he has had a subacromial injection in the shoulder that only gave him relief for about 2 weeks.  He states overall the pain in his shoulders remain unchanged.  He is also tried home exercise program. MRI left shoulder is reviewed with the patient images and a shoulder model is used for visualization. MRI shows small full-thickness retracted tear involving the supraspinatus.  Also significant tendinopathy of the rotator cuff with interstitial tears involving the supraspinatus and  infraspinatus tendons.  Biceps tendon the labrum are intact.  Tight 1 acromium.  Glenohumeral joint no significant arthritic changes.  Review of Systems See HPI otherwise negative.  Objective: Vital Signs: There were no vitals taken for this visit.  Physical Exam Constitutional:      Appearance: He is not ill-appearing or diaphoretic.  Pulmonary:     Effort: Pulmonary effort is normal.  Neurological:     Mental Status: He is alert and oriented to person, place, and time.  Psychiatric:        Mood and Affect: Mood normal.     Ortho Exam  Specialty Comments:  No specialty comments available.  Imaging: No results found.   PMFS History: Patient Active Problem List   Diagnosis Date Noted  . 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 Unilateral primary osteoarthritis, right hip 01/16/2019  . Chest pain 10/27/2017  . Bronchitis due to tobacco use 10/27/2017  . Psoriatic arthritis ([place]) 12/11/2013  . Hyperuricemia 12/11/2013  . HTN (hypertension), benign 10/22/2012  . Hyperglycemia 10/22/2012  . Hyperlipemia 10/22/2012  . Allergic rhinitis 05/21/2012  . GERD 05/27/2009  . ABDOMINAL PAIN, RIGHT UPPER QUADRANT 05/27/2009   Past Medical History:  Diagnosis Date  . Cancer ([place])    melanoma on the back was removed  10/2018  . GERD (gastroesophageal reflux disease)   . Gout   . Hyperlipemia   . Hypertension   . Psoriatic arthritis ([place]) 12/11/2013   Dr Amil Amen     Family History  Problem Relation Age of Onset  . Heart disease Father   . Hypertension Father   . Leukemia Father   . Anesthesia problems Neg Hx   . Hypotension Neg Hx   . Malignant hyperthermia Neg Hx   . Pseudochol deficiency Neg Hx     Past Surgical History:  Procedure Laterality Date  . BACK SURGERY  2003   L4,5,S1 Fusion  . CHOLECYSTECTOMY    . COLONOSCOPY N/A 12/29/2016   Procedure: COLONOSCOPY;  Surgeon: Daneil Dolin, MD;  Location: [facility] SUITE;  Service: Endoscopy;   Laterality: N/A;  9:15 AM  . LUMBAR FUSION     3 yrs ago-[place]  . TOTAL HIP ARTHROPLASTY Right 02/15/2019   Procedure: RIGHT TOTAL HIP ARTHROPLASTY ANTERIOR APPROACH;  Surgeon: Mcarthur Rossetti, MD;  Location: [facility];  Service: Orthopedics;  Laterality: Right;   Social History   Occupational History  . Not on file  Tobacco Use  . Smoking status: Current Every Day Smoker    Packs/day: 0.50    Years: 20.00    Pack years: 10.00    Types: Cigarettes  . Smokeless tobacco: Never Used  Vaping Use  . Vaping Use: Never used  Substance and Sexual Activity  . Alcohol use: No  . Drug use: No  . Sexual activity: Yes    Birth control/protection: None

## 2019-10-09 ENCOUNTER — Encounter: Payer: Self-pay | Admitting: Family Medicine

## 2019-10-09 ENCOUNTER — Other Ambulatory Visit: Payer: Self-pay

## 2019-10-09 ENCOUNTER — Ambulatory Visit (INDEPENDENT_AMBULATORY_CARE_PROVIDER_SITE_OTHER): Payer: Commercial Managed Care - PPO | Admitting: Family Medicine

## 2019-10-09 VITALS — BP 142/90 | HR 80 | Temp 97.5°F | Ht 70.0 in | Wt 207.0 lb

## 2019-10-09 DIAGNOSIS — Z77098 Contact with and (suspected) exposure to other hazardous, chiefly nonmedicinal, chemicals: Secondary | ICD-10-CM | POA: Diagnosis not present

## 2019-10-09 MED ORDER — ALBUTEROL SULFATE HFA 108 (90 BASE) MCG/ACT IN AERS: 2.0000 | INHALATION_SPRAY | RESPIRATORY_TRACT | 2 refills | Status: DC | PRN

## 2019-10-09 MED ORDER — PREDNISONE 20 MG PO TABS: ORAL_TABLET | ORAL | 0 refills | Status: DC

## 2019-10-09 NOTE — Patient Instructions (Signed)
Use albuterol inhaler every 4 hours as needed for heaviness of breathing. Take Prednisone 40 mg by mouth for 5 days. If you develop shortness of breath go to nearest ED.

## 2019-10-09 NOTE — Progress Notes (Signed)
Patient ID: Brian Brewer, male    DOB: 06-Sep-1965, 54 y.o.   MRN: [id number]   Chief Complaint  Patient presents with  . Cough   Subjective:    HPIwas painting 2 days ago and the next day woke up with cough and pain when taking a deep breath. Some sob when walking yesterday.    Medical History Brian Brewer has a past medical history of Cancer ([place]), GERD (gastroesophageal reflux disease), Gout, Hyperlipemia, Hypertension, and Psoriatic arthritis ([place]) (12/11/2013).   Outpatient Encounter Medications as of 10/09/2019  Medication Sig  . allopurinol (ZYLOPRIM) 100 MG tablet TAKE ONE TABLET BY MOUTH ONCE DAILY.  Marland Kitchen aspirin (ASPIRIN CHILDRENS) 81 MG chewable tablet Chew 1 tablet (81 mg total) by mouth 2 (two) times daily after a meal.  . esomeprazole (NEXIUM) 40 MG capsule Take 1 capsule (40 mg total) by mouth daily.  Marland Kitchen HUMIRA PEN 40 MG/0.4ML PNKT Inject 40 mg into the skin every 14 (fourteen) days.   Marland Kitchen lisinopril-hydrochlorothiazide (ZESTORETIC) 10-12.5 MG tablet Take 1 tablet by mouth daily.  . niacin 500 MG tablet Take 500 mg by mouth daily.  . pravastatin (PRAVACHOL) 40 MG tablet Take 1 tablet (40 mg total) by mouth every evening.  Marland Kitchen albuterol (VENTOLIN HFA) 108 (90 Base) MCG/ACT inhaler Inhale 2 puffs into the lungs every 4 (four) hours as needed for shortness of breath.  . predniSONE (DELTASONE) 20 MG tablet Take two tablets once daily for 5 days.  . [DISCONTINUED] Azelastine-Fluticasone 137-50 MCG/ACT SUSP SMARTSIG:1 Spray(s) Both Nares Twice Daily PRN  . [DISCONTINUED] benzonatate (TESSALON) 200 MG capsule Take 200 mg by mouth 3 (three) times daily as needed.  . [DISCONTINUED] cetirizine (ZYRTEC) 10 MG tablet Take 10 mg by mouth at bedtime.  . [DISCONTINUED] traMADol (ULTRAM) 50 MG tablet Take 1 tablet (50 mg total) by mouth every 6 (six) hours as needed.   No facility-administered encounter medications on file as of 10/09/2019.     Review of Systems  Constitutional: Negative for  chills, fatigue and fever.  HENT: Negative for congestion.   Respiratory: Positive for shortness of breath. Negative for cough, chest tightness and wheezing.        Describes as "heaviness" with deep inspiration.  Cardiovascular: Negative for chest pain.  Gastrointestinal: Negative.   Genitourinary: Negative.   Musculoskeletal: Negative.   Skin: Negative.   Neurological: Negative for dizziness and light-headedness.  Psychiatric/Behavioral: Negative.      Vitals BP (!) 142/90   Pulse 80   Temp (!) 97.5 F (36.4 C)   Ht 5\' 10"  (1.778 m)   Wt 207 lb (93.9 kg)   SpO2 98%   BMI 29.70 kg/m   Objective:   Physical Exam Vitals and nursing note reviewed.  Constitutional:      Appearance: Normal appearance.  Cardiovascular:     Rate and Rhythm: Normal rate and regular rhythm.     Heart sounds: Normal heart sounds.  Pulmonary:     Effort: Pulmonary effort is normal.     Breath sounds: Normal breath sounds.     Comments: Reports "heavy" feeling with deep inspiration. Lungs clear. No distress noted.  Skin:    General: Skin is warm and dry.  Neurological:     General: No focal deficit present.     Mental Status: He is alert and oriented to person, place, and time.  Psychiatric:        Mood and Affect: Mood normal.        Behavior: Behavior  normal.      Assessment and Plan   1. Exposure to chemical irritant - albuterol (VENTOLIN HFA) 108 (90 Base) MCG/ACT inhaler; Inhale 2 puffs into the lungs every 4 (four) hours as needed for shortness of breath.  Dispense: 8 g; Refill: 2 - predniSONE (DELTASONE) 20 MG tablet; Take two tablets once daily for 5 days.  Dispense: 10 tablet; Refill: 0   Brian Brewer presents today after spray painting his garage on Monday without wearing a mask. Tuesday he felt a heaviness with deep inspiration. He had an old inhaler from a previous illness and tried that without much improvement.  His oxygen saturation in office today is 98% and he does not  look like he is having any difficulties with breathing. He is able to converse with me without issue. Lungs are clear in all lung fields.   Consulted with Dr. Sallee Lange for plan of care as follows:   He will take prednisone 40 mg by mouth for 5 days and use albuterol inhaler every 4 hours as needed. He will seek a higher level of care if his condition changes. He understands this.  Agrees with plan of care discussed today. Understands warning signs to seek further care: increased shortness of breath and worsening symptoms in general. Understands to follow-up if symptoms do not improve or if anything changes.   Pecolia Ades, FNP-C   10/09/2019

## 2019-10-11 ENCOUNTER — Ambulatory Visit: Payer: Commercial Managed Care - PPO | Admitting: Family Medicine

## 2019-10-17 ENCOUNTER — Telehealth: Payer: Self-pay | Admitting: Orthopaedic Surgery

## 2019-10-17 ENCOUNTER — Other Ambulatory Visit: Payer: Self-pay | Admitting: Orthopaedic Surgery

## 2019-10-17 DIAGNOSIS — M75112 Incomplete rotator cuff tear or rupture of left shoulder, not specified as traumatic: Secondary | ICD-10-CM

## 2019-10-17 DIAGNOSIS — M7502 Adhesive capsulitis of left shoulder: Secondary | ICD-10-CM | POA: Diagnosis not present

## 2019-10-17 DIAGNOSIS — M7542 Impingement syndrome of left shoulder: Secondary | ICD-10-CM

## 2019-10-17 MED ORDER — OXYCODONE HCL 5 MG PO TABS: 5.0000 mg | ORAL_TABLET | ORAL | 0 refills | Status: DC | PRN

## 2019-10-17 MED ORDER — ONDANSETRON 4 MG PO TBDP: 4.0000 mg | ORAL_TABLET | Freq: Three times a day (TID) | ORAL | 0 refills | Status: DC | PRN

## 2019-10-17 MED ORDER — KETOROLAC TROMETHAMINE 10 MG PO TABS: 10.0000 mg | ORAL_TABLET | Freq: Four times a day (QID) | ORAL | 0 refills | Status: DC | PRN

## 2019-10-17 NOTE — Telephone Encounter (Signed)
Spoke with Anderson Malta at Pharmacy she did see the oxycodone order. Will work on filling Rx. Called and notified patient.

## 2019-10-17 NOTE — Telephone Encounter (Signed)
Patient called stating that the pharmacy receive 2 of prescriptions but no the oxycodone. Patient lives 30 mins away from pharmacy and is asking for nurse to call in script to be filled and patient will wait for it. Patient phone number is [phone].

## 2019-10-24 ENCOUNTER — Encounter: Payer: Self-pay | Admitting: Physician Assistant

## 2019-10-24 ENCOUNTER — Ambulatory Visit (INDEPENDENT_AMBULATORY_CARE_PROVIDER_SITE_OTHER): Payer: Commercial Managed Care - PPO | Admitting: Physician Assistant

## 2019-10-24 DIAGNOSIS — Z9889 Other specified postprocedural states: Secondary | ICD-10-CM

## 2019-10-24 NOTE — Progress Notes (Signed)
HPI: [name] returns today 1 week status post left shoulder arthroscopy with subacromial decompression and extensive debridement.  He states overall he is having no significant pain in the shoulder.  States he has had improvement in his range of motion prior to what he had for surgery.  Said no fevers chills.  He is taking Tylenol for discomfort.   Physical exam: Left shoulder port sites well-healed no signs of infection.  He does have significant bruising in his biceps region.  Full extension flexion of the left elbow.  Full motor sensation left hand.  Impression: Status post left shoulder arthroscopy 10/07/2019  Plan: He will work on forward flexion exercises, wall crawls and pendulum exercises as shown today.  Follow-up with [place] in a month.  Sutures removed.  Scar tissue mobilization encouraged.

## 2019-10-28 ENCOUNTER — Ambulatory Visit: Payer: Commercial Managed Care - PPO | Admitting: Orthopaedic Surgery

## 2019-11-21 ENCOUNTER — Encounter: Payer: Self-pay | Admitting: Physician Assistant

## 2019-11-21 ENCOUNTER — Ambulatory Visit (INDEPENDENT_AMBULATORY_CARE_PROVIDER_SITE_OTHER): Payer: Commercial Managed Care - PPO | Admitting: Physician Assistant

## 2019-11-21 DIAGNOSIS — M25512 Pain in left shoulder: Secondary | ICD-10-CM | POA: Diagnosis not present

## 2019-11-21 DIAGNOSIS — Z9889 Other specified postprocedural states: Secondary | ICD-10-CM

## 2019-11-21 MED ORDER — LIDOCAINE HCL 1 % IJ SOLN
3.0000 mL | INTRAMUSCULAR | Status: AC | PRN
  Administered 2019-11-21: 3 mL

## 2019-11-21 MED ORDER — METHYLPREDNISOLONE ACETATE 40 MG/ML IJ SUSP
40.0000 mg | INTRAMUSCULAR | Status: AC | PRN
  Administered 2019-11-21: 40 mg via INTRA_ARTICULAR

## 2019-11-21 NOTE — Progress Notes (Signed)
   Procedure Note  Patient: Brian Brewer             Date of Birth: November 26, 1965           MRN: [id number]             Visit Date: 11/21/2019  HPI: Mr. Alms returns today status post left shoulder scope with extensive debridement and subacromial decompression. He states he still have some soreness with certain movements particularly abduction and. He is working on his range of motion of the shoulder and doing the home exercises he was shown. He states the shoulders pain is improved from what he had prior to surgery and his movement definitely is improved. But he is having difficulty sleeping due to the pain in shoulder.  Physical exam: Left shoulder good range of motion. Limited internal rotation. Impingement testing is negative. Port sites are well-healed.  Procedures: Visit Diagnoses:  1. S/P arthroscopy of left shoulder   2. Left shoulder pain, unspecified chronicity     Large Joint Inj: L subacromial bursa on 11/21/2019 4:51 PM Indications: pain Details: 22 G 1.5 in needle, superior approach  Arthrogram: No  Medications: 3 mL lidocaine 1 %; 40 mg methylPREDNISolone acetate 40 MG/ML Outcome: tolerated well, no immediate complications Procedure, treatment alternatives, risks and benefits explained, specific risks discussed. Consent was given by the patient. Immediately prior to procedure a time out was called to verify the correct patient, procedure, equipment, support staff and site/side marked as required. Patient was prepped and draped in the usual sterile fashion.     Plan: Discussed with him going to formal physical therapy defers. Therefore offered subacromial injection in the shoulder which he agreed to and tolerated well. We will have him follow-up with Dr. Ninfa Linden in 4 weeks see what type of response he had to the injection. Questions were encouraged and answered.

## 2019-12-19 ENCOUNTER — Ambulatory Visit: Payer: Commercial Managed Care - PPO | Admitting: Orthopaedic Surgery

## 2019-12-25 LAB — BASIC METABOLIC PANEL
BUN/Creatinine Ratio: 10 (ref 9–20)
BUN: 12 mg/dL (ref 6–24)
CO2: 24 mmol/L (ref 20–29)
Calcium: 10 mg/dL (ref 8.7–10.2)
Chloride: 101 mmol/L (ref 96–106)
Creatinine, Ser: 1.17 mg/dL (ref 0.76–1.27)
GFR calc Af Amer: 82 mL/min/{1.73_m2} (ref >59)
GFR calc non Af Amer: 71 mL/min/{1.73_m2} (ref >59)
Glucose: 110 mg/dL — ABNORMAL HIGH (ref 65–99)
Potassium: 4.3 mmol/L (ref 3.5–5.2)
Sodium: 139 mmol/L (ref 134–144)

## 2019-12-25 LAB — LIPID PANEL
Chol/HDL Ratio: 5.4 ratio — ABNORMAL HIGH (ref 0.0–5.0)
Cholesterol, Total: 151 mg/dL (ref 100–199)
HDL: 28 mg/dL — ABNORMAL LOW (ref >39)
LDL Chol Calc (NIH): 93 mg/dL (ref 0–99)
Triglycerides: 173 mg/dL — ABNORMAL HIGH (ref 0–149)
VLDL Cholesterol Cal: 30 mg/dL (ref 5–40)

## 2019-12-25 LAB — URIC ACID: Uric Acid: 4.8 mg/dL (ref 3.8–8.4)

## 2019-12-25 LAB — PSA: Prostate Specific Ag, Serum: 0.5 ng/mL (ref 0.0–4.0)

## 2019-12-31 ENCOUNTER — Encounter: Payer: Self-pay | Admitting: Family Medicine

## 2019-12-31 ENCOUNTER — Other Ambulatory Visit: Payer: Self-pay

## 2019-12-31 ENCOUNTER — Ambulatory Visit (INDEPENDENT_AMBULATORY_CARE_PROVIDER_SITE_OTHER): Payer: Commercial Managed Care - PPO | Admitting: Family Medicine

## 2019-12-31 VITALS — BP 136/78 | Temp 98.0°F | Ht 69.0 in | Wt 204.8 lb

## 2019-12-31 DIAGNOSIS — Z0000 Encounter for general adult medical examination without abnormal findings: Secondary | ICD-10-CM

## 2019-12-31 DIAGNOSIS — L4050 Arthropathic psoriasis, unspecified: Secondary | ICD-10-CM | POA: Diagnosis not present

## 2019-12-31 DIAGNOSIS — I10 Essential (primary) hypertension: Secondary | ICD-10-CM

## 2019-12-31 DIAGNOSIS — E7849 Other hyperlipidemia: Secondary | ICD-10-CM

## 2019-12-31 DIAGNOSIS — E790 Hyperuricemia without signs of inflammatory arthritis and tophaceous disease: Secondary | ICD-10-CM

## 2019-12-31 DIAGNOSIS — R7301 Impaired fasting glucose: Secondary | ICD-10-CM

## 2019-12-31 NOTE — Progress Notes (Addendum)
Subjective:    Patient ID: Brian Brewer, male    DOB: Jun 03, 1965, 54 y.o.   MRN: [id number]  HPI  The patient comes in today for a wellness visit. Patient is overall energy level doing fairly good He still smokes but he is now using a nicotine liquid through inhaler.  Is really cut back on smoking.  No chest pain shortness of breath Patient does have underlying arthritic issues had back surgery earlier this year Had hip surgery    A review of their health history was completed.  A review of med  Any needed refills; yes  Eating habits: trying to do good  Falls/  MVA accidents in past few months: none  Regular exercise: a lot of walking at work and home  Specialist pt sees on regular basis: rheumatologist   Preventative health issues were discussed.   Additional concerns: none  Review of Systems  Constitutional: Negative for activity change, appetite change and fever.  HENT: Negative for congestion and rhinorrhea.   Eyes: Negative for discharge.  Respiratory: Negative for cough and wheezing.   Cardiovascular: Negative for chest pain.  Gastrointestinal: Negative for abdominal pain, blood in stool and vomiting.  Genitourinary: Negative for difficulty urinating and frequency.  Musculoskeletal: Negative for neck pain.  Skin: Negative for rash.  Allergic/Immunologic: Negative for environmental allergies and food allergies.  Neurological: Negative for weakness and headaches.  Psychiatric/Behavioral: Negative for agitation.       Objective:   Physical Exam Constitutional:      Appearance: He is well-developed.  HENT:     Head: Normocephalic and atraumatic.     Right Ear: External ear normal.     Left Ear: External ear normal.     Nose: Nose normal.  Eyes:     Pupils: Pupils are equal, round, and reactive to light.  Neck:     Thyroid: No thyromegaly.  Cardiovascular:     Rate and Rhythm: Normal rate and regular rhythm.     Heart sounds: Normal heart sounds. No  murmur heard.   Pulmonary:     Effort: Pulmonary effort is normal. No respiratory distress.     Breath sounds: Normal breath sounds. No wheezing.  Abdominal:     General: Bowel sounds are normal. There is no distension.     Palpations: Abdomen is soft. There is no mass.     Tenderness: There is no abdominal tenderness.  Genitourinary:    Penis: Normal.   Musculoskeletal:        General: Normal range of motion.     Cervical back: Normal range of motion and neck supple.  Lymphadenopathy:     Cervical: No cervical adenopathy.  Skin:    General: Skin is warm and dry.     Findings: No erythema.  Neurological:     Mental Status: He is alert.     Motor: No abnormal muscle tone.  Psychiatric:        Behavior: Behavior normal.        Judgment: Judgment normal.    Prostate exam normal Labs reviewed Results for orders placed or performed in visit on 04/20/20  Basic metabolic panel  Result Value Ref Range   Glucose 110 (H) 65 - 99 mg/dL   BUN 12 6 - 24 mg/dL   Creatinine, Ser 1.17 0.76 - 1.27 mg/dL   GFR calc non Af Amer 71 >59 mL/min/1.73   GFR calc Af Amer 82 >59 mL/min/1.73   BUN/Creatinine Ratio 10 9 - 20  Sodium 139 134 - 144 mmol/L   Potassium 4.3 3.5 - 5.2 mmol/L   Chloride 101 96 - 106 mmol/L   CO2 24 20 - 29 mmol/L   Calcium 10.0 8.7 - 10.2 mg/dL  Lipid panel  Result Value Ref Range   Cholesterol, Total 151 100 - 199 mg/dL   Triglycerides 173 (H) 0 - 149 mg/dL   HDL 28 (L) >39 mg/dL   VLDL Cholesterol Cal 30 5 - 40 mg/dL   LDL Chol Calc (NIH) 93 0 - 99 mg/dL   Chol/HDL Ratio 5.4 (H) 0.0 - 5.0 ratio  Uric acid  Result Value Ref Range   Uric Acid 4.8 3.8 - 8.4 mg/dL  PSA  Result Value Ref Range   Prostate Specific Ag, Serum 0.5 0.0 - 4.0 ng/mL          Assessment & Plan:  1. HTN (hypertension), benign Blood pressure good control continue current medications watch diet  2. Psoriatic arthritis ([place]) Continue with rheumatologist.  3. Other  hyperlipidemia Continue medication watch diet stay active  4. Well adult exam Adult wellness-complete.wellness physical was conducted today. Importance of diet and exercise were discussed in detail.  In addition to this a discussion regarding safety was also covered. We also reviewed over immunizations and gave recommendations regarding current immunization needed for age.  In addition to this additional areas were also touched on including: Preventative health exams needed:  Colonoscopy not due till 2028 Uric acid under good control continue allopurinol Patient was advised yearly wellness exam Patient was advised to quit smoking totally.  If he does have to utilize nicotine inhalers that is better than smoking ideally it would be best to be off of all  5. Fasting hyperglycemia Minimize starches in diet stay active keep weight down Follow-up by spring time

## 2020-02-27 ENCOUNTER — Other Ambulatory Visit: Payer: Self-pay | Admitting: Family Medicine

## 2020-03-16 ENCOUNTER — Ambulatory Visit
Admission: RE | Admit: 2020-03-16 | Discharge: 2020-03-16 | Disposition: A | Discharge status: Home / Self Care | Payer: Commercial Managed Care - PPO | Source: Ambulatory Visit | Attending: Family Medicine | Admitting: Family Medicine

## 2020-03-16 ENCOUNTER — Other Ambulatory Visit: Payer: Self-pay

## 2020-03-16 VITALS — BP 167/99 | HR 84 | Temp 98.2°F | Resp 18

## 2020-03-16 DIAGNOSIS — Z1152 Encounter for screening for COVID-19: Secondary | ICD-10-CM

## 2020-03-16 DIAGNOSIS — J209 Acute bronchitis, unspecified: Secondary | ICD-10-CM

## 2020-03-16 MED ORDER — AEROCHAMBER PLUS FLO-VU MEDIUM MISC
1.0000 | Freq: Once | Status: AC
  Administered 2020-03-16: 1

## 2020-03-16 MED ORDER — ALBUTEROL SULFATE HFA 108 (90 BASE) MCG/ACT IN AERS
2.0000 | INHALATION_SPRAY | Freq: Once | RESPIRATORY_TRACT | Status: AC
  Administered 2020-03-16: 2 via RESPIRATORY_TRACT

## 2020-03-16 MED ORDER — DOXYCYCLINE HYCLATE 100 MG PO CAPS: 100.0000 mg | ORAL_CAPSULE | Freq: Two times a day (BID) | ORAL | 0 refills | Status: AC

## 2020-03-16 MED ORDER — PREDNISONE 20 MG PO TABS: 40.0000 mg | ORAL_TABLET | Freq: Every day | ORAL | 0 refills | Status: DC

## 2020-03-16 MED ORDER — PROMETHAZINE-DM 6.25-15 MG/5ML PO SYRP
5.0000 mL | ORAL_SOLUTION | Freq: Four times a day (QID) | ORAL | 0 refills | Status: DC | PRN
Start: 2020-03-16 — End: 2020-06-30

## 2020-03-16 NOTE — ED Triage Notes (Signed)
Sinus pressure and drainage that started Wednesday. Now feels like he has congestion in chest.  Pt seen pcp on Friday and was given rx for amoxicillin and has been taking.  Had neg pcr covid test on Wednesday and neg home test Friday.

## 2020-03-16 NOTE — ED Provider Notes (Signed)
[facility]    CSN: [id number] Arrival date & time: 03/16/20  1047      History   Chief Complaint No chief complaint on file.   HPI Brian Brewer is a 55 y.o. male.   HPI  Patient presents today concern of cough, nasal congestion, chest congestion, mild shortness of breath and chest tenderness with deep breathing.  Patient had 2 - Covid test last week.  Patient was started on amoxicillin by a provider last week and he reports symptoms have since worsened Estinyl he is having a persistent cough with fatigue.  Denies any known measurable fever.  Denies history of recurrent bronchitis.  He is smoker.  Past Medical History:  Diagnosis Date  . Cancer ([place])    melanoma on the back was removed 10/2018  . GERD (gastroesophageal reflux disease)   . Gout   . Hyperlipemia   . Hypertension   . Psoriatic arthritis ([place]) 12/11/2013   Dr Amil Amen     Patient Active Problem List   Diagnosis Date Noted  . Exposure to chemical irritant 10/09/2019  . 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 Unilateral primary osteoarthritis, right hip 01/16/2019  . Chest pain 10/27/2017  . Bronchitis due to tobacco use 10/27/2017  . Psoriatic arthritis ([place]) 12/11/2013  . Hyperuricemia 12/11/2013  . HTN (hypertension), benign 10/22/2012  . Fasting hyperglycemia 10/22/2012  . Hyperlipemia 10/22/2012  . Allergic rhinitis 05/21/2012  . GERD 05/27/2009  . ABDOMINAL PAIN, RIGHT UPPER QUADRANT 05/27/2009    Past Surgical History:  Procedure Laterality Date  . BACK SURGERY  2003   L4,5,S1 Fusion  . CHOLECYSTECTOMY    . COLONOSCOPY N/A 12/29/2016   Procedure: COLONOSCOPY;  Surgeon: Daneil Dolin, MD;  [facility];  Service: Endoscopy;  Laterality: N/A;  9:15 AM  . LUMBAR FUSION     3 yrs [place]  . TOTAL HIP ARTHROPLASTY Right 02/15/2019   Procedure: RIGHT TOTAL HIP ARTHROPLASTY ANTERIOR APPROACH;  Surgeon: Mcarthur Rossetti, MD;  Location: [facility];   Service: Orthopedics;  Laterality: Right;       Home Medications    Prior to Admission medications   Medication Sig Start Date End Date Taking? Authorizing Provider  doxycycline (VIBRAMYCIN) 100 MG capsule Take 1 capsule (100 mg total) by mouth 2 (two) times daily for 7 days. 03/16/20 03/23/20 Yes Scot Jun, FNP  predniSONE (DELTASONE) 20 MG tablet Take 2 tablets (40 mg total) by mouth daily with breakfast. 03/16/20  Yes Scot Jun, FNP  promethazine-dextromethorphan (PROMETHAZINE-DM) 6.25-15 MG/5ML syrup Take 5 mLs by mouth 4 (four) times daily as needed for cough. 03/16/20  Yes Scot Jun, FNP  albuterol (VENTOLIN HFA) 108 (90 Base) MCG/ACT inhaler Inhale 2 puffs into the lungs every 4 (four) hours as needed for shortness of breath. 10/09/19   Chalmers Guest, NP  allopurinol (ZYLOPRIM) 100 MG tablet TAKE ONE TABLET BY MOUTH ONCE DAILY. 08/13/19   Kathyrn Drown, MD  allopurinol (ZYLOPRIM) 300 MG tablet Take 300 mg by mouth daily. Patient not taking: Reported on 12/31/2019 09/26/19   [provider]  esomeprazole (NEXIUM) 40 MG capsule TAKE ONE CAPSULE BY MOUTH ONCE DAILY. 02/28/20   Luking, Elayne Snare, MD  HUMIRA PEN 40 MG/0.4ML PNKT Inject 40 mg into the skin every 14 (fourteen) days.  01/29/19   [provider]  ketorolac (TORADOL) 10 MG tablet Take 1 tablet (10 mg total) by mouth every 6 (six) hours  as needed. Patient not taking: Reported on 12/31/2019 10/17/19   Mcarthur Rossetti, MD  lisinopril-hydrochlorothiazide (ZESTORETIC) 10-12.5 MG tablet TAKE ONE TABLET BY MOUTH ONCE DAILY. 02/28/20   Kathyrn Drown, MD  nystatin-triamcinolone (MYCOLOG II) cream SMARTSIG:Sparingly Topical Twice Daily 10/31/19   [provider]  ondansetron (ZOFRAN ODT) 4 MG disintegrating tablet Take 1 tablet (4 mg total) by mouth every 8 (eight) hours as needed for nausea or vomiting. 10/17/19   Mcarthur Rossetti, MD  oxyCODONE (ROXICODONE) 5 MG immediate  release tablet Take 1-2 tablets (5-10 mg total) by mouth every 4 (four) hours as needed for severe pain. Patient not taking: Reported on 12/31/2019 10/17/19   Mcarthur Rossetti, MD  pravastatin (PRAVACHOL) 40 MG tablet Take 1 tablet (40 mg total) by mouth every evening. 08/13/19   Kathyrn Drown, MD    Family History Family History  Problem Relation Age of Onset  . Heart disease Father   . Hypertension Father   . Leukemia Father   . Anesthesia problems Neg Hx   . Hypotension Neg Hx   . Malignant hyperthermia Neg Hx   . Pseudochol deficiency Neg Hx     Social History Social History   Tobacco Use  . Smoking status: Current Every Day Smoker    Packs/day: 0.50    Years: 20.00    Pack years: 10.00    Types: Cigarettes  . Smokeless tobacco: Never Used  Vaping Use  . Vaping Use: Never used  Substance Use Topics  . Alcohol use: No  . Drug use: No     Allergies   Amoxicillin-pot clavulanate, Levofloxacin, Zithromax [azithromycin], Chantix [varenicline tartrate], and Oxycodone   Review of Systems Review of Systems Pertinent negatives listed in HPI   Physical Exam Triage Vital Signs ED Triage Vitals  Enc Vitals Group     BP 03/16/20 1057 (!) 167/99     Pulse Rate 03/16/20 1057 84     Resp 03/16/20 1057 18     Temp 03/16/20 1057 98.2 F (36.8 C)     Temp Source 03/16/20 1057 Oral     SpO2 03/16/20 1057 98 %     Weight --      Height --      Head Circumference --      Peak Flow --      Pain Score 03/16/20 1101 0     Pain Loc --      Pain Edu? --      Excl. in [place]? --    No data found.  Updated Vital Signs BP (!) 167/99   Pulse 84   Temp 98.2 F (36.8 C) (Oral)   Resp 18   SpO2 98%   Visual Acuity Right Eye Distance:   Left Eye Distance:   Bilateral Distance:    Right Eye Near:   Left Eye Near:    Bilateral Near:     Physical Exam General appearance: alert, Ill-appearing, no distress Head: Normocephalic, without obvious abnormality,  atraumatic ENT: mucosal edema, rhinorrhea, oropharynx w/o exudate Respiratory: Respirations even , unlabored, coarse lung sound, expiratory wheeze Heart: rate and rhythm normal. No gallop or murmurs noted on exam  Extremities: No gross deformities Skin: Skin color, texture, turgor normal. No rashes seen  Psych: Appropriate mood and affect. Neurologic: GCS 15, normal gait and coordination    UC Treatments / Results  Labs (all labs ordered are listed, but only abnormal results are displayed) Labs Reviewed  COVID-19, FLU A+B NAA  EKG   Radiology No results found.  Procedures Procedures (including critical care time)  Medications Ordered in UC Medications  albuterol (VENTOLIN HFA) 108 (90 Base) MCG/ACT inhaler 2 puff (2 puffs Inhalation Given 03/16/20 1124)  AeroChamber Plus Flo-Vu Medium MISC 1 each (1 each Other Given 03/16/20 1124)    Initial Impression / Assessment and Plan / UC Course  I have reviewed the triage vital signs and the nursing notes.  Pertinent labs & imaging results that were available during my care of the patient were reviewed by me and considered in my medical decision making (see chart for details).    COVIDtest pending. Symptom management warranted only.  Manage fever with Tylenol and ibuprofen.   Treatment per discharge medications/discharge instructions.  Red flags/ER precautions given. The most current CDC isolation/quarantine recommendation advised.   Final Clinical Impressions(s) / UC Diagnoses   Final diagnoses:  Encounter for screening for COVID-19  Acute bronchitis, unspecified organism     Discharge Instructions     Your COVID 19 results should result within 3-5 days. Negative results are immediately resulted to Mychart. Only positive results will receive a follow-up call from our clinic. If symptoms are present, I recommend home quarantine until results are known.  Treating  you today for acute bronchitis.  Albuterol inhaler every  4-6 hours as needed for chest tightness, shortness of breath or wheezing.  Alternate Tylenol and ibuprofen as needed for body aches and fever.  Symptom management per recommendations discussed today.  If any breathing difficulty or chest pain develops go immediately to the closest emergency department for evaluation.    ED Prescriptions    Medication Sig Dispense Auth. Provider   predniSONE (DELTASONE) 20 MG tablet Take 2 tablets (40 mg total) by mouth daily with breakfast. 10 tablet Scot Jun, FNP   promethazine-dextromethorphan (PROMETHAZINE-DM) 6.25-15 MG/5ML syrup Take 5 mLs by mouth 4 (four) times daily as needed for cough. 140 mL Scot Jun, FNP   doxycycline (VIBRAMYCIN) 100 MG capsule Take 1 capsule (100 mg total) by mouth 2 (two) times daily for 7 days. 14 capsule Scot Jun, FNP     PDMP not reviewed this encounter.   Scot Jun, FNP 03/16/20 1325

## 2020-03-16 NOTE — Discharge Instructions (Addendum)
Your COVID 19 results should result within 3-5 days. Negative results are immediately resulted to Mychart. Only positive results will receive a follow-up call from our clinic. If symptoms are present, I recommend home quarantine until results are known.  Treating  you today for acute bronchitis.  Albuterol inhaler every 4-6 hours as needed for chest tightness, shortness of breath or wheezing.  Alternate Tylenol and ibuprofen as needed for body aches and fever.  Symptom management per recommendations discussed today.  If any breathing difficulty or chest pain develops go immediately to the closest emergency department for evaluation.

## 2020-03-19 LAB — COVID-19, FLU A+B NAA

## 2020-06-30 ENCOUNTER — Encounter: Payer: Self-pay | Admitting: Family Medicine

## 2020-06-30 ENCOUNTER — Ambulatory Visit: Payer: Commercial Managed Care - PPO | Admitting: Family Medicine

## 2020-06-30 ENCOUNTER — Other Ambulatory Visit: Payer: Self-pay

## 2020-06-30 VITALS — BP 129/83 | HR 79 | Temp 98.2°F | Ht 69.0 in | Wt 209.0 lb

## 2020-06-30 DIAGNOSIS — R7301 Impaired fasting glucose: Secondary | ICD-10-CM

## 2020-06-30 DIAGNOSIS — E790 Hyperuricemia without signs of inflammatory arthritis and tophaceous disease: Secondary | ICD-10-CM

## 2020-06-30 DIAGNOSIS — L4050 Arthropathic psoriasis, unspecified: Secondary | ICD-10-CM | POA: Diagnosis not present

## 2020-06-30 DIAGNOSIS — Z125 Encounter for screening for malignant neoplasm of prostate: Secondary | ICD-10-CM

## 2020-06-30 DIAGNOSIS — K219 Gastro-esophageal reflux disease without esophagitis: Secondary | ICD-10-CM

## 2020-06-30 DIAGNOSIS — I10 Essential (primary) hypertension: Secondary | ICD-10-CM | POA: Diagnosis not present

## 2020-06-30 DIAGNOSIS — E7849 Other hyperlipidemia: Secondary | ICD-10-CM

## 2020-06-30 MED ORDER — ESOMEPRAZOLE MAGNESIUM 40 MG PO CPDR: 40.0000 mg | DELAYED_RELEASE_CAPSULE | Freq: Every day | ORAL | 1 refills | Status: DC

## 2020-06-30 MED ORDER — ALLOPURINOL 100 MG PO TABS: ORAL_TABLET | ORAL | 1 refills | Status: DC

## 2020-06-30 MED ORDER — LISINOPRIL-HYDROCHLOROTHIAZIDE 10-12.5 MG PO TABS: 1.0000 | ORAL_TABLET | Freq: Every day | ORAL | 1 refills | Status: DC

## 2020-06-30 MED ORDER — PRAVASTATIN SODIUM 40 MG PO TABS: 40.0000 mg | ORAL_TABLET | Freq: Every evening | ORAL | 1 refills | Status: DC

## 2020-06-30 NOTE — Progress Notes (Signed)
   Subjective:    Patient ID: Brian Brewer, male    DOB: [birth year], 55 y.o.   MRN: [id number]  Hyperlipidemia This is a chronic problem. Pertinent negatives include no shortness of breath. Treatments tried: pravastatin. Compliance problems: takes med every day, walks alot, sorta healthy diet.    Pain on right side after picking up something heavy 3 days ago. Patient states increased pain with certain movements.  Denies any other particular troubles  No shortness of breath no chest pain  Still smoking has been counseled to quit   Review of Systems  Respiratory: Negative for cough, chest tightness and shortness of breath.   Gastrointestinal: Negative for blood in stool, diarrhea and nausea.  Musculoskeletal: Positive for arthralgias. Negative for back pain and gait problem.       Objective:   Physical Exam  Lungs clear heart regular pulse normal BP good subjective discomfort along the right lower back region with certain rotation and bending      Assessment & Plan:  1. HTN (hypertension), benign Blood pressure good control continue current medications minimize salt.  Encourage patient to quit smoking.  It is unlikely he will do so currently.  We did discuss patches and Nicorette gum. - Basic metabolic panel  2. Gastroesophageal reflux disease without esophagitis Does do well with medicine watch diet  3. Psoriatic arthritis ([place]) Sees specialist on a regular basis currently Humira not doing enough to control his symptoms.  He will discuss this with his specialist about the possibility of methotrexate added  4. Fasting hyperglycemia Because of fasting hyperglycemia check A1c await results he will do these labs late in the fall minimize starches in the diet - Hemoglobin A1c  5. Other hyperlipidemia Continue medication watch diet stay physically active check labs before follow-up visit for wellness - Lipid panel  6. Hyperuricemia Continue allopurinol check uric acid  level - Uric acid  7. Screening for prostate cancer Screening - PSA  Musculoskeletal pain and discomfort right lower back with certain movements stretching exercises if not resolved within the next 3 weeks to let us know  Recheck late this fall for a wellness

## 2020-08-21 ENCOUNTER — Other Ambulatory Visit: Payer: Self-pay

## 2020-08-21 ENCOUNTER — Ambulatory Visit
Admission: RE | Admit: 2020-08-21 | Discharge: 2020-08-21 | Disposition: A | Discharge status: Home / Self Care | Payer: Commercial Managed Care - PPO | Source: Ambulatory Visit | Attending: Emergency Medicine | Admitting: Emergency Medicine

## 2020-08-21 VITALS — BP 159/91 | HR 69 | Temp 98.3°F | Resp 16

## 2020-08-21 DIAGNOSIS — R21 Rash and other nonspecific skin eruption: Secondary | ICD-10-CM | POA: Diagnosis not present

## 2020-08-21 MED ORDER — MUPIROCIN CALCIUM 2 % EX CREA: 1.0000 "application " | TOPICAL_CREAM | Freq: Two times a day (BID) | CUTANEOUS | 0 refills | Status: DC

## 2020-08-21 NOTE — ED Provider Notes (Signed)
[place]   JP:[id number] 08/21/20 Arrival Time: 54  CC: Insect bite  SUBJECTIVE:  Brian Brewer is a 55 y.o. male who presents with possible insect bite to LT lower leg that occurred earlier this week.  States he was out in the garden prior to symptoms.  Localizes the rash to inside of LT lower leg.  Describes it as red and blistering, not painful, more just irritated with friction.  Has tried OTC medications without relief.  Symptoms are made worse with scratching.  Denies similar symptoms in the past.   Denies fever, chills, nausea, vomiting, discharge.  ROS: As per HPI.  All other pertinent ROS negative.     Past Medical History:  Diagnosis Date   Cancer ([place])    melanoma on the back was removed 10/2018   GERD (gastroesophageal reflux disease)    Gout    Hyperlipemia    Hypertension    Psoriatic arthritis ([place]) 12/11/2013   Dr Amil Amen    Past Surgical History:  Procedure Laterality Date   BACK SURGERY  2003   L4,5,S1 Fusion   CHOLECYSTECTOMY     COLONOSCOPY N/A 12/29/2016   Procedure: COLONOSCOPY;  Surgeon: Daneil Dolin, MD;  [facility];  Service: Endoscopy;  Laterality: N/A;  9:15 AM   LUMBAR FUSION     3 yrs ago-[place]   TOTAL HIP ARTHROPLASTY Right 02/15/2019   Procedure: RIGHT TOTAL HIP ARTHROPLASTY ANTERIOR APPROACH;  Surgeon: Mcarthur Rossetti, MD;  Location: [facility];  Service: Orthopedics;  Laterality: Right;   Allergies  Allergen Reactions   Amoxicillin-Pot Clavulanate Other (See Comments)    Cramps and diarrhea Has patient had a PCN reaction causing immediate rash, facial/tongue/throat swelling, SOB or lightheadedness with hypotension: No Has patient had a PCN reaction causing severe rash involving mucus membranes or skin necrosis: No Has patient had a PCN reaction that required hospitalization: No Has patient had a PCN reaction occurring within the last 10 years: No  If all of the above answers are "NO", then may proceed  with Cephalosporin use.   Levofloxacin Other (See Comments)    Passed out (possibly due to medication)   Zithromax [Azithromycin] Diarrhea   Chantix [Varenicline Tartrate] Nausea And Vomiting   Oxycodone Diarrhea, Itching and Anxiety   No current facility-administered medications on file prior to encounter.   Current Outpatient Medications on File Prior to Encounter  Medication Sig Dispense Refill   albuterol (VENTOLIN HFA) 108 (90 Base) MCG/ACT inhaler Inhale 2 puffs into the lungs every 4 (four) hours as needed for shortness of breath. 8 g 2   allopurinol (ZYLOPRIM) 100 MG tablet TAKE ONE TABLET BY MOUTH ONCE DAILY. 90 tablet 1   esomeprazole (NEXIUM) 40 MG capsule Take 1 capsule (40 mg total) by mouth daily. 90 capsule 1   HUMIRA PEN 40 MG/0.4ML PNKT Inject 40 mg into the skin every 14 (fourteen) days.      lisinopril-hydrochlorothiazide (ZESTORETIC) 10-12.5 MG tablet Take 1 tablet by mouth daily. 90 tablet 1   pravastatin (PRAVACHOL) 40 MG tablet Take 1 tablet (40 mg total) by mouth every evening. 90 tablet 1   Social History   Socioeconomic History   Marital status: Married    Spouse name: Not on file   Number of children: Not on file   Years of education: Not on file   Highest education level: Not on file  Occupational History   Not on file  Tobacco Use   Smoking status: Every  Day    Packs/day: 0.50    Years: 20.00    Pack years: 10.00    Types: Cigarettes   Smokeless tobacco: Never  Vaping Use   Vaping Use: Never used  Substance and Sexual Activity   Alcohol use: No   Drug use: No   Sexual activity: Yes    Birth control/protection: None  Other Topics Concern   Not on file  Social History Narrative   Not on file   Social Determinants of Health   Financial Resource Strain: Not on file  Food Insecurity: Not on file  Transportation Needs: Not on file  Physical Activity: Not on file  Stress: Not on file  Social Connections: Not on file  Intimate Partner  Violence: Not on file   Family History  Problem Relation Age of Onset   Heart disease Father    Hypertension Father    Leukemia Father    Anesthesia problems Neg Hx    Hypotension Neg Hx    Malignant hyperthermia Neg Hx    Pseudochol deficiency Neg Hx     OBJECTIVE: Vitals:   08/21/20 1329  BP: (!) 159/91  Pulse: 69  Resp: 16  Temp: 98.3 F (36.8 C)  TempSrc: Temporal  SpO2: 98%    General appearance: alert; no distress Head: NCAT Lungs: normal respiratory effort Extremities: no edema Skin: warm and dry; area of erythema with papules/ vesicles apx 1-2 cm in diameter to LT medial lower leg, NTTP, no drainage or bleeding Psychological: alert and cooperative; normal mood and affect  ASSESSMENT & PLAN:  1. Rash and nonspecific skin eruption    Meds ordered this encounter  Medications   mupirocin cream (BACTROBAN) 2 %    Sig: Apply 1 application topically 2 (two) times daily.    Dispense:  15 g    Refill:  0    Order Specific Question:   Supervising Provider    Answer:   Raylene Everts [id number]   Clean with warm water and mild soap Bactroban ointment prescribed.   Keep covered Continue with OTC hydrocortisone cream for itching Follow up with PCP for recheck Return or go to the ER if you have any new or worsening symptoms such as fever, chills, nausea, vomiting, redness, swelling, discharge, if symptoms do not improve with medications, etc...  Reviewed expectations re: course of current medical issues. Questions answered. Outlined signs and symptoms indicating need for more acute intervention. Patient verbalized understanding. After Visit Summary given.    Lestine Box, PA-C 08/21/20 1347

## 2020-08-21 NOTE — Discharge Instructions (Addendum)
Clean with warm water and mild soap Bactroban ointment prescribed.   Keep covered Continue with OTC hydrocortisone cream for itching Follow up with PCP for recheck Return or go to the ER if you have any new or worsening symptoms such as fever, chills, nausea, vomiting, redness, swelling, discharge, if symptoms do not improve with medications, etc..Marland Kitchen

## 2020-08-21 NOTE — ED Triage Notes (Signed)
Rash on bottom of left lower leg since Monday.

## 2020-11-27 ENCOUNTER — Other Ambulatory Visit: Payer: Self-pay | Admitting: Family Medicine

## 2020-11-27 NOTE — Telephone Encounter (Signed)
Sent my chart message to schedule appointment 11/27/20

## 2020-12-21 NOTE — Telephone Encounter (Signed)
Sent second request to schedule appointment

## 2020-12-30 ENCOUNTER — Telehealth: Payer: Self-pay | Admitting: Family Medicine

## 2020-12-30 MED ORDER — ESOMEPRAZOLE MAGNESIUM 40 MG PO CPDR: 40.0000 mg | DELAYED_RELEASE_CAPSULE | Freq: Every day | ORAL | 0 refills | Status: DC

## 2020-12-30 MED ORDER — PRAVASTATIN SODIUM 40 MG PO TABS: 40.0000 mg | ORAL_TABLET | Freq: Every evening | ORAL | 0 refills | Status: DC

## 2020-12-30 MED ORDER — ALLOPURINOL 100 MG PO TABS: ORAL_TABLET | ORAL | 0 refills | Status: DC

## 2020-12-30 MED ORDER — LISINOPRIL-HYDROCHLOROTHIAZIDE 10-12.5 MG PO TABS
1.0000 | ORAL_TABLET | Freq: Every day | ORAL | 0 refills | Status: DC
Start: 2020-12-30 — End: 2021-01-18

## 2020-12-30 NOTE — Telephone Encounter (Signed)
Patient is requesting refills on several medication has appointment on 12/19 but out of medications lisinopril 10/12.5 mg, pravastatin 40 mg,esomeprazole 40 mg, allopurinol 100 mg called into [place]

## 2020-12-30 NOTE — Telephone Encounter (Signed)
Patient informed prescriptions refilled and sent to pharmacy.

## 2021-01-18 ENCOUNTER — Encounter: Payer: Self-pay | Admitting: Family Medicine

## 2021-01-18 ENCOUNTER — Ambulatory Visit: Payer: Commercial Managed Care - PPO | Admitting: Family Medicine

## 2021-01-18 ENCOUNTER — Other Ambulatory Visit: Payer: Self-pay

## 2021-01-18 VITALS — BP 138/80 | Temp 97.9°F | Wt 212.8 lb

## 2021-01-18 DIAGNOSIS — E7849 Other hyperlipidemia: Secondary | ICD-10-CM

## 2021-01-18 DIAGNOSIS — I10 Essential (primary) hypertension: Secondary | ICD-10-CM | POA: Diagnosis not present

## 2021-01-18 DIAGNOSIS — Z23 Encounter for immunization: Secondary | ICD-10-CM | POA: Diagnosis not present

## 2021-01-18 DIAGNOSIS — Z125 Encounter for screening for malignant neoplasm of prostate: Secondary | ICD-10-CM | POA: Diagnosis not present

## 2021-01-18 DIAGNOSIS — R7301 Impaired fasting glucose: Secondary | ICD-10-CM | POA: Diagnosis not present

## 2021-01-18 MED ORDER — CHANTIX STARTING MONTH PAK 0.5 MG X 11 & 1 MG X 42 PO TBPK: 0.5000 mg | ORAL_TABLET | Freq: Every day | ORAL | 0 refills | Status: DC

## 2021-01-18 MED ORDER — PRAVASTATIN SODIUM 40 MG PO TABS: 40.0000 mg | ORAL_TABLET | Freq: Every evening | ORAL | 1 refills | Status: DC

## 2021-01-18 MED ORDER — VARENICLINE TARTRATE 1 MG PO TABS: ORAL_TABLET | ORAL | 3 refills | Status: DC

## 2021-01-18 MED ORDER — LISINOPRIL-HYDROCHLOROTHIAZIDE 10-12.5 MG PO TABS: 1.0000 | ORAL_TABLET | Freq: Every day | ORAL | 1 refills | Status: DC

## 2021-01-18 MED ORDER — ESOMEPRAZOLE MAGNESIUM 40 MG PO CPDR
40.0000 mg | DELAYED_RELEASE_CAPSULE | Freq: Every day | ORAL | 1 refills | Status: DC
Start: 2021-01-18 — End: 2021-08-23

## 2021-01-18 MED ORDER — ALLOPURINOL 100 MG PO TABS: ORAL_TABLET | ORAL | 1 refills | Status: DC

## 2021-01-18 NOTE — Progress Notes (Signed)
° °  Subjective:    Patient ID: Brian Brewer, male    DOB: 1965/12/22, 55 y.o.   MRN: [id number]  HPI Pt here for follow up. Pt states blood pressure has been OK. Checking blood pressure at work. Taking all meds as directed. No issues Taking his medication Follows with rheumatology on a regular basis Takes his cholesterol medicine watch his diet Is interested in trying to quit smoking  Review of Systems     Objective:   Physical Exam  General-in no acute distress Eyes-no discharge Lungs-respiratory rate normal, CTA CV-no murmurs,RRR Extremities skin warm dry no edema Neuro grossly normal Behavior normal, alert       Assessment & Plan:  We did discuss quitting smoking discussed Chantix he tried before and had some nausea with it he would like to try to get  Cholesterol recent lab work looks good continue medication watch diet  Allopurinol doing well continue current measures  Blood pressure doing well continue current measures  Follow-up again in 6 months time

## 2021-01-19 LAB — PSA: Prostate Specific Ag, Serum: 2.6 ng/mL (ref 0.0–4.0)

## 2021-01-19 LAB — LIPID PANEL
Chol/HDL Ratio: 4.3 ratio (ref 0.0–5.0)
Cholesterol, Total: 149 mg/dL (ref 100–199)
HDL: 35 mg/dL — ABNORMAL LOW (ref >39)
LDL Chol Calc (NIH): 80 mg/dL (ref 0–99)
Triglycerides: 204 mg/dL — ABNORMAL HIGH (ref 0–149)
VLDL Cholesterol Cal: 34 mg/dL (ref 5–40)

## 2021-01-19 LAB — HEMOGLOBIN A1C
Est. average glucose Bld gHb Est-mCnc: 120 mg/dL
Hgb A1c MFr Bld: 5.8 % — ABNORMAL HIGH (ref 4.8–5.6)

## 2021-01-19 NOTE — Telephone Encounter (Signed)
Had appointment on 01/18/21

## 2021-01-21 ENCOUNTER — Telehealth: Payer: Self-pay | Admitting: Family Medicine

## 2021-01-21 ENCOUNTER — Other Ambulatory Visit: Payer: Self-pay

## 2021-01-21 DIAGNOSIS — Z79899 Other long term (current) drug therapy: Secondary | ICD-10-CM

## 2021-01-21 DIAGNOSIS — E7849 Other hyperlipidemia: Secondary | ICD-10-CM

## 2021-01-21 MED ORDER — PRAVASTATIN SODIUM 40 MG PO TABS: 40.0000 mg | ORAL_TABLET | Freq: Every evening | ORAL | 1 refills | Status: DC

## 2021-01-21 NOTE — Telephone Encounter (Signed)
Pt called and stated Dr Nicki Reaper was supposed to change his cholesterol medication to something different because it was too high. He said the pharmacy informed him they had the same prescription that he already has. [phone]

## 2021-01-22 MED ORDER — ROSUVASTATIN CALCIUM 20 MG PO TABS: 20.0000 mg | ORAL_TABLET | Freq: Every day | ORAL | 1 refills | Status: DC

## 2021-01-22 NOTE — Telephone Encounter (Signed)
Patient notified. Blood work ordered in [facility].

## 2021-01-22 NOTE — Telephone Encounter (Signed)
Prescription sent electronically to pharmacy  Left message to return call 

## 2021-01-22 NOTE — Telephone Encounter (Signed)
Nurses-I did discuss with patient switching his cholesterol medicine.  We are trying to get his LDL below 70.  So therefore stop pravastatin.  I recommend Crestor 20 mg 1 daily, 90, 1 refill, repeat lipid with liver in 8 to 12 weeks.  Stop pravastatin.

## 2021-03-15 ENCOUNTER — Encounter: Payer: Self-pay | Admitting: Nurse Practitioner

## 2021-03-15 ENCOUNTER — Inpatient Hospital Stay (HOSPITAL_COMMUNITY)
Admission: EM | Admit: 2021-03-15 | Discharge: 2021-03-21 | DRG: 233 | Disposition: A | Discharge status: Home / Self Care | Payer: Commercial Managed Care - PPO | Attending: Cardiothoracic Surgery | Admitting: Cardiothoracic Surgery

## 2021-03-15 ENCOUNTER — Ambulatory Visit: Payer: Commercial Managed Care - PPO | Admitting: Nurse Practitioner

## 2021-03-15 ENCOUNTER — Other Ambulatory Visit: Payer: Self-pay

## 2021-03-15 ENCOUNTER — Encounter (HOSPITAL_COMMUNITY): Payer: Self-pay | Admitting: *Deleted

## 2021-03-15 ENCOUNTER — Emergency Department (HOSPITAL_COMMUNITY): Payer: Commercial Managed Care - PPO

## 2021-03-15 VITALS — BP 142/86 | HR 93 | Temp 98.1°F | Ht 69.0 in | Wt 212.0 lb

## 2021-03-15 DIAGNOSIS — Z8249 Family history of ischemic heart disease and other diseases of the circulatory system: Secondary | ICD-10-CM

## 2021-03-15 DIAGNOSIS — I252 Old myocardial infarction: Secondary | ICD-10-CM | POA: Diagnosis not present

## 2021-03-15 DIAGNOSIS — Z880 Allergy status to penicillin: Secondary | ICD-10-CM | POA: Diagnosis not present

## 2021-03-15 DIAGNOSIS — Z87891 Personal history of nicotine dependence: Secondary | ICD-10-CM | POA: Diagnosis not present

## 2021-03-15 DIAGNOSIS — F17210 Nicotine dependence, cigarettes, uncomplicated: Secondary | ICD-10-CM | POA: Diagnosis present

## 2021-03-15 DIAGNOSIS — Z01810 Encounter for preprocedural cardiovascular examination: Secondary | ICD-10-CM | POA: Diagnosis not present

## 2021-03-15 DIAGNOSIS — D62 Acute posthemorrhagic anemia: Secondary | ICD-10-CM | POA: Diagnosis not present

## 2021-03-15 DIAGNOSIS — I2510 Atherosclerotic heart disease of native coronary artery without angina pectoris: Secondary | ICD-10-CM | POA: Diagnosis present

## 2021-03-15 DIAGNOSIS — I202 Refractory angina pectoris: Secondary | ICD-10-CM

## 2021-03-15 DIAGNOSIS — L4050 Arthropathic psoriasis, unspecified: Secondary | ICD-10-CM | POA: Diagnosis present

## 2021-03-15 DIAGNOSIS — Z20822 Contact with and (suspected) exposure to covid-19: Secondary | ICD-10-CM | POA: Diagnosis present

## 2021-03-15 DIAGNOSIS — Z96641 Presence of right artificial hip joint: Secondary | ICD-10-CM | POA: Diagnosis present

## 2021-03-15 DIAGNOSIS — Z981 Arthrodesis status: Secondary | ICD-10-CM | POA: Diagnosis not present

## 2021-03-15 DIAGNOSIS — I5033 Acute on chronic diastolic (congestive) heart failure: Secondary | ICD-10-CM | POA: Diagnosis present

## 2021-03-15 DIAGNOSIS — I110 Hypertensive heart disease with heart failure: Secondary | ICD-10-CM | POA: Diagnosis present

## 2021-03-15 DIAGNOSIS — E785 Hyperlipidemia, unspecified: Secondary | ICD-10-CM | POA: Diagnosis present

## 2021-03-15 DIAGNOSIS — I214 Non-ST elevation (NSTEMI) myocardial infarction: Secondary | ICD-10-CM | POA: Diagnosis present

## 2021-03-15 DIAGNOSIS — Z885 Allergy status to narcotic agent status: Secondary | ICD-10-CM | POA: Diagnosis not present

## 2021-03-15 DIAGNOSIS — R079 Chest pain, unspecified: Secondary | ICD-10-CM

## 2021-03-15 DIAGNOSIS — Z951 Presence of aortocoronary bypass graft: Secondary | ICD-10-CM

## 2021-03-15 DIAGNOSIS — Z8616 Personal history of COVID-19: Secondary | ICD-10-CM

## 2021-03-15 DIAGNOSIS — Z79899 Other long term (current) drug therapy: Secondary | ICD-10-CM | POA: Diagnosis not present

## 2021-03-15 DIAGNOSIS — R0789 Other chest pain: Secondary | ICD-10-CM

## 2021-03-15 DIAGNOSIS — Z720 Tobacco use: Secondary | ICD-10-CM

## 2021-03-15 DIAGNOSIS — K219 Gastro-esophageal reflux disease without esophagitis: Secondary | ICD-10-CM | POA: Diagnosis present

## 2021-03-15 DIAGNOSIS — E8770 Fluid overload, unspecified: Secondary | ICD-10-CM | POA: Diagnosis not present

## 2021-03-15 DIAGNOSIS — Z85820 Personal history of malignant melanoma of skin: Secondary | ICD-10-CM | POA: Diagnosis not present

## 2021-03-15 DIAGNOSIS — Z09 Encounter for follow-up examination after completed treatment for conditions other than malignant neoplasm: Secondary | ICD-10-CM

## 2021-03-15 DIAGNOSIS — I25110 Atherosclerotic heart disease of native coronary artery with unstable angina pectoris: Secondary | ICD-10-CM | POA: Diagnosis not present

## 2021-03-15 DIAGNOSIS — Z881 Allergy status to other antibiotic agents status: Secondary | ICD-10-CM | POA: Diagnosis not present

## 2021-03-15 DIAGNOSIS — I2582 Chronic total occlusion of coronary artery: Secondary | ICD-10-CM | POA: Diagnosis present

## 2021-03-15 DIAGNOSIS — M109 Gout, unspecified: Secondary | ICD-10-CM | POA: Diagnosis present

## 2021-03-15 DIAGNOSIS — I10 Essential (primary) hypertension: Secondary | ICD-10-CM | POA: Diagnosis not present

## 2021-03-15 LAB — HEPATIC FUNCTION PANEL
ALT: 41 U/L (ref 0–44)
AST: 28 U/L (ref 15–41)
Albumin: 4.4 g/dL (ref 3.5–5.0)
Alkaline Phosphatase: 54 U/L (ref 38–126)
Bilirubin, Direct: 0.1 mg/dL (ref 0.0–0.2)
Indirect Bilirubin: 0.2 mg/dL — ABNORMAL LOW (ref 0.3–0.9)
Total Bilirubin: 0.3 mg/dL (ref 0.3–1.2)
Total Protein: 7.6 g/dL (ref 6.5–8.1)

## 2021-03-15 LAB — BASIC METABOLIC PANEL
Anion gap: 9 (ref 5–15)
BUN: 12 mg/dL (ref 6–20)
CO2: 28 mmol/L (ref 22–32)
Calcium: 9.4 mg/dL (ref 8.9–10.3)
Chloride: 100 mmol/L (ref 98–111)
Creatinine, Ser: 1.07 mg/dL (ref 0.61–1.24)
GFR, Estimated: >60 mL/min (ref >60)
Glucose, Bld: 103 mg/dL — ABNORMAL HIGH (ref 70–99)
Potassium: 3.6 mmol/L (ref 3.5–5.1)
Sodium: 137 mmol/L (ref 135–145)

## 2021-03-15 LAB — TROPONIN I (HIGH SENSITIVITY)
Troponin I (High Sensitivity): 45 ng/L — ABNORMAL HIGH (ref <18)
Troponin I (High Sensitivity): 48 ng/L — ABNORMAL HIGH (ref <18)

## 2021-03-15 LAB — CBC
HCT: 45.8 % (ref 39.0–52.0)
Hemoglobin: 15.7 g/dL (ref 13.0–17.0)
MCH: 30.8 pg (ref 26.0–34.0)
MCHC: 34.3 g/dL (ref 30.0–36.0)
MCV: 90 fL (ref 80.0–100.0)
Platelets: 272 10*3/uL (ref 150–400)
RBC: 5.09 MIL/uL (ref 4.22–5.81)
RDW: 12.8 % (ref 11.5–15.5)
WBC: 10.3 10*3/uL (ref 4.0–10.5)
nRBC: 0 % (ref 0.0–0.2)

## 2021-03-15 MED ORDER — VARENICLINE TARTRATE 1 MG PO TABS: ORAL_TABLET | ORAL | 3 refills | Status: DC

## 2021-03-15 MED ORDER — ROSUVASTATIN CALCIUM 20 MG PO TABS
40.0000 mg | ORAL_TABLET | Freq: Every day | ORAL | Status: DC
  Administered 2021-03-16 – 2021-03-21 (×5): 40 mg via ORAL
  Filled 2021-03-15 (×5): qty 2

## 2021-03-15 MED ORDER — METOPROLOL TARTRATE 12.5 MG HALF TABLET
12.5000 mg | ORAL_TABLET | Freq: Two times a day (BID) | ORAL | Status: DC
  Administered 2021-03-16: 12.5 mg via ORAL
  Filled 2021-03-15 (×3): qty 1

## 2021-03-15 MED ORDER — NICOTINE POLACRILEX 2 MG MT GUM
2.0000 mg | CHEWING_GUM | OROMUCOSAL | Status: DC | PRN
  Filled 2021-03-15: qty 1

## 2021-03-15 MED ORDER — ASPIRIN 325 MG PO TABS
325.0000 mg | ORAL_TABLET | Freq: Once | ORAL | Status: AC
  Administered 2021-03-15: 325 mg via ORAL
  Filled 2021-03-15: qty 1

## 2021-03-15 MED ORDER — LISINOPRIL-HYDROCHLOROTHIAZIDE 10-12.5 MG PO TABS: 1.0000 | ORAL_TABLET | Freq: Every day | ORAL | Status: DC

## 2021-03-15 MED ORDER — PANTOPRAZOLE SODIUM 40 MG PO TBEC
40.0000 mg | DELAYED_RELEASE_TABLET | Freq: Every day | ORAL | Status: DC
  Administered 2021-03-16: 40 mg via ORAL
  Filled 2021-03-15: qty 1

## 2021-03-15 MED ORDER — HEPARIN BOLUS VIA INFUSION
4000.0000 [IU] | Freq: Once | INTRAVENOUS | Status: AC
  Administered 2021-03-15: 4000 [IU] via INTRAVENOUS

## 2021-03-15 MED ORDER — METOPROLOL TARTRATE 25 MG PO TABS
12.5000 mg | ORAL_TABLET | Freq: Once | ORAL | Status: AC
  Administered 2021-03-15: 12.5 mg via ORAL
  Filled 2021-03-15: qty 1

## 2021-03-15 MED ORDER — ASPIRIN EC 81 MG PO TBEC
81.0000 mg | DELAYED_RELEASE_TABLET | Freq: Every day | ORAL | Status: DC
  Administered 2021-03-16: 81 mg via ORAL
  Filled 2021-03-15: qty 1

## 2021-03-15 MED ORDER — HEPARIN (PORCINE) 25000 UT/250ML-% IV SOLN
1300.0000 [IU]/h | INTRAVENOUS | Status: DC
  Administered 2021-03-15 – 2021-03-16 (×2): 1300 [IU]/h via INTRAVENOUS
  Filled 2021-03-15 (×2): qty 250

## 2021-03-15 MED ORDER — ACETAMINOPHEN 325 MG PO TABS
650.0000 mg | ORAL_TABLET | ORAL | Status: DC | PRN
  Administered 2021-03-16: 650 mg via ORAL
  Filled 2021-03-15: qty 2

## 2021-03-15 MED ORDER — ALLOPURINOL 100 MG PO TABS
100.0000 mg | ORAL_TABLET | Freq: Every day | ORAL | Status: DC
  Administered 2021-03-16: 100 mg via ORAL
  Filled 2021-03-15: qty 1

## 2021-03-15 MED ORDER — CHANTIX STARTING MONTH PAK 0.5 MG X 11 & 1 MG X 42 PO TBPK: 0.5000 mg | ORAL_TABLET | Freq: Every day | ORAL | 0 refills | Status: DC

## 2021-03-15 MED ORDER — HYDROCHLOROTHIAZIDE 12.5 MG PO TABS: 12.5000 mg | ORAL_TABLET | Freq: Every day | ORAL | Status: DC

## 2021-03-15 MED ORDER — LISINOPRIL 10 MG PO TABS
10.0000 mg | ORAL_TABLET | Freq: Every day | ORAL | Status: DC
  Administered 2021-03-16: 10 mg via ORAL
  Filled 2021-03-15: qty 1

## 2021-03-15 MED ORDER — NITROGLYCERIN 0.4 MG SL SUBL: 0.4000 mg | SUBLINGUAL_TABLET | SUBLINGUAL | Status: DC | PRN

## 2021-03-15 NOTE — H&P (Signed)
Cardiology Admission History and Physical:   Patient ID: Brian Brewer MRN: [id number]; DOB: 1965-08-05   Admission date: 03/15/2021  PCP:  Kathyrn Drown, MD   [facility] Providers Cardiologist:  None        Chief Complaint: Chest pain  Patient Profile:   Brian Brewer is a 56 y.o. male with a PMH of HTN, HLD, tobacco abuse, GERD, psoriatic arthritis and OA who is being seen 03/15/2021 for the evaluation of NSTEMI.  History of Present Illness:   Brian Brewer reports that he was in his usual state of health until ~8 weeks ago when he developed COVID.  He recovered from his COVID, but shortly thereafter he was outside chopping wood when he developed some chest discomfort and SOB.  The symptoms were short-lived so he attributed to his recent COVID infection.  This morning at ~9 AM he was outside picking up sticks in the yard when he developed severe, central, nonradiating, chest burning with associated SOB.  The symptoms lasted for about 4 minutes before abating. He has never had the symptoms prior to past 8 weeks.  He denies syncope, presyncope, palpitations, PND, orthopnea, swelling, N/V, abdominal pain, weakness, numbness, or DOE.  He presented to his PCP given his symptoms and instructed to come to the ED for evaluation.    Of note the patient has a positive family history for CAD.  His father suffered an MI in his 73s and he had a grandmother that passed away of an MI as well.  He also has an extensive smoking history and has smoked 1 ppd since he was 56 years old (40 pack years).  He currently works in [employer] and lives at home with his wife.  He had a stress echo in 2014 which was negative.  In the ED his VS were afebrile, HR 60, BP 145/85, RR 15, and satting 96% RA.  Labs were unremarkable apart from a troponin of 45 -> 48.  CXR was WNL, EKG showed TWI in aVL and some submillimeter STE in leads III and II.  The patient was completely asymptomatic at the time of my  assessment.  He is admitted to cardiology for ongoing care.   Past Medical History:  Diagnosis Date   Cancer ([place])    melanoma on the back was removed 10/2018   GERD (gastroesophageal reflux disease)    Gout    Hyperlipemia    Hypertension    Psoriatic arthritis ([place]) 12/11/2013   Dr Amil Amen     Past Surgical History:  Procedure Laterality Date   BACK SURGERY  2003   L4,5,S1 Fusion   CHOLECYSTECTOMY     COLONOSCOPY N/A 12/29/2016   Procedure: COLONOSCOPY;  Surgeon: Daneil Dolin, MD;  Location: [facility];  Service: Endoscopy;  Laterality: N/A;  9:15 AM   LUMBAR FUSION     3 yrs ago-[place]   TOTAL HIP ARTHROPLASTY Right 02/15/2019   Procedure: RIGHT TOTAL HIP ARTHROPLASTY ANTERIOR APPROACH;  Surgeon: Mcarthur Rossetti, MD;  Location: [facility];  Service: Orthopedics;  Laterality: Right;     Medications Prior to Admission: Prior to Admission medications   Medication Sig Start Date End Date Taking? Authorizing Provider  allopurinol (ZYLOPRIM) 100 MG tablet TAKE ONE TABLET BY MOUTH ONCE DAILY. Patient taking differently: Take 100 mg by mouth daily. TAKE ONE TABLET BY MOUTH ONCE DAILY. 01/18/21  Yes Luking, Elayne Snare, MD  lisinopril-hydrochlorothiazide (ZESTORETIC) 10-12.5 MG tablet Take 1 tablet by mouth  daily. 01/18/21  Yes Kathyrn Drown, MD  esomeprazole (NEXIUM) 40 MG capsule Take 1 capsule (40 mg total) by mouth daily. 01/18/21   Kathyrn Drown, MD  predniSONE (STERAPRED UNI-PAK 48 TAB) 5 MG (48) TBPK tablet See admin instructions. Patient not taking: Reported on 03/15/2021 01/11/21   [provider]  RINVOQ 15 MG TB24 Take 1 tablet by mouth daily. 02/15/21   [provider]  rosuvastatin (CRESTOR) 20 MG tablet Take 1 tablet (20 mg total) by mouth daily. 01/22/21   Kathyrn Drown, MD  varenicline (CHANTIX CONTINUING MONTH PAK) 1 MG tablet Take one tablet po BID 03/15/21   Kathyrn Drown, MD  Varenicline Tartrate, Starter, (CHANTIX STARTING MONTH  PAK) 0.5 MG X 11 & 1 MG X 42 TBPK Take 0.5 mg by mouth daily. Day 1-3 take 0.5 mg po daily; day 4-7 0.5 mg po BID. Day 8-31 1 mg po daily 03/15/21   Kathyrn Drown, MD     Allergies:    Allergies  Allergen Reactions   Amoxicillin-Pot Clavulanate Other (See Comments)    Cramps and diarrhea Has patient had a PCN reaction causing immediate rash, facial/tongue/throat swelling, SOB or lightheadedness with hypotension: No Has patient had a PCN reaction causing severe rash involving mucus membranes or skin necrosis: No Has patient had a PCN reaction that required hospitalization: No Has patient had a PCN reaction occurring within the last 10 years: No  If all of the above answers are "NO", then may proceed with Cephalosporin use.   Levofloxacin Other (See Comments)    Passed out (possibly due to medication)   Zithromax [Azithromycin] Diarrhea   Chantix [Varenicline Tartrate] Nausea And Vomiting   Oxycodone Diarrhea, Itching and Anxiety    Social History:   Social History   Socioeconomic History   Marital status: Married    Spouse name: Not on file   Number of children: Not on file   Years of education: Not on file   Highest education level: Not on file  Occupational History   Not on file  Tobacco Use   Smoking status: Every Day    Packs/day: 0.50    Years: 20.00    Pack years: 10.00    Types: Cigarettes   Smokeless tobacco: Never  Vaping Use   Vaping Use: Never used  Substance and Sexual Activity   Alcohol use: No   Drug use: No   Sexual activity: Yes    Birth control/protection: None  Other Topics Concern   Not on file  Social History Narrative   Not on file   Social Determinants of Health   Financial Resource Strain: Not on file  Food Insecurity: Not on file  Transportation Needs: Not on file  Physical Activity: Not on file  Stress: Not on file  Social Connections: Not on file  Intimate Partner Violence: Not on file    Family History:   The patient's family  history includes Heart disease in his father; Hypertension in his father; Leukemia in his father. There is no history of Anesthesia problems, Hypotension, Malignant hyperthermia, or Pseudochol deficiency.    ROS:  Please see the history of present illness.  All other ROS reviewed and negative.     Physical Exam/Data:   Vitals:   03/15/21 1858 03/15/21 1934 03/15/21 2004 03/15/21 2100  BP:  (!) 163/93  (!) 145/85  Pulse:  (!) 59  61  Resp:  15  13  Temp:   98.1 F (36.7 C)  TempSrc:   Oral   SpO2:  100%  96%  Weight: 95.9 kg     Height: 5\' 9"  (1.753 m)      No intake or output data in the 24 hours ending 03/15/21 2153 Last 3 Weights 03/15/2021 03/15/2021 03/15/2021  Weight (lbs) 211 lb 6.7 oz 214 lb 15.2 oz 212 lb  Weight (kg) 95.9 kg 97.5 kg 96.163 kg     Body mass index is 31.22 kg/m.  General:  Well nourished, well developed, in no acute distress HEENT: normal, atraumatic, normocephalic Neck: no JVD Vascular: No carotid bruits; Distal pulses 2+ bilaterally   Cardiac:  normal S1, S2; RRR; no murmur  Lungs:  clear to auscultation bilaterally, no wheezing, rhonchi or rales  Abd: soft, nontender, no hepatomegaly  Ext: no edema Musculoskeletal:  No deformities, BUE and BLE strength normal and equal Skin: warm and dry  Neuro:  CNs 2-12 intact, no focal abnormalities noted Psych:  Normal affect    EKG:  The ECG that was done was personally reviewed and demonstrates NSR, TWI in aVL, submillimeter STE in III and II similar to prior EKG    Relevant CV Studies:  Stress Echo 03/30/12:  Study Conclusions   - Stress ECG conclusions: There were no stress arrhythmias    or conduction abnormalities. The stress ECG was negative    for ischemia with only insignificant upsloping ST segment    depression noted.  - Staged echo: There was no echocardiographic evidence for    stress-induced ischemia.   Laboratory Data:  High Sensitivity Troponin:   Recent Labs  Lab 03/15/21 1715  03/15/21 1853  TROPONINIHS 45* 48*      Chemistry Recent Labs  Lab 03/15/21 1715  NA 137  K 3.6  CL 100  CO2 28  GLUCOSE 103*  BUN 12  CREATININE 1.07  CALCIUM 9.4  GFRNONAA >60  ANIONGAP 9    Recent Labs  Lab 03/15/21 1715  PROT 7.6  ALBUMIN 4.4  AST 28  ALT 41  ALKPHOS 54  BILITOT 0.3   Lipids No results for input(s): CHOL, TRIG, HDL, LABVLDL, LDLCALC, CHOLHDL in the last 168 hours. Hematology Recent Labs  Lab 03/15/21 1715  WBC 10.3  RBC 5.09  HGB 15.7  HCT 45.8  MCV 90.0  MCH 30.8  MCHC 34.3  RDW 12.8  PLT 272   Thyroid No results for input(s): TSH, FREET4 in the last 168 hours. BNPNo results for input(s): BNP, PROBNP in the last 168 hours.  DDimer No results for input(s): DDIMER in the last 168 hours.   Radiology/Studies:  DG Chest Port 1 View  Result Date: 03/15/2021 CLINICAL DATA:  Chest pain with abnormal EKG today. EXAM: PORTABLE CHEST 1 VIEW COMPARISON:  Chest two views 10/27/2016 FINDINGS: Cardiac silhouette and mediastinal contours are within normal limits with mild calcification seen within the aortic arch. The lungs are clear. No pulmonary edema, pleural effusion, or pneumothorax. Mild multilevel degenerative disc changes of the thoracic spine. IMPRESSION: No active disease. Electronically Signed   By: Yvonne Kendall M.D.   On: 03/15/2021 17:46     Assessment and Plan:   Brian Brewer is a 56 y.o. male with a PMH of HTN, HLD, tobacco abuse, GERD, psoriatic arthritis and OA who is being seen 03/15/2021 for the evaluation of NSTEMI.  #NSTEMI, Likely Type I :: Patient presenting with acute onset chest pain, troponin elevations, and EKG changes are consistent with ACS.  Risk factors include age, family history,  tobacco use, and comorbidities.  An LHC is warranted.  We will treat medically with heparin and aspirin and admit to cardiology for cardiovascular work-up. -LHC in AM -Heparin per pharmacy -TTE -Give ASA 325 mg x1 followed by ASA 81 mg  daily -Start metoprolol tartrate 12.5 mg twice daily -Continue home lisinopril/HCTZ -Increase home Crestor from 20 mg to 40 mg daily -Maintain telemetry -N.p.o. at [place] -Cardiac rehab at discharge  #HTN #HLD -Continue home lisinopril/HCTZ -Start metoprolol as above -Increase statin as above  #Tobacco Abuse :: Longstanding tobacco abuse which is a major risk factor for an MI.  It looks like his PCP had just prescribed Chantix for him.  I totally agree with the patient starting this medication after this hospitalization.  He has expressed interest in quitting.  He states that Nicorette gum really helps curb his cravings so we will prescribe this time. -Given Nicorette gum -Initiate Chantix upon discharge   Risk Assessment/Risk Scores:    TIMI Risk Score for Unstable Angina or Non-ST Elevation MI:   The patient's TIMI risk score is 3, which indicates a 13% risk of all cause mortality, new or recurrent myocardial infarction or need for urgent revascularization in the next 14 days.       Severity of Illness: The appropriate patient status for this patient is INPATIENT. Inpatient status is judged to be reasonable and necessary in order to provide the required intensity of service to ensure the patient's safety. The patient's presenting symptoms, physical exam findings, and initial radiographic and laboratory data in the context of their chronic comorbidities is felt to place them at high risk for further clinical deterioration. Furthermore, it is not anticipated that the patient will be medically stable for discharge from the hospital within 2 midnights of admission.   * I certify that at the point of admission it is my clinical judgment that the patient will require inpatient hospital care spanning beyond 2 midnights from the point of admission due to high intensity of service, high risk for further deterioration and high frequency of surveillance required.*   For questions or updates,  please contact [facility] Please consult [email or web address] for contact info under     Signed, Hershal Coria, MD  03/15/2021 9:53 PM

## 2021-03-15 NOTE — Progress Notes (Signed)
° °  Subjective:    Patient ID: Brian Brewer, male    DOB: [birth year], 56 y.o.   MRN: [id number]  HPI Patient presents to clinic today with complaints of fatigue, shortness of breath with exertion, chest burning, and feelings that his heart is racing x2 weeks.  Patient states that he had an episode like this this morning around 8:00 AM when moving brush around in the yard.  Patient states that he felt like his chest burning and he felt his heart racing.  Symptoms are relieved with rest.  Patient admits to having several episodes like this however he believed this was due to his recent COVID infection that occurred 5 to 6 weeks ago. Patient also considered his recent use of Rinvoq as a cause of his symptoms as well.  Patient denies current chest burning or heart racing.   Review of Systems  Respiratory:  Positive for shortness of breath.   Cardiovascular:        Chest burning      Objective:   Physical Exam Constitutional:      General: He is not in acute distress.    Appearance: Normal appearance. He is normal weight. He is not ill-appearing or toxic-appearing.  Neck:     Vascular: No carotid bruit.  Cardiovascular:     Rate and Rhythm: Regular rhythm. Bradycardia present.     Pulses: Normal pulses.     Heart sounds: Normal heart sounds. No murmur heard. Pulmonary:     Effort: Pulmonary effort is normal. No respiratory distress.     Breath sounds: Normal breath sounds. No wheezing.  Musculoskeletal:        General: Normal range of motion.     Cervical back: Normal range of motion and neck supple. No rigidity or tenderness.  Lymphadenopathy:     Cervical: No cervical adenopathy.  Skin:    General: Skin is warm.     Capillary Refill: Capillary refill takes less than 2 seconds.  Neurological:     General: No focal deficit present.     Mental Status: He is alert and oriented to person, place, and time.  Psychiatric:        Mood and Affect: Mood normal.          Assessment  & Plan:   1. Burning chest pain -Possible cardiac ischemia as per changes to EKG. - EKG 12-Lead -Changes to EKG from 2021.   -ST elevation noted in V4 and lead III. T depression noted to V2. -Patient to go directly to emergency room at [facility] for further evaluation. -Patient to be directly Brought to emergency room via [facility] staff. - Follow up in clinic 1-2 days after discharge.     2. Hx of smoking - varenicline (CHANTIX CONTINUING MONTH PAK) 1 MG tablet; Take one tablet po BID  Dispense: 60 tablet; Refill: 3 - Varenicline Tartrate, Starter, (CHANTIX STARTING MONTH PAK) 0.5 MG X 11 & 1 MG X 42 TBPK; Take 0.5 mg by mouth daily. Day 1-3 take 0.5 mg po daily; day 4-7 0.5 mg po BID. Day 8-31 1 mg po daily  Dispense: 53 each; Refill: 0     Note:  This document was prepared using Dragon voice recognition software and may include unintentional dictation errors.

## 2021-03-15 NOTE — ED Triage Notes (Signed)
Abnormal EKG  THIS AM

## 2021-03-15 NOTE — ED Provider Notes (Addendum)
[facility] EMERGENCY DEPARTMENT Provider Note   CSN: [id number] Arrival date & time: 03/15/21  1653     History  Chief Complaint  Patient presents with   Chest Pain    Brian Brewer is a 56 y.o. male.  Patient states that he has been having dyspnea on exertion lately.  Today he had an episode where he had some shortness of breath and chest tightness for a few minutes.  Patient without pain now  The history is provided by the patient and medical records. No language interpreter was used.  Chest Pain Pain location:  Substernal area Pain quality: aching   Pain radiates to:  Does not radiate Pain severity:  Mild Onset quality:  Sudden Timing:  Intermittent Progression:  Resolved Chronicity:  New Context: not breathing   Relieved by:  Nothing Worsened by:  Nothing Ineffective treatments:  None tried Associated symptoms: no abdominal pain, no back pain, no cough, no fatigue and no headache       Home Medications Prior to Admission medications   Medication Sig Start Date End Date Taking? Authorizing Provider  allopurinol (ZYLOPRIM) 100 MG tablet TAKE ONE TABLET BY MOUTH ONCE DAILY. 01/18/21   Kathyrn Drown, MD  esomeprazole (NEXIUM) 40 MG capsule Take 1 capsule (40 mg total) by mouth daily. 01/18/21   Kathyrn Drown, MD  lisinopril-hydrochlorothiazide (ZESTORETIC) 10-12.5 MG tablet Take 1 tablet by mouth daily. 01/18/21   Kathyrn Drown, MD  predniSONE (STERAPRED UNI-PAK 48 TAB) 5 MG (48) TBPK tablet See admin instructions. Patient not taking: Reported on 03/15/2021 01/11/21   [provider]  RINVOQ 15 MG TB24 Take 1 tablet by mouth daily. 02/15/21   [provider]  rosuvastatin (CRESTOR) 20 MG tablet Take 1 tablet (20 mg total) by mouth daily. 01/22/21   Kathyrn Drown, MD  varenicline (CHANTIX CONTINUING MONTH PAK) 1 MG tablet Take one tablet po BID 03/15/21   Kathyrn Drown, MD  Varenicline Tartrate, Starter, (CHANTIX STARTING MONTH PAK) 0.5 MG X  11 & 1 MG X 42 TBPK Take 0.5 mg by mouth daily. Day 1-3 take 0.5 mg po daily; day 4-7 0.5 mg po BID. Day 8-31 1 mg po daily 03/15/21   Kathyrn Drown, MD      Allergies    Amoxicillin-pot clavulanate, Levofloxacin, Zithromax [azithromycin], Chantix [varenicline tartrate], and Oxycodone    Review of Systems   Review of Systems  Constitutional:  Negative for appetite change and fatigue.  HENT:  Negative for congestion, ear discharge and sinus pressure.   Eyes:  Negative for discharge.  Respiratory:  Negative for cough.   Cardiovascular:  Positive for chest pain.  Gastrointestinal:  Negative for abdominal pain and diarrhea.  Genitourinary:  Negative for frequency and hematuria.  Musculoskeletal:  Negative for back pain.  Skin:  Negative for rash.  Neurological:  Negative for seizures and headaches.  Psychiatric/Behavioral:  Negative for hallucinations.    Physical Exam Updated Vital Signs BP (!) 163/91    Pulse 60    Temp 98 F (36.7 C) (Oral)    Resp 13    Ht 5\' 9"  (1.753 m)    Wt 97.5 kg    SpO2 99%    BMI 31.74 kg/m  Physical Exam Vitals and nursing note reviewed.  Constitutional:      Appearance: He is well-developed.  HENT:     Head: Normocephalic.     Nose: Nose normal.  Eyes:     General: No  scleral icterus.    Conjunctiva/sclera: Conjunctivae normal.  Neck:     Thyroid: No thyromegaly.  Cardiovascular:     Rate and Rhythm: Normal rate and regular rhythm.     Heart sounds: No murmur heard.   No friction rub. No gallop.  Pulmonary:     Breath sounds: No stridor. No wheezing or rales.  Chest:     Chest wall: No tenderness.  Abdominal:     General: There is no distension.     Tenderness: There is no abdominal tenderness. There is no rebound.  Musculoskeletal:        General: Normal range of motion.     Cervical back: Neck supple.  Lymphadenopathy:     Cervical: No cervical adenopathy.  Skin:    Findings: No erythema or rash.  Neurological:     Mental Status:  He is alert and oriented to person, place, and time.     Motor: No abnormal muscle tone.     Coordination: Coordination normal.  Psychiatric:        Behavior: Behavior normal.    ED Results / Procedures / Treatments   Labs (all labs ordered are listed, but only abnormal results are displayed) Labs Reviewed  BASIC METABOLIC PANEL - Abnormal; Notable for the following components:      Result Value   Glucose, Bld 103 (*)    All other components within normal limits  HEPATIC FUNCTION PANEL - Abnormal; Notable for the following components:   Indirect Bilirubin 0.2 (*)    All other components within normal limits  TROPONIN I (HIGH SENSITIVITY) - Abnormal; Notable for the following components:   Troponin I (High Sensitivity) 45 (*)    All other components within normal limits  CBC  TROPONIN I (HIGH SENSITIVITY)    EKG None  Radiology DG Chest Port 1 View  Result Date: 03/15/2021 CLINICAL DATA:  Chest pain with abnormal EKG today. EXAM: PORTABLE CHEST 1 VIEW COMPARISON:  Chest two views 10/27/2016 FINDINGS: Cardiac silhouette and mediastinal contours are within normal limits with mild calcification seen within the aortic arch. The lungs are clear. No pulmonary edema, pleural effusion, or pneumothorax. Mild multilevel degenerative disc changes of the thoracic spine. IMPRESSION: No active disease. Electronically Signed   By: Yvonne Kendall M.D.   On: 03/15/2021 17:46    Procedures Procedures    Medications Ordered in ED Medications  heparin bolus via infusion 4,000 Units (has no administration in time range)    ED Course/ Medical Decision Making/ A&P        CRITICAL CARE Performed by: Milton Ferguson Total critical care time: 40 minutes Critical care time was exclusive of separately billable procedures and treating other patients. Critical care was necessary to treat or prevent imminent or life-threatening deterioration. Critical care was time spent personally by me on the  following activities: development of treatment plan with patient and/or surrogate as well as nursing, discussions with consultants, evaluation of patient's response to treatment, examination of patient, obtaining history from patient or surrogate, ordering and performing treatments and interventions, ordering and review of laboratory studies, ordering and review of radiographic studies, pulse oximetry and re-evaluation of patient's condition.        Patient with dyspnea on exertion and some chest discomfort earlier today.  First troponin was elevated minimally.  EKG shows minimal ST elevation but this is identical to his old EKGs.  I spoke with cardiology Dr. Harriet Masson and she agreed the patient was not having a STEMI.  She did want the patient started on heparin and given Lopressor and being transferred to [facility] for an NSTEMI.  There are no beds available at [facility] and it was decided the patient needed to be transferred to the emergency department for cardiology to see              Medical Decision Making Amount and/or Complexity of Data Reviewed Labs: ordered. Radiology: ordered.  Risk Prescription drug management.   Chest pain with elevated troponin.  NSTEMI.  Cardiology will see the patient.  Dr. Sherren Mocha is excepting physician from cardiology standpoint.   Dr. Almyra Free is the accepting physician ED physician     This patient presents to the ED for concern of chest pain, this involves an extensive number of treatment options, and is a complaint that carries with it a high risk of complications and morbidity.  The differential diagnosis includes acute coronary syndrome, pneumonia   Co morbidities that complicate the patient evaluation  Hyperlipidemia   Additional history obtained:  Additional history obtained from significant other and his PCP External records from outside source obtained and reviewed including hospital record   Lab Tests:  I Ordered, and personally interpreted labs.  The  pertinent results include: CBC chemistries and troponin which show mild elevated troponin at 45   Imaging Studies ordered:  I ordered imaging studies including chest x-ray I independently visualized and interpreted imaging which showed unremarkable I agree with the radiologist interpretation   Cardiac Monitoring:  The patient was maintained on a cardiac monitor.  I personally viewed and interpreted the cardiac monitored which showed an underlying rhythm of: Normal sinus rhythm   Medicines ordered and prescription drug management:  I ordered medication including heparin for NSTEMI, Lopressor for NSTEMI Reevaluation of the patient after these medicines showed that the patient stayed the same I have reviewed the patients home medicines and have made adjustments as needed   Test Considered:  Cardiac cath   Critical Interventions:  Heparin bolus   Consultations Obtained:  I requested consultation with the cardiology,  and discussed lab and imaging findings as well as pertinent plan - they recommend: Admission to [facility] for further evaluation for coronary artery disease.  Since there is no beds at [facility] for now cardiologist agreed with sending the patient to the ED   Problem List / ED Course:  Chest pain and NSTEMI   Reevaluation:  After the interventions noted above, I reevaluated the patient and found that they have :stayed the same   Social Determinants of Health:  None   Dispostion:  After consideration of the diagnostic results and the patients response to treatment, I feel that the patent would benefit from transfer to [facility] for cardiology to admit.     Final Clinical Impression(s) / ED Diagnoses Final diagnoses:  Chest pain    Rx / DC Orders ED Discharge Orders     None         Milton Ferguson, MD 03/15/21 Aline August, MD 03/15/21 1905

## 2021-03-15 NOTE — Progress Notes (Signed)
ANTICOAGULATION CONSULT NOTE - Initial Consult  Pharmacy Consult for heparin Indication: chest pain/ACS  Allergies  Allergen Reactions   Amoxicillin-Pot Clavulanate Other (See Comments)    Cramps and diarrhea Has patient had a PCN reaction causing immediate rash, facial/tongue/throat swelling, SOB or lightheadedness with hypotension: No Has patient had a PCN reaction causing severe rash involving mucus membranes or skin necrosis: No Has patient had a PCN reaction that required hospitalization: No Has patient had a PCN reaction occurring within the last 10 years: No  If all of the above answers are "NO", then may proceed with Cephalosporin use.   Levofloxacin Other (See Comments)    Passed out (possibly due to medication)   Zithromax [Azithromycin] Diarrhea   Chantix [Varenicline Tartrate] Nausea And Vomiting   Oxycodone Diarrhea, Itching and Anxiety    Patient Measurements: Height: 5\' 9"  (175.3 cm) Weight: 95.9 kg (211 lb 6.7 oz) IBW/kg (Calculated) : 70.7 Heparin Dosing Weight: 91 kg  Vital Signs: Temp: 98 F (36.7 C) (02/13 1659) Temp Source: Oral (02/13 1659) BP: 163/91 (02/13 1730) Pulse Rate: 60 (02/13 1730)  Labs: Recent Labs    03/15/21 1715  HGB 15.7  HCT 45.8  PLT 272  CREATININE 1.07  TROPONINIHS 45*    Estimated Creatinine Clearance: 90.2 mL/min (by C-G formula based on SCr of 1.07 mg/dL).   Medical History: Past Medical History:  Diagnosis Date   Cancer ([place])    melanoma on the back was removed 10/2018   GERD (gastroesophageal reflux disease)    Gout    Hyperlipemia    Hypertension    Psoriatic arthritis ([place]) 12/11/2013   Dr Amil Amen     Assessment: [age] M with dyspnea on exertion and chest pain with suspected NSTEMI. No known anticoagulation prior to admission at this time. Med rec is pending, no anticoagulants on available RX fill history or chart review.  Pharmacy consulted for heparin. H/H wnl, plt wnl.  Goal of Therapy:  Heparin level  0.3-0.7 units/ml Monitor platelets by anticoagulation protocol: Yes   Plan:  Heparin 4000 units x1 then 1300 units/hr F/u 6hr HL  Monitor daily heparin level, CBC Monitor for signs/symptoms of bleeding    Benetta Spar, PharmD, BCPS, BCCP Clinical Pharmacist  Please check AMION for all [place] phone numbers After 10:00 PM, call [place]

## 2021-03-16 ENCOUNTER — Inpatient Hospital Stay (HOSPITAL_COMMUNITY): Payer: Commercial Managed Care - PPO

## 2021-03-16 ENCOUNTER — Inpatient Hospital Stay (HOSPITAL_COMMUNITY)
Admission: EM | Disposition: A | Discharge status: Home / Self Care | Payer: Self-pay | Source: Home / Self Care | Attending: Cardiothoracic Surgery

## 2021-03-16 DIAGNOSIS — L4050 Arthropathic psoriasis, unspecified: Secondary | ICD-10-CM

## 2021-03-16 DIAGNOSIS — I214 Non-ST elevation (NSTEMI) myocardial infarction: Secondary | ICD-10-CM

## 2021-03-16 DIAGNOSIS — I25110 Atherosclerotic heart disease of native coronary artery with unstable angina pectoris: Secondary | ICD-10-CM

## 2021-03-16 DIAGNOSIS — Z720 Tobacco use: Secondary | ICD-10-CM

## 2021-03-16 DIAGNOSIS — I2510 Atherosclerotic heart disease of native coronary artery without angina pectoris: Secondary | ICD-10-CM

## 2021-03-16 DIAGNOSIS — E785 Hyperlipidemia, unspecified: Secondary | ICD-10-CM

## 2021-03-16 DIAGNOSIS — Z8249 Family history of ischemic heart disease and other diseases of the circulatory system: Secondary | ICD-10-CM

## 2021-03-16 DIAGNOSIS — Z01810 Encounter for preprocedural cardiovascular examination: Secondary | ICD-10-CM

## 2021-03-16 HISTORY — PX: LEFT HEART CATH AND CORONARY ANGIOGRAPHY: CATH118249

## 2021-03-16 LAB — MAGNESIUM: Magnesium: 2 mg/dL (ref 1.7–2.4)

## 2021-03-16 LAB — CBC
HCT: 43.7 % (ref 39.0–52.0)
Hemoglobin: 15.5 g/dL (ref 13.0–17.0)
MCH: 31 pg (ref 26.0–34.0)
MCHC: 35.5 g/dL (ref 30.0–36.0)
MCV: 87.4 fL (ref 80.0–100.0)
Platelets: 258 10*3/uL (ref 150–400)
RBC: 5 MIL/uL (ref 4.22–5.81)
RDW: 12.9 % (ref 11.5–15.5)
WBC: 8.5 10*3/uL (ref 4.0–10.5)
nRBC: 0 % (ref 0.0–0.2)

## 2021-03-16 LAB — ECHOCARDIOGRAM COMPLETE
AR max vel: 3.01 cm2
AV Peak grad: 2.7 mmHg
Ao pk vel: 0.82 m/s
Area-P 1/2: 4.6 cm2
Height: 69 in
S' Lateral: 2.3 cm
Single Plane A4C EF: 45.8 %
Weight: 3272 oz

## 2021-03-16 LAB — URINALYSIS, ROUTINE W REFLEX MICROSCOPIC
Bilirubin Urine: NEGATIVE
Glucose, UA: NEGATIVE mg/dL
Hgb urine dipstick: NEGATIVE
Ketones, ur: NEGATIVE mg/dL
Leukocytes,Ua: NEGATIVE
Nitrite: NEGATIVE
Protein, ur: NEGATIVE mg/dL
Specific Gravity, Urine: 1.034 — ABNORMAL HIGH (ref 1.005–1.030)
pH: 8 (ref 5.0–8.0)

## 2021-03-16 LAB — BASIC METABOLIC PANEL
Anion gap: 10 (ref 5–15)
BUN: 12 mg/dL (ref 6–20)
CO2: 23 mmol/L (ref 22–32)
Calcium: 9.3 mg/dL (ref 8.9–10.3)
Chloride: 105 mmol/L (ref 98–111)
Creatinine, Ser: 1.12 mg/dL (ref 0.61–1.24)
GFR, Estimated: >60 mL/min (ref >60)
Glucose, Bld: 109 mg/dL — ABNORMAL HIGH (ref 70–99)
Potassium: 3.9 mmol/L (ref 3.5–5.1)
Sodium: 138 mmol/L (ref 135–145)

## 2021-03-16 LAB — HEMOGLOBIN A1C
Hgb A1c MFr Bld: 5.7 % — ABNORMAL HIGH (ref 4.8–5.6)
Mean Plasma Glucose: 116.89 mg/dL

## 2021-03-16 LAB — BLOOD GAS, ARTERIAL
Acid-Base Excess: 2.7 mmol/L — ABNORMAL HIGH (ref 0.0–2.0)
Bicarbonate: 26.2 mmol/L (ref 20.0–28.0)
Drawn by: 36277
FIO2: 21 %
O2 Saturation: 97.6 %
Patient temperature: 37
pCO2 arterial: 36 mmHg (ref 32–48)
pH, Arterial: 7.47 — ABNORMAL HIGH (ref 7.35–7.45)
pO2, Arterial: 81 mmHg — ABNORMAL LOW (ref 83–108)

## 2021-03-16 LAB — ABO/RH: ABO/RH(D): A POS

## 2021-03-16 LAB — RESP PANEL BY RT-PCR (FLU A&B, COVID) ARPGX2
Influenza A by PCR: NEGATIVE
Influenza B by PCR: NEGATIVE
SARS Coronavirus 2 by RT PCR: NEGATIVE

## 2021-03-16 LAB — SURGICAL PCR SCREEN
MRSA, PCR: NEGATIVE
Staphylococcus aureus: NEGATIVE

## 2021-03-16 LAB — HIV ANTIBODY (ROUTINE TESTING W REFLEX): HIV Screen 4th Generation wRfx: NONREACTIVE

## 2021-03-16 LAB — PROTIME-INR
INR: 0.9 (ref 0.8–1.2)
Prothrombin Time: 12.5 seconds (ref 11.4–15.2)

## 2021-03-16 LAB — TSH: TSH: 1.539 u[IU]/mL (ref 0.350–4.500)

## 2021-03-16 LAB — PREPARE RBC (CROSSMATCH)

## 2021-03-16 LAB — APTT: aPTT: >200 seconds (ref 24–36)

## 2021-03-16 LAB — HEPARIN LEVEL (UNFRACTIONATED)
Heparin Unfractionated: 0.58 IU/mL (ref 0.30–0.70)
Heparin Unfractionated: 0.51 IU/mL (ref 0.30–0.70)

## 2021-03-16 SURGERY — LEFT HEART CATH AND CORONARY ANGIOGRAPHY
Anesthesia: LOCAL

## 2021-03-16 MED ORDER — SODIUM CHLORIDE 0.9 % IV SOLN: 250.0000 mL | INTRAVENOUS | Status: DC | PRN

## 2021-03-16 MED ORDER — PLASMA-LYTE A IV SOLN
INTRAVENOUS | Status: DC
  Filled 2021-03-16: qty 2.5

## 2021-03-16 MED ORDER — DEXMEDETOMIDINE HCL IN NACL 400 MCG/100ML IV SOLN
0.1000 ug/kg/h | INTRAVENOUS | Status: AC
  Administered 2021-03-17: .3 ug/kg/h via INTRAVENOUS
  Filled 2021-03-16: qty 100

## 2021-03-16 MED ORDER — MILRINONE LACTATE IN DEXTROSE 20-5 MG/100ML-% IV SOLN
0.3000 ug/kg/min | INTRAVENOUS | Status: DC
  Filled 2021-03-16 (×2): qty 100

## 2021-03-16 MED ORDER — TRANEXAMIC ACID 1000 MG/10ML IV SOLN
1.5000 mg/kg/h | INTRAVENOUS | Status: AC
  Administered 2021-03-17: 1.5 mg/kg/h via INTRAVENOUS
  Filled 2021-03-16: qty 25

## 2021-03-16 MED ORDER — HEPARIN (PORCINE) 25000 UT/250ML-% IV SOLN
1300.0000 [IU]/h | INTRAVENOUS | Status: DC
  Administered 2021-03-16: 1300 [IU]/h via INTRAVENOUS

## 2021-03-16 MED ORDER — ASPIRIN 81 MG PO CHEW: 81.0000 mg | CHEWABLE_TABLET | ORAL | Status: DC

## 2021-03-16 MED ORDER — TEMAZEPAM 15 MG PO CAPS: 15.0000 mg | ORAL_CAPSULE | Freq: Once | ORAL | Status: DC | PRN

## 2021-03-16 MED ORDER — MIDAZOLAM HCL 2 MG/2ML IJ SOLN
INTRAMUSCULAR | Status: AC
  Filled 2021-03-16: qty 2

## 2021-03-16 MED ORDER — SODIUM CHLORIDE 0.9 % WEIGHT BASED INFUSION
1.0000 mL/kg/h | INTRAVENOUS | Status: DC
  Administered 2021-03-16: 1 mL/kg/h via INTRAVENOUS

## 2021-03-16 MED ORDER — CHLORHEXIDINE GLUCONATE 0.12 % MT SOLN
15.0000 mL | Freq: Once | OROMUCOSAL | Status: AC
  Administered 2021-03-17: 15 mL via OROMUCOSAL
  Filled 2021-03-16: qty 15

## 2021-03-16 MED ORDER — GUAIFENESIN ER 600 MG PO TB12
600.0000 mg | ORAL_TABLET | Freq: Two times a day (BID) | ORAL | Status: DC
  Administered 2021-03-16: 600 mg via ORAL
  Filled 2021-03-16: qty 1

## 2021-03-16 MED ORDER — TRANEXAMIC ACID (OHS) PUMP PRIME SOLUTION
2.0000 mg/kg | INTRAVENOUS | Status: DC
  Filled 2021-03-16: qty 1.86

## 2021-03-16 MED ORDER — MIDAZOLAM HCL 2 MG/2ML IJ SOLN
INTRAMUSCULAR | Status: DC | PRN
  Administered 2021-03-16: 1 mg via INTRAVENOUS

## 2021-03-16 MED ORDER — SODIUM CHLORIDE 0.9% FLUSH
3.0000 mL | Freq: Two times a day (BID) | INTRAVENOUS | Status: DC
  Administered 2021-03-16 (×2): 3 mL via INTRAVENOUS

## 2021-03-16 MED ORDER — DIAZEPAM 5 MG PO TABS
5.0000 mg | ORAL_TABLET | Freq: Once | ORAL | Status: AC
  Administered 2021-03-17: 5 mg via ORAL
  Filled 2021-03-16: qty 1

## 2021-03-16 MED ORDER — HEPARIN 30,000 UNITS/1000 ML (OHS) CELLSAVER SOLUTION
Status: DC
  Filled 2021-03-16: qty 1000

## 2021-03-16 MED ORDER — SODIUM CHLORIDE 0.9 % WEIGHT BASED INFUSION
3.0000 mL/kg/h | INTRAVENOUS | Status: DC
  Administered 2021-03-16: 3 mL/kg/h via INTRAVENOUS

## 2021-03-16 MED ORDER — POTASSIUM CHLORIDE 2 MEQ/ML IV SOLN
80.0000 meq | INTRAVENOUS | Status: DC
  Filled 2021-03-16: qty 40

## 2021-03-16 MED ORDER — METOPROLOL TARTRATE 12.5 MG HALF TABLET: 12.5000 mg | ORAL_TABLET | Freq: Once | ORAL | Status: DC

## 2021-03-16 MED ORDER — CEFAZOLIN SODIUM-DEXTROSE 2-4 GM/100ML-% IV SOLN
2.0000 g | INTRAVENOUS | Status: AC
  Administered 2021-03-17: 2 g via INTRAVENOUS
  Filled 2021-03-16 (×2): qty 100

## 2021-03-16 MED ORDER — VERAPAMIL HCL 2.5 MG/ML IV SOLN
INTRAVENOUS | Status: DC | PRN
  Administered 2021-03-16: 10 mL via INTRA_ARTERIAL

## 2021-03-16 MED ORDER — LIDOCAINE HCL (PF) 1 % IJ SOLN
INTRAMUSCULAR | Status: DC | PRN
  Administered 2021-03-16: 5 mL

## 2021-03-16 MED ORDER — BISACODYL 5 MG PO TBEC
5.0000 mg | DELAYED_RELEASE_TABLET | Freq: Once | ORAL | Status: DC
  Filled 2021-03-16: qty 1

## 2021-03-16 MED ORDER — INSULIN REGULAR(HUMAN) IN NACL 100-0.9 UT/100ML-% IV SOLN
INTRAVENOUS | Status: AC
  Administered 2021-03-17: 1 [IU]/h via INTRAVENOUS
  Filled 2021-03-16 (×2): qty 100

## 2021-03-16 MED ORDER — CHLORHEXIDINE GLUCONATE 4 % EX LIQD
60.0000 mL | Freq: Once | CUTANEOUS | Status: AC
  Administered 2021-03-17: 4 via TOPICAL
  Filled 2021-03-16: qty 60

## 2021-03-16 MED ORDER — NITROGLYCERIN IN D5W 200-5 MCG/ML-% IV SOLN
2.0000 ug/min | INTRAVENOUS | Status: AC
  Administered 2021-03-17: 16.6 ug/min via INTRAVENOUS
  Filled 2021-03-16 (×2): qty 250

## 2021-03-16 MED ORDER — EPINEPHRINE HCL 5 MG/250ML IV SOLN IN NS
0.0000 ug/min | INTRAVENOUS | Status: DC
  Filled 2021-03-16: qty 250

## 2021-03-16 MED ORDER — SODIUM CHLORIDE 0.9% FLUSH: 3.0000 mL | INTRAVENOUS | Status: DC | PRN

## 2021-03-16 MED ORDER — FLUTICASONE FUROATE-VILANTEROL 200-25 MCG/ACT IN AEPB
1.0000 | INHALATION_SPRAY | Freq: Every day | RESPIRATORY_TRACT | Status: DC
  Administered 2021-03-16: 1 via RESPIRATORY_TRACT
  Filled 2021-03-16: qty 28

## 2021-03-16 MED ORDER — IOHEXOL 350 MG/ML SOLN
INTRAVENOUS | Status: DC | PRN
  Administered 2021-03-16: 85 mL via INTRACARDIAC

## 2021-03-16 MED ORDER — FENTANYL CITRATE (PF) 100 MCG/2ML IJ SOLN
INTRAMUSCULAR | Status: AC
  Filled 2021-03-16: qty 2

## 2021-03-16 MED ORDER — FENTANYL CITRATE (PF) 100 MCG/2ML IJ SOLN
INTRAMUSCULAR | Status: DC | PRN
  Administered 2021-03-16: 25 ug via INTRAVENOUS

## 2021-03-16 MED ORDER — TRANEXAMIC ACID (OHS) BOLUS VIA INFUSION
15.0000 mg/kg | INTRAVENOUS | Status: AC
  Administered 2021-03-17: 1392 mg via INTRAVENOUS
  Filled 2021-03-16: qty 1392

## 2021-03-16 MED ORDER — MAGNESIUM SULFATE 50 % IJ SOLN
40.0000 meq | INTRAMUSCULAR | Status: DC
  Filled 2021-03-16: qty 9.85

## 2021-03-16 MED ORDER — NOREPINEPHRINE 4 MG/250ML-% IV SOLN
0.0000 ug/min | INTRAVENOUS | Status: DC
  Filled 2021-03-16: qty 250

## 2021-03-16 MED ORDER — ALPRAZOLAM 0.25 MG PO TABS: 0.2500 mg | ORAL_TABLET | ORAL | Status: DC | PRN

## 2021-03-16 MED ORDER — LIDOCAINE HCL (PF) 1 % IJ SOLN
INTRAMUSCULAR | Status: AC
  Filled 2021-03-16: qty 30

## 2021-03-16 MED ORDER — HEPARIN (PORCINE) IN NACL 1000-0.9 UT/500ML-% IV SOLN
INTRAVENOUS | Status: AC
  Filled 2021-03-16: qty 1000

## 2021-03-16 MED ORDER — HEPARIN SODIUM (PORCINE) 1000 UNIT/ML IJ SOLN
INTRAMUSCULAR | Status: DC | PRN
  Administered 2021-03-16: 4500 [IU] via INTRAVENOUS

## 2021-03-16 MED ORDER — PHENYLEPHRINE HCL-NACL 20-0.9 MG/250ML-% IV SOLN
30.0000 ug/min | INTRAVENOUS | Status: AC
  Administered 2021-03-17: 25 ug/min via INTRAVENOUS
  Filled 2021-03-16: qty 250

## 2021-03-16 MED ORDER — CHLORHEXIDINE GLUCONATE 4 % EX LIQD
60.0000 mL | Freq: Once | CUTANEOUS | Status: AC
  Administered 2021-03-16: 4 via TOPICAL
  Filled 2021-03-16 (×2): qty 60

## 2021-03-16 MED ORDER — HEPARIN SODIUM (PORCINE) 1000 UNIT/ML IJ SOLN
INTRAMUSCULAR | Status: AC
  Filled 2021-03-16: qty 10

## 2021-03-16 MED ORDER — VERAPAMIL HCL 2.5 MG/ML IV SOLN
INTRAVENOUS | Status: AC
  Filled 2021-03-16: qty 2

## 2021-03-16 MED ORDER — VANCOMYCIN HCL 1500 MG/300ML IV SOLN
1500.0000 mg | INTRAVENOUS | Status: AC
  Administered 2021-03-17: 1500 mg via INTRAVENOUS
  Filled 2021-03-16: qty 300

## 2021-03-16 SURGICAL SUPPLY — 10 items
CATH 5FR JL3.5 JR4 ANG PIG MP (CATHETERS) ×1 IMPLANT
DEVICE RAD COMP TR BAND LRG (VASCULAR PRODUCTS) ×1 IMPLANT
GLIDESHEATH SLEND A-KIT 6F 22G (SHEATH) ×1 IMPLANT
GUIDEWIRE INQWIRE 1.5J.035X260 (WIRE) IMPLANT
INQWIRE 1.5J .035X260CM (WIRE) ×2
KIT HEART LEFT (KITS) ×2 IMPLANT
PACK CARDIAC CATHETERIZATION (CUSTOM PROCEDURE TRAY) ×2 IMPLANT
SHEATH PROBE COVER 6X72 (BAG) ×1 IMPLANT
TRANSDUCER W/STOPCOCK (MISCELLANEOUS) ×2 IMPLANT
TUBING CIL FLEX 10 FLL-RA (TUBING) ×2 IMPLANT

## 2021-03-16 NOTE — Progress Notes (Signed)
Pre cabg has been completed.   Preliminary results in CV Proc.   Brian Brewer Brian Brewer 03/16/2021 4:55 PM

## 2021-03-16 NOTE — Progress Notes (Signed)
Echocardiogram 2D Echocardiogram has been performed.  Jefferey Pica 03/16/2021, 9:49 AM

## 2021-03-16 NOTE — Anesthesia Preprocedure Evaluation (Addendum)
Anesthesia Evaluation  Patient identified by MRN, date of birth, ID band Patient awake    Reviewed: Allergy & Precautions, NPO status , Patient's Chart, lab work & pertinent test results  Airway Mallampati: II  TM Distance: >3 FB Neck ROM: Full    Dental  (+) Teeth Intact, Dental Advisory Given   Pulmonary Current Smoker and Patient abstained from smoking.,    Pulmonary exam normal breath sounds clear to auscultation       Cardiovascular hypertension, + CAD and + Past MI  Normal cardiovascular exam Rhythm:Regular Rate:Normal  Echo 03/26/21: 1. Left ventricular ejection fraction, by estimation, is 55 to 60%. The  left ventricle has normal function. The left ventricle demonstrates  regional wall motion abnormalities (see scoring diagram/findings for  description). There is moderate concentric  left ventricular hypertrophy. Left ventricular diastolic parameters were  normal.  2. Right ventricular systolic function is normal. The right ventricular  size is normal. Tricuspid regurgitation signal is inadequate for assessing  PA pressure.  3. The mitral valve is grossly normal. Mild mitral valve regurgitation.  No evidence of mitral stenosis.  4. The aortic valve is tricuspid. Aortic valve regurgitation is not  visualized. No aortic stenosis is present.  5. The inferior vena cava is normal in size with greater than 50%  respiratory variability, suggesting right atrial pressure of 3 mmHg.    Neuro/Psych negative neurological ROS  negative psych ROS   GI/Hepatic Neg liver ROS, GERD  Medicated,  Endo/Other  negative endocrine ROS  Renal/GU negative Renal ROS     Musculoskeletal  (+) Arthritis ,   Abdominal   Peds  Hematology negative hematology ROS (+)   Anesthesia Other Findings   Reproductive/Obstetrics                            Anesthesia Physical Anesthesia Plan  ASA: 4  Anesthesia  Plan: General   Post-op Pain Management:    Induction: Intravenous  PONV Risk Score and Plan: 1 and Midazolam and Treatment may vary due to age or medical condition  Airway Management Planned: Oral ETT  Additional Equipment: Arterial line, CVP, PA Cath, TEE and Ultrasound Guidance Line Placement  Intra-op Plan:   Post-operative Plan: Post-operative intubation/ventilation  Informed Consent: I have reviewed the patients History and Physical, chart, labs and discussed the procedure including the risks, benefits and alternatives for the proposed anesthesia with the patient or authorized representative who has indicated his/her understanding and acceptance.     Dental advisory given  Plan Discussed with: CRNA  Anesthesia Plan Comments:        Anesthesia Quick Evaluation

## 2021-03-16 NOTE — Progress Notes (Signed)
[place] for heparin Indication: chest pain/ACS  Allergies  Allergen Reactions   Amoxicillin-Pot Clavulanate Other (See Comments)    Cramps and diarrhea Has patient had a PCN reaction causing immediate rash, facial/tongue/throat swelling, SOB or lightheadedness with hypotension: No Has patient had a PCN reaction causing severe rash involving mucus membranes or skin necrosis: No Has patient had a PCN reaction that required hospitalization: No Has patient had a PCN reaction occurring within the last 10 years: No  If all of the above answers are "NO", then may proceed with Cephalosporin use.   Levofloxacin Other (See Comments)    Passed out (possibly due to medication)   Zithromax [Azithromycin] Diarrhea   Chantix [Varenicline Tartrate] Nausea And Vomiting   Oxycodone Diarrhea, Itching and Anxiety    Patient Measurements: Height: 5\' 9"  (175.3 cm) Weight: 92.8 kg (204 lb 8 oz) IBW/kg (Calculated) : 70.7 Heparin Dosing Weight: 91 kg  Vital Signs: Temp: 98.4 F (36.9 C) (02/14 1529) Temp Source: Oral (02/14 1529) BP: 138/90 (02/14 1600) Pulse Rate: 62 (02/14 1600)  Labs: Recent Labs    03/15/21 1715 03/15/21 1853 03/16/21 0119 03/16/21 0631 03/16/21 1201  HGB 15.7  --   --  15.5  --   HCT 45.8  --   --  43.7  --   PLT 272  --   --  258  --   APTT  --   --   --  >200*  --   LABPROT  --   --   --  12.5  --   INR  --   --   --  0.9  --   HEPARINUNFRC  --   --  0.58  --  0.51  CREATININE 1.07  --   --  1.12  --   TROPONINIHS 45* 48*  --   --   --      Estimated Creatinine Clearance: 84.8 mL/min (by C-G formula based on SCr of 1.12 mg/dL).  Assessment: 49 YOM with dyspnea on exertion and chest pain with suspected NSTEMI and started on IV heparin.  S/p cath with plan for CVTS evaluation.  Sheath removed at 1511 per procedural log.  Pharmacy consulted to resume IV heparin 8 hours post sheath removal.  Goal of Therapy:  Heparin  level 0.3-0.7 units/ml Monitor platelets by anticoagulation protocol: Yes   Plan:  At 2315, restart IV heparin at 1300 units/hr - no bolus post cath Check 6 hr heparin level Daily heparin level and CBC Monitor for s/sx of bleeding, hematoma  Brian Brewer, PharmD, BCPS, [place] 03/16/2021, 4:24 PM

## 2021-03-16 NOTE — Progress Notes (Signed)
Pt arrived back to 4e from cath lab. Vitals obtained. TR band on radial with 13cc air.

## 2021-03-16 NOTE — Progress Notes (Signed)
[place] for heparin Indication: chest pain/ACS  Allergies  Allergen Reactions   Amoxicillin-Pot Clavulanate Other (See Comments)    Cramps and diarrhea Has patient had a PCN reaction causing immediate rash, facial/tongue/throat swelling, SOB or lightheadedness with hypotension: No Has patient had a PCN reaction causing severe rash involving mucus membranes or skin necrosis: No Has patient had a PCN reaction that required hospitalization: No Has patient had a PCN reaction occurring within the last 10 years: No  If all of the above answers are "NO", then may proceed with Cephalosporin use.   Levofloxacin Other (See Comments)    Passed out (possibly due to medication)   Zithromax [Azithromycin] Diarrhea   Chantix [Varenicline Tartrate] Nausea And Vomiting   Oxycodone Diarrhea, Itching and Anxiety    Patient Measurements: Height: 5\' 9"  (175.3 cm) Weight: 92.8 kg (204 lb 8 oz) IBW/kg (Calculated) : 70.7 Heparin Dosing Weight: 91 kg  Vital Signs: Temp: 98.3 F (36.8 C) (02/14 0057) Temp Source: Oral (02/14 0057) BP: 156/93 (02/14 0101) Pulse Rate: 65 (02/14 0101)  Labs: Recent Labs    03/15/21 1715 03/15/21 1853  HGB 15.7  --   HCT 45.8  --   PLT 272  --   CREATININE 1.07  --   TROPONINIHS 45* 48*     Estimated Creatinine Clearance: 88.7 mL/min (by C-G formula based on SCr of 1.07 mg/dL).   Medical History: Past Medical History:  Diagnosis Date   Cancer ([place])    melanoma on the back was removed 10/2018   GERD (gastroesophageal reflux disease)    Gout    Hyperlipemia    Hypertension    Psoriatic arthritis ([place] ) 12/11/2013   Dr Amil Amen     Assessment: [age] M with dyspnea on exertion and chest pain with suspected NSTEMI. No known anticoagulation prior to admission at this time. Med rec is pending, no anticoagulants on available RX fill history or chart review.  Pharmacy consulted for heparin. H/H wnl, plt wnl.  2/14 AM  update:  Heparin level therapeutic at 0.58 Lab not crossing into Epic-showing in lab report  Goal of Therapy:  Heparin level 0.3-0.7 units/ml Monitor platelets by anticoagulation protocol: Yes   Plan:  Cont heparin at 1300 units/hr 1200 heparin level   Narda Bonds, PharmD, BCPS Clinical Pharmacist Phone: [phone]

## 2021-03-16 NOTE — Progress Notes (Addendum)
Progress Note  Patient Name: Brian Brewer Date of Encounter: 03/16/2021  [facility] Cardiologist: None   Subjective   No chest pain this morning  Inpatient Medications    Scheduled Meds:  allopurinol  100 mg Oral Daily   aspirin EC  81 mg Oral Daily   hydrochlorothiazide  12.5 mg Oral Daily   lisinopril  10 mg Oral Daily   metoprolol tartrate  12.5 mg Oral BID   pantoprazole  40 mg Oral Daily   rosuvastatin  40 mg Oral Daily   Continuous Infusions:  heparin 1,300 Units/hr (03/16/21 0306)   PRN Meds: acetaminophen, nicotine polacrilex, nitroGLYCERIN   Vital Signs    Vitals:   03/16/21 0057 03/16/21 0101 03/16/21 0413 03/16/21 0753  BP: (!) 156/93 (!) 156/93 (!) 144/85 (!) 151/87  Pulse: 61 65 72 64  Resp: 18 19 13 15   Temp: 98.3 F (36.8 C)  98.2 F (36.8 C) 98 F (36.7 C)  TempSrc: Oral  Oral Oral  SpO2: 97% 95% 96% 97%  Weight:  92.8 kg    Height:        Intake/Output Summary (Last 24 hours) at 03/16/2021 0811 Last data filed at 03/16/2021 0306 Gross per 24 hour  Intake 138.4 ml  Output --  Net 138.4 ml   Last 3 Weights 03/16/2021 03/15/2021 03/15/2021  Weight (lbs) 204 lb 8 oz 211 lb 6.7 oz 214 lb 15.2 oz  Weight (kg) 92.761 kg 95.9 kg 97.5 kg      Telemetry    SR--SB - Personally Reviewed  ECG    SR with ST elevation in inferior leads, biphasic anterior TW - Personally Reviewed  Physical Exam   GEN: No acute distress.   Neck: No JVD Cardiac: RRR, no murmurs, rubs, or gallops.  Respiratory: Clear to auscultation bilaterally. GI: Soft, nontender, non-distended  MS: No edema; No deformity. Neuro:  Nonfocal  Psych: Normal affect   Labs    High Sensitivity Troponin:   Recent Labs  Lab 03/15/21 1715 03/15/21 1853  TROPONINIHS 45* 48*     Chemistry Recent Labs  Lab 03/15/21 1715 03/16/21 0631  NA 137 138  K 3.6 3.9  CL 100 105  CO2 28 23  GLUCOSE 103* 109*  BUN 12 12  CREATININE 1.07 1.12  CALCIUM 9.4 9.3  MG  --  2.0   PROT 7.6  --   ALBUMIN 4.4  --   AST 28  --   ALT 41  --   ALKPHOS 54  --   BILITOT 0.3  --   GFRNONAA >60 >60  ANIONGAP 9 10    Lipids No results for input(s): CHOL, TRIG, HDL, LABVLDL, LDLCALC, CHOLHDL in the last 168 hours.  Hematology Recent Labs  Lab 03/15/21 1715 03/16/21 0631  WBC 10.3 8.5  RBC 5.09 5.00  HGB 15.7 15.5  HCT 45.8 43.7  MCV 90.0 87.4  MCH 30.8 31.0  MCHC 34.3 35.5  RDW 12.8 12.9  PLT 272 258   Thyroid  Recent Labs  Lab 03/16/21 0631  TSH 1.539    Lipid Panel     Component Value Date/Time   CHOL 149 01/18/2021 1510   TRIG 204 (H) 01/18/2021 1510   HDL 35 (L) 01/18/2021 1510   CHOLHDL 4.3 01/18/2021 1510   CHOLHDL 5.1 10/28/2017 0516   VLDL 34 10/28/2017 0516   LDLCALC 80 01/18/2021 1510   LABVLDL 34 01/18/2021 1510    BNPNo results for input(s): BNP, PROBNP in the  last 168 hours.  DDimer No results for input(s): DDIMER in the last 168 hours.   Radiology    DG Chest Port 1 View  Result Date: 03/15/2021 CLINICAL DATA:  Chest pain with abnormal EKG today. EXAM: PORTABLE CHEST 1 VIEW COMPARISON:  Chest two views 10/27/2016 FINDINGS: Cardiac silhouette and mediastinal contours are within normal limits with mild calcification seen within the aortic arch. The lungs are clear. No pulmonary edema, pleural effusion, or pneumothorax. Mild multilevel degenerative disc changes of the thoracic spine. IMPRESSION: No active disease. Electronically Signed   By: Yvonne Kendall M.D.   On: 03/15/2021 17:46    Cardiac Studies   Echo: pending   Patient Profile     56 y.o. male  with a PMH of HTN, HLD, tobacco abuse, GERD, psoriatic arthritis and OA who was seen 03/15/2021 for the evaluation of NSTEMI.  Assessment & Plan    NSTEMI: hsTn 45>>48, EKG was abnormal with slight ST elevation in leads II and III. Has had some exertional symptoms over the past several weeks. Planned for cardiac cath today -- continue IV heparin, ASA, statin, metoprolol and  lisinopril -- echo pending  Shared Decision Making/Informed Consent The risks [stroke (1 in 1000), death (1 in 1000), kidney failure [usually temporary] (1 in 500), bleeding (1 in 200), allergic reaction [possibly serious] (1 in 200)], benefits (diagnostic support and management of coronary artery disease) and alternatives of a cardiac catheterization were discussed in detail with Mr. Coury and he is willing to proceed.  HTN: blood pressures are elevated this morning, but has not received meds -- continue Metoprolol and lisinopril  HLD: lipid panel pending -- Crestor was increased to 40mg  daily   PA: on rinvoq PTA  Tobacco use: attempting to quit    For questions or updates, please contact [facility] Please consult [email or web address] for contact info under        Signed, Reino Bellis, NP  03/16/2021, 8:12 AM    Patient seen and examined. Agree with assessment and plan.  No recurrent chest pain.  Echo Doppler study done today shows normal LV function EF of 55 to 60% with moderate concentric left ventricular hypertrophy with normal diastolic parameters.  There is mild MR. He admits to a several week history of exertionally precipitated chest pressure.  He has strong family history for premature CAD with his father suffering an MI in his 11s.  The patient has an extensive tobacco history of approximately 40 pack years.  He has a history of hypertension, hyperlipidemia, and psoriatic arthritis.  Troponins are mildly positive and ECG shows T wave abnormality. I have reviewed the risks, indications, and alternatives to cardiac catheterization, possible angioplasty, and stenting with the patient. Risks include but are not limited to bleeding, infection, vascular injury, stroke, myocardial infection, arrhythmia, kidney injury, radiation-related injury in the case of prolonged fluoroscopy use, emergency cardiac surgery, and death. The patient understands the risks of serious complication is 1-2  in 6010 with diagnostic cardiac cath and 1-2% or less with angioplasty/stenting.  I am catheterization later today.   Troy Sine, MD, [facility] 03/16/2021 11:13 AM

## 2021-03-16 NOTE — Progress Notes (Signed)
Chaplain responded to Spiritual Consult to offer support to pt and family who are nervous about tomorrow's open heart surgery.  Chaplain followed a visit from pt's physician who had allayed a lot of fears.  Pt reported his pastor had been here to pray for him and would be here with pt's wife during surgery.  Chaplain rejoiced with pt that though he doesn't want to die he has a strong faith and knows "where he is going."  Chaplain and pt talked about the importance of having burning desire to come through this surgery for his family and how that will aid so much in his recovery.  Chaplain offered and pt accepted a visit just prior to pt being taken to surgery in the morning.    [place]

## 2021-03-16 NOTE — CV Procedure (Signed)
95% proximal LAD followed by an aneurysmal segment given origin to a large first diagonal.  The first diagonal contains proximal/ostial 70% narrowing.  The LAD is totally occluded after the origin of this diagonal.  The LAD fills via right to left collaterals. 40 to 50% mid circumflex 60% proximal RCA.  RCA is dominant and gives collaterals to the LAD. Normal left main EF 50 to 55% with EDP 23 mmHg consistent with acute on chronic diastolic heart failure  Likely the best approach will be LIMA to LAD and diagonal SVG plus minus RCA grafting.  The other alternative would be stenting the proximal LAD and then attempting CTO recanalization of the LAD.  I would favor LIMA to the LAD as the first/preferred treatment option.

## 2021-03-16 NOTE — H&P (View-Only) (Signed)
Progress Note  Patient Name: Brian Brewer Date of Encounter: 03/16/2021  [facility] Cardiologist: None   Subjective   No chest pain this morning  Inpatient Medications    Scheduled Meds:  allopurinol  100 mg Oral Daily   aspirin EC  81 mg Oral Daily   hydrochlorothiazide  12.5 mg Oral Daily   lisinopril  10 mg Oral Daily   metoprolol tartrate  12.5 mg Oral BID   pantoprazole  40 mg Oral Daily   rosuvastatin  40 mg Oral Daily   Continuous Infusions:  heparin 1,300 Units/hr (03/16/21 0306)   PRN Meds: acetaminophen, nicotine polacrilex, nitroGLYCERIN   Vital Signs    Vitals:   03/16/21 0057 03/16/21 0101 03/16/21 0413 03/16/21 0753  BP: (!) 156/93 (!) 156/93 (!) 144/85 (!) 151/87  Pulse: 61 65 72 64  Resp: 18 19 13 15   Temp: 98.3 F (36.8 C)  98.2 F (36.8 C) 98 F (36.7 C)  TempSrc: Oral  Oral Oral  SpO2: 97% 95% 96% 97%  Weight:  92.8 kg    Height:        Intake/Output Summary (Last 24 hours) at 03/16/2021 0811 Last data filed at 03/16/2021 0306 Gross per 24 hour  Intake 138.4 ml  Output --  Net 138.4 ml   Last 3 Weights 03/16/2021 03/15/2021 03/15/2021  Weight (lbs) 204 lb 8 oz 211 lb 6.7 oz 214 lb 15.2 oz  Weight (kg) 92.761 kg 95.9 kg 97.5 kg      Telemetry    SR--SB - Personally Reviewed  ECG    SR with ST elevation in inferior leads, biphasic anterior TW - Personally Reviewed  Physical Exam   GEN: No acute distress.   Neck: No JVD Cardiac: RRR, no murmurs, rubs, or gallops.  Respiratory: Clear to auscultation bilaterally. GI: Soft, nontender, non-distended  MS: No edema; No deformity. Neuro:  Nonfocal  Psych: Normal affect   Labs    High Sensitivity Troponin:   Recent Labs  Lab 03/15/21 1715 03/15/21 1853  TROPONINIHS 45* 48*     Chemistry Recent Labs  Lab 03/15/21 1715 03/16/21 0631  NA 137 138  K 3.6 3.9  CL 100 105  CO2 28 23  GLUCOSE 103* 109*  BUN 12 12  CREATININE 1.07 1.12  CALCIUM 9.4 9.3  MG  --  2.0   PROT 7.6  --   ALBUMIN 4.4  --   AST 28  --   ALT 41  --   ALKPHOS 54  --   BILITOT 0.3  --   GFRNONAA >60 >60  ANIONGAP 9 10    Lipids No results for input(s): CHOL, TRIG, HDL, LABVLDL, LDLCALC, CHOLHDL in the last 168 hours.  Hematology Recent Labs  Lab 03/15/21 1715 03/16/21 0631  WBC 10.3 8.5  RBC 5.09 5.00  HGB 15.7 15.5  HCT 45.8 43.7  MCV 90.0 87.4  MCH 30.8 31.0  MCHC 34.3 35.5  RDW 12.8 12.9  PLT 272 258   Thyroid  Recent Labs  Lab 03/16/21 0631  TSH 1.539    Lipid Panel     Component Value Date/Time   CHOL 149 01/18/2021 1510   TRIG 204 (H) 01/18/2021 1510   HDL 35 (L) 01/18/2021 1510   CHOLHDL 4.3 01/18/2021 1510   CHOLHDL 5.1 10/28/2017 0516   VLDL 34 10/28/2017 0516   LDLCALC 80 01/18/2021 1510   LABVLDL 34 01/18/2021 1510    BNPNo results for input(s): BNP, PROBNP in the  last 168 hours.  DDimer No results for input(s): DDIMER in the last 168 hours.   Radiology    DG Chest Port 1 View  Result Date: 03/15/2021 CLINICAL DATA:  Chest pain with abnormal EKG today. EXAM: PORTABLE CHEST 1 VIEW COMPARISON:  Chest two views 10/27/2016 FINDINGS: Cardiac silhouette and mediastinal contours are within normal limits with mild calcification seen within the aortic arch. The lungs are clear. No pulmonary edema, pleural effusion, or pneumothorax. Mild multilevel degenerative disc changes of the thoracic spine. IMPRESSION: No active disease. Electronically Signed   By: Yvonne Kendall M.D.   On: 03/15/2021 17:46    Cardiac Studies   Echo: pending   Patient Profile     56 y.o. male  with a PMH of HTN, HLD, tobacco abuse, GERD, psoriatic arthritis and OA who was seen 03/15/2021 for the evaluation of NSTEMI.  Assessment & Plan    NSTEMI: hsTn 45>>48, EKG was abnormal with slight ST elevation in leads II and III. Has had some exertional symptoms over the past several weeks. Planned for cardiac cath today -- continue IV heparin, ASA, statin, metoprolol and  lisinopril -- echo pending  Shared Decision Making/Informed Consent The risks [stroke (1 in 1000), death (1 in 1000), kidney failure [usually temporary] (1 in 500), bleeding (1 in 200), allergic reaction [possibly serious] (1 in 200)], benefits (diagnostic support and management of coronary artery disease) and alternatives of a cardiac catheterization were discussed in detail with Brian Brewer and he is willing to proceed.  HTN: blood pressures are elevated this morning, but has not received meds -- continue Metoprolol and lisinopril  HLD: lipid panel pending -- Crestor was increased to 40mg  daily   PA: on rinvoq PTA  Tobacco use: attempting to quit    For questions or updates, please contact [place] Please consult [email or web address] for contact info under        Signed, Reino Bellis, NP  03/16/2021, 8:12 AM    Patient seen and examined. Agree with assessment and plan.  No recurrent chest pain.  Echo Doppler study done today shows normal LV function EF of 55 to 60% with moderate concentric left ventricular hypertrophy with normal diastolic parameters.  There is mild MR. He admits to a several week history of exertionally precipitated chest pressure.  He has strong family history for premature CAD with his father suffering an MI in his 57s.  The patient has an extensive tobacco history of approximately 40 pack years.  He has a history of hypertension, hyperlipidemia, and psoriatic arthritis.  Troponins are mildly positive and ECG shows T wave abnormality. I have reviewed the risks, indications, and alternatives to cardiac catheterization, possible angioplasty, and stenting with the patient. Risks include but are not limited to bleeding, infection, vascular injury, stroke, myocardial infection, arrhythmia, kidney injury, radiation-related injury in the case of prolonged fluoroscopy use, emergency cardiac surgery, and death. The patient understands the risks of serious complication is 1-2  in 2353 with diagnostic cardiac cath and 1-2% or less with angioplasty/stenting.  I am catheterization later today.   Troy Sine, MD, [facility] 03/16/2021 11:13 AM

## 2021-03-16 NOTE — Interval H&P Note (Signed)
Cath Lab Visit (complete for each Cath Lab visit)  Clinical Evaluation Leading to the Procedure:   ACS: Yes.    Non-ACS:    Anginal Classification: CCS Brewer  Anti-ischemic medical therapy: Minimal Therapy (1 class of medications)  Non-Invasive Test Results: No non-invasive testing performed  Prior CABG: No previous CABG      History and Physical Interval Note:  03/16/2021 2:23 PM  Brian Brewer  has presented today for surgery, with the diagnosis of unstable angina.  The various methods of treatment have been discussed with the patient and family. After consideration of risks, benefits and other options for treatment, the patient has consented to  Procedure(s): LEFT HEART CATH AND CORONARY ANGIOGRAPHY (N/A) as a surgical intervention.  The patient's history has been reviewed, patient examined, no change in status, stable for surgery.  I have reviewed the patient's chart and labs.  Questions were answered to the patient's satisfaction.     Brian Brewer

## 2021-03-16 NOTE — Consult Note (Addendum)
[address]       [place] [address]             [phone]        Pheng M Tiede [place] Medical Record #[id number] Date of Birth: 03-25-1965  Referring Dr. Daneen Schick, MD Primary Care: Kathyrn Drown, MD Primary Cardiologist:New to [facility].  Chief Complaint:    Chief Complaint  Patient presents with   Chest Pain  Reason for consultation: Coronary artery disease  History of Present Illness:     This is a 56 year old male with a past medical history of hyperlipidemia, hypertension, tobacco abuse, family history (father had heart attack in his 24's and his grandmother as well), GERD, melanoma (on back), and psoriatic arthritis (on Rinvoq) who had COVID about 8 weeks ago. Patient had mostly upper respiratory symptoms and cough. He was then chopping wood when he developed chest discomfort and shortness of breath. He thought this might be from recent COVID infection. On 03/15/2021 he was picking sticks up in the yard when he had severe, burning like chest pain and shortness of breath. He denied syncope, nausea, LE edema, or abdominal pain. He initially presented to [place]. EKG was done and results indicated possible cardiac ischemia. He was then instructed to go to [facility] ED for further evaluation and treatment. EKG showed TWI in aVL and sub millimeter STE in leads II and III. His initial Troponin was 45. He ruled in for a NSTEMI. He was put on a Heparin drip and transferred to [facility]. He underwent a cardiac catheterization today which showed 95% ostial to proximal LAD stenosis just before the origin of the first Diagonal, chronic total occlusion of the LAD after the first Diagonal, 40-50% OM2, and RCA with 60-70% eccentric proximal stenosis. Dr. Prescott Gum has been consulted for the consideration of coronary artery bypass grafting surgery. At the time of my exam, his wife, daughter, and son were at bedside. Patient denies chest  pain or shortness of breath. Vital signs are HR 82, BP 134/82.   Current Activity/ Functional Status: Patient is independent with mobility/ambulation, transfers, ADL's, IADL's.   Zubrod Score: At the time of surgery this patients most appropriate activity status/level should be described as: []     0    Normal activity, no symptoms [x]     1    Restricted in physical strenuous activity but ambulatory, able to do out light work []     2    Ambulatory and capable of self care, unable to do work activities, up and about more than 50%  Of the time                            []     3    Only limited self care, in bed greater than 50% of waking hours []     4    Completely disabled, no self care, confined to bed or chair []     5    Moribund  Past Medical History:  Diagnosis Date   Cancer ([place])    melanoma on the back was removed 10/2018   GERD (gastroesophageal reflux disease)    Gout    Hyperlipemia    Hypertension    Psoriatic arthritis ([place]) 12/11/2013   Dr Amil Amen     Past Surgical History:  Procedure Laterality Date   BACK SURGERY  2003   L4,5,S1  Fusion   CHOLECYSTECTOMY     COLONOSCOPY N/A 12/29/2016   Procedure: COLONOSCOPY;  Surgeon: Daneil Dolin, MD;  Location: [facility] SUITE;  Service: Endoscopy;  Laterality: N/A;  9:15 AM   LUMBAR FUSION     3 yrs ago-[place]   TOTAL HIP ARTHROPLASTY Right 02/15/2019   Procedure: RIGHT TOTAL HIP ARTHROPLASTY ANTERIOR APPROACH;  Surgeon: Mcarthur Rossetti, MD;  Location: [facility] ORS;  Service: Orthopedics;  Laterality: Right;    Social History   Tobacco Use  Smoking Status Every Day   Packs/day: 0.50   Years: 30.00   Pack years: 10.00   Types: Cigarettes  Smokeless Tobacco Never    Social History   Substance and Sexual Activity  Alcohol Use No  Patient works for a [employer], clinical (histocompatibility and immunogenetics). He is married and has a son and a daughter.  Allergies: Allergies  Allergen Reactions   Amoxicillin-Pot Clavulanate Other (See Comments)     Cramps and diarrhea Has patient had a PCN reaction causing immediate rash, facial/tongue/throat swelling, SOB or lightheadedness with hypotension: No Has patient had a PCN reaction causing severe rash involving mucus membranes or skin necrosis: No Has patient had a PCN reaction that required hospitalization: No Has patient had a PCN reaction occurring within the last 10 years: No  If all of the above answers are "NO", then may proceed with Cephalosporin use.   Levofloxacin Other (See Comments)    Passed out (possibly due to medication)   Zithromax [Azithromycin] Diarrhea   Chantix [Varenicline Tartrate] Nausea And Vomiting   Oxycodone Diarrhea, Itching and Anxiety    Current Facility-Administered Medications  Medication Dose Route Frequency Provider Last Rate Last Admin   acetaminophen (TYLENOL) tablet 650 mg  650 mg Oral Q4H PRN Belva Crome, MD   650 mg at 03/16/21 0839   allopurinol (ZYLOPRIM) tablet 100 mg  100 mg Oral Daily Belva Crome, MD   100 mg at 03/16/21 0839   ALPRAZolam (XANAX) tablet 0.25-0.5 mg  0.25-0.5 mg Oral Q4H PRN Dahlia Byes, MD       aspirin EC tablet 81 mg  81 mg Oral Daily Belva Crome, MD   81 mg at 03/16/21 2229   bisacodyl (DULCOLAX) EC tablet 5 mg  5 mg Oral Once Dahlia Byes, MD       Derrill Memo ON 03/17/2021] ceFAZolin (ANCEF) IVPB 2g/100 mL premix  2 g Intravenous To OR Dahlia Byes, MD       Derrill Memo ON 03/17/2021] ceFAZolin (ANCEF) IVPB 2g/100 mL premix  2 g Intravenous To OR Dahlia Byes, MD       chlorhexidine (HIBICLENS) 4 % liquid 4 application  60 mL Topical Once Dahlia Byes, MD       And   Derrill Memo ON 03/17/2021] chlorhexidine (HIBICLENS) 4 % liquid 4 application  60 mL Topical Once Dahlia Byes, MD       Derrill Memo ON 03/17/2021] chlorhexidine (PERIDEX) 0.12 % solution 15 mL  15 mL Mouth/Throat Once Dahlia Byes, MD       Derrill Memo ON 03/17/2021] dexmedetomidine (PRECEDEX) 400 MCG/100ML (4 mcg/mL) infusion  0.1-0.7 mcg/kg/hr Intravenous To  OR Dahlia Byes, MD       Derrill Memo ON 03/17/2021] diazepam (VALIUM) tablet 5 mg  5 mg Oral Once Dahlia Byes, MD       Derrill Memo ON 03/17/2021] EPINEPHrine (ADRENALIN) 5 mg in NS 250 mL (0.02 mg/mL) premix infusion  0-10 mcg/min Intravenous To OR Dahlia Byes, MD       fluticasone furoate-vilanterol (  BREO ELLIPTA) 200-25 MCG/ACT 1 puff  1 puff Inhalation Daily Dahlia Byes, MD       guaiFENesin (MUCINEX) 12 hr tablet 600 mg  600 mg Oral BID Dahlia Byes, MD       Derrill Memo ON 03/17/2021] heparin 30,000 units/NS 1000 mL solution for CELLSAVER   Other To OR Dahlia Byes, MD       heparin ADULT infusion 100 units/mL (25000 units/234mL)  1,300 Units/hr Intravenous Continuous Dang, Thuy D, [facility]       [START ON 03/17/2021] heparin sodium (porcine) 2,500 Units, papaverine 30 mg in electrolyte-A (PLASMALYTE-A PH 7.4) 500 mL irrigation   Irrigation To OR Dahlia Byes, MD       Derrill Memo ON 03/17/2021] insulin regular, human (MYXREDLIN) 100 units/ 100 mL infusion   Intravenous To OR Dahlia Byes, MD       lisinopril (ZESTRIL) tablet 10 mg  10 mg Oral Daily Belva Crome, MD   10 mg at 03/16/21 0840   [START ON 03/17/2021] magnesium sulfate (IV Push/IM) injection 40 mEq  40 mEq Other To OR Dahlia Byes, MD       metoprolol tartrate (LOPRESSOR) tablet 12.5 mg  12.5 mg Oral BID Belva Crome, MD   12.5 mg at 03/16/21 0839   [START ON 03/17/2021] metoprolol tartrate (LOPRESSOR) tablet 12.5 mg  12.5 mg Oral Once Dahlia Byes, MD       Derrill Memo ON 03/17/2021] milrinone (PRIMACOR) 20 MG/100 ML (0.2 mg/mL) infusion  0.3 mcg/kg/min Intravenous To OR Dahlia Byes, MD       nicotine polacrilex (NICORETTE) gum 2 mg  2 mg Oral PRN Belva Crome, MD       nitroGLYCERIN (NITROSTAT) SL tablet 0.4 mg  0.4 mg Sublingual Q5 Min x 3 PRN Belva Crome, MD       [START ON 03/17/2021] nitroGLYCERIN 50 mg in dextrose 5 % 250 mL (0.2 mg/mL) infusion  2-200 mcg/min Intravenous To OR Dahlia Byes, MD       Derrill Memo ON  03/17/2021] norepinephrine (LEVOPHED) 4mg  in 226mL (0.016 mg/mL) premix infusion  0-40 mcg/min Intravenous To OR Dahlia Byes, MD       pantoprazole (PROTONIX) EC tablet 40 mg  40 mg Oral Daily Belva Crome, MD   40 mg at 03/16/21 0839   [START ON 03/17/2021] phenylephrine (NEO-SYNEPHRINE) 20mg /NS 21mL premix infusion  30-200 mcg/min Intravenous To OR Dahlia Byes, MD       Derrill Memo ON 03/17/2021] potassium chloride injection 80 mEq  80 mEq Other To OR Dahlia Byes, MD       rosuvastatin (CRESTOR) tablet 40 mg  40 mg Oral Daily Belva Crome, MD   40 mg at 03/16/21 2440   sodium chloride flush (NS) 0.9 % injection 3 mL  3 mL Intravenous Q12H Belva Crome, MD   3 mL at 03/16/21 0842   temazepam (RESTORIL) capsule 15 mg  15 mg Oral Once PRN Dahlia Byes, MD       Derrill Memo ON 03/17/2021] tranexamic acid (CYKLOKAPRON) 2,500 mg in sodium chloride 0.9 % 250 mL (10 mg/mL) infusion  1.5 mg/kg/hr Intravenous To OR Dahlia Byes, MD       Derrill Memo ON 03/17/2021] tranexamic acid (CYKLOKAPRON) bolus via infusion - over 30 minutes 1,392 mg  15 mg/kg Intravenous To OR Dahlia Byes, MD       Derrill Memo ON 03/17/2021] tranexamic acid (CYKLOKAPRON) pump prime solution 186 mg  2 mg/kg Intracatheter To OR Dahlia Byes, MD       [  START ON 03/17/2021] vancomycin (VANCOREADY) IVPB 1500 mg/300 mL  1,500 mg Intravenous To OR Dahlia Byes, MD        Medications Prior to Admission  Medication Sig Dispense Refill Last Dose   allopurinol (ZYLOPRIM) 100 MG tablet TAKE ONE TABLET BY MOUTH ONCE DAILY. (Patient taking differently: Take 100 mg by mouth daily. TAKE ONE TABLET BY MOUTH ONCE DAILY.) 90 tablet 1 03/15/2021   esomeprazole (NEXIUM) 40 MG capsule Take 1 capsule (40 mg total) by mouth daily. 90 capsule 1 03/15/2021   ipratropium (ATROVENT) 0.03 % nasal spray Place 2 sprays into both nostrils 3 (three) times daily.   Past Week   lisinopril-hydrochlorothiazide (ZESTORETIC) 10-12.5 MG tablet Take 1 tablet by mouth  daily. 90 tablet 1 03/15/2021   RINVOQ 15 MG TB24 Take 1 tablet by mouth daily.   03/14/2021   rosuvastatin (CRESTOR) 20 MG tablet Take 1 tablet (20 mg total) by mouth daily. 90 tablet 1 03/14/2021   varenicline (CHANTIX CONTINUING MONTH PAK) 1 MG tablet Take one tablet po BID (Patient not taking: Reported on 03/15/2021) 60 tablet 3 Not Taking   Varenicline Tartrate, Starter, (CHANTIX STARTING MONTH PAK) 0.5 MG X 11 & 1 MG X 42 TBPK Take 0.5 mg by mouth daily. Day 1-3 take 0.5 mg po daily; day 4-7 0.5 mg po BID. Day 8-31 1 mg po daily (Patient not taking: Reported on 03/15/2021) 7 each 0 Not Taking    Family History  Problem Relation Age of Onset   Heart disease Father    Hypertension Father    Leukemia Father    Anesthesia problems Neg Hx    Hypotension Neg Hx    Malignant hyperthermia Neg Hx    Pseudochol deficiency Neg Hx    Review of Systems:      Cardiac Review of Systems: Y or  [ N   ]= no  Chest Pain [  Y-on admission  ]  Resting SOB [  N ] Exertional SOB  [ Y ]  Orthopnea [ N ]   Pedal Edema [  N ]    Syncope  [  N]   Presyncope [  N ]  General Review of Systems: [Y] = yes [ N ]=no Constitional:  nausea [ N ]; night sweats [ N ]; fever [ N ]; or chills [  N]                                                               Dental: Last Dentist visit: About 2 weeks ago   Eye :  Amaurosis fugax[ N ]; Resp: cough [ N ];  wheezing[  N];  hemoptysis[ N ];  GI: vomiting[  N];   melena[ N ];  hematochezia [  ]; h GU: hematuria[ N ];                Skin: rash, swelling[  N];,   peripheral edema[ N ];    Heme/Lymph: bruising[  ];  bleeding[  ];  anemia[  ];  Neuro: Sharlene.Ates  ];  stroke[ N ]; seizures [ N ]; difficulty walking[ N ];  Endocrine: diabetes[ N ];  thyroid dysfunction[ N ];  Physical Exam: BP 132/82    Pulse 70    Temp 98.4 F (36.9 C) (Oral)    Resp 14    Ht 5\' 9"  (1.753 m)    Wt 92.8 kg    SpO2 96%    BMI 30.20 kg/m    General appearance: alert,  cooperative, and no distress Head: Normocephalic, without obvious abnormality, atraumatic Neck: no carotid bruit, no JVD, and supple, symmetrical, trachea midline Resp: clear to auscultation bilaterally Cardio: RRR, no murmur GI: Soft, non tender, bowel sounds present, large well healed scar across RUQ from cholecystectomy Extremities: No LE edema. Palpable pulses bilaterally Neurologic: Grossly normal  Diagnostic Studies & Laboratory data:     Recent Radiology Findings:   CARDIAC CATHETERIZATION  Result Date: 03/16/2021 CONCLUSIONS: Ostial to proximal LAD 95% stenosis before the origin of diagonal #1.  Chronic total occlusion of the LAD after the first diagonal.  First diagonal contains segmental 50% narrowing.  Widely patent left main. 40 to 50% second obtuse marginal. Dominant RCA with 60-70 % eccentric proximal stenosis.  The vessel is dominant and supplies right to left collaterals to LAD.  The LAD appears to be a large target that is graftable. RECOMMENDATIONS: Revascularization is needed.  Best long-term approach would be LIMA to LAD and saphenous vein graft to diagonal plus minus RCA.  There could potentially be a percutaneous approach although it would include a chronic total occlusion bifurcation stenosis in the proximal to mid LAD. Recommend surgical evaluation and interventional opinions from team.  Diagnostic Dominance: Right Intervention      DG Chest Port 1 View  Result Date: 03/15/2021 CLINICAL DATA:  Chest pain with abnormal EKG today. EXAM: PORTABLE CHEST 1 VIEW COMPARISON:  Chest two views 10/27/2016 FINDINGS: Cardiac silhouette and mediastinal contours are within normal limits with mild calcification seen within the aortic arch. The lungs are clear. No pulmonary edema, pleural effusion, or pneumothorax. Mild multilevel degenerative disc changes of the thoracic spine. IMPRESSION: No active disease. Electronically Signed   By: Yvonne Kendall M.D.   On: 03/15/2021 17:46    ECHOCARDIOGRAM COMPLETE  Result Date: 03/16/2021    ECHOCARDIOGRAM REPORT   Patient Name:   Brian Brewer Date of Exam: 03/16/2021 Medical Rec #:  [id number]      Height:       69.0 in Accession #:    [id number]     Weight:       204.5 lb Date of Birth:  11/12/1965      BSA:          2.086 m Patient Age:    48 years       BP:           151/87 mmHg Patient Gender: M              HR:           54 bpm. Exam Location:  Inpatient Procedure: 2D Echo Indications:    Nstemi  History:        Patient has prior history of Echocardiogram examinations, most                 recent 03/30/2012. Risk Factors:Hypertension.  Sonographer:    Jefferey Pica Referring Phys: [id number] [place]  1. Left ventricular ejection fraction, by estimation, is 55 to 60%. The left ventricle has normal function. The left ventricle demonstrates regional wall motion abnormalities (see scoring diagram/findings for description). There is moderate concentric left ventricular hypertrophy. Left ventricular diastolic  parameters were normal.  2. Right ventricular systolic function is normal. The right ventricular size is normal. Tricuspid regurgitation signal is inadequate for assessing PA pressure.  3. The mitral valve is grossly normal. Mild mitral valve regurgitation. No evidence of mitral stenosis.  4. The aortic valve is tricuspid. Aortic valve regurgitation is not visualized. No aortic stenosis is present.  5. The inferior vena cava is normal in size with greater than 50% respiratory variability, suggesting right atrial pressure of 3 mmHg. FINDINGS  Left Ventricle: Left ventricular ejection fraction, by estimation, is 55 to 60%. The left ventricle has normal function. The left ventricle demonstrates regional wall motion abnormalities. The left ventricular internal cavity size was normal in size. There is moderate concentric left ventricular hypertrophy. Left ventricular diastolic parameters were normal.  LV Wall Scoring: The  apical septal segment and apex are hypokinetic. Right Ventricle: The right ventricular size is normal. No increase in right ventricular wall thickness. Right ventricular systolic function is normal. Tricuspid regurgitation signal is inadequate for assessing PA pressure. Left Atrium: Left atrial size was normal in size. Right Atrium: Right atrial size was normal in size. Pericardium: There is no evidence of pericardial effusion. Mitral Valve: The mitral valve is grossly normal. Mild mitral valve regurgitation. No evidence of mitral valve stenosis. Tricuspid Valve: The tricuspid valve is grossly normal. Tricuspid valve regurgitation is trivial. No evidence of tricuspid stenosis. Aortic Valve: The aortic valve is tricuspid. Aortic valve regurgitation is not visualized. No aortic stenosis is present. Aortic valve peak gradient measures 2.7 mmHg. Pulmonic Valve: The pulmonic valve was grossly normal. Pulmonic valve regurgitation is trivial. No evidence of pulmonic stenosis. Aorta: The aortic root and ascending aorta are structurally normal, with no evidence of dilitation. Venous: The inferior vena cava is normal in size with greater than 50% respiratory variability, suggesting right atrial pressure of 3 mmHg. IAS/Shunts: The atrial septum is grossly normal.  LEFT VENTRICLE PLAX 2D LVIDd:         4.30 cm      Diastology LVIDs:         2.30 cm      LV e' medial:    7.28 cm/s LV PW:         1.40 cm      LV E/e' medial:  11.1 LV IVS:        1.40 cm      LV e' lateral:   8.52 cm/s LVOT diam:     2.10 cm      LV E/e' lateral: 9.5 LV SV:         60 LV SV Index:   29 LVOT Area:     3.46 cm  LV Volumes (MOD) LV vol d, MOD A4C: 164.0 ml LV vol s, MOD A4C: 88.9 ml LV SV MOD A4C:     164.0 ml RIGHT VENTRICLE             IVC RV Basal diam:  2.60 cm     IVC diam: 2.00 cm RV S prime:     10.10 cm/s TAPSE (M-mode): 1.8 cm LEFT ATRIUM             Index        RIGHT ATRIUM           Index LA diam:        3.80 cm 1.82 cm/m   RA Area:      10.80 cm LA Vol (A2C):   49.5 ml 23.73 ml/m  RA  Volume:   22.70 ml  10.88 ml/m LA Vol (A4C):   36.8 ml 17.64 ml/m LA Biplane Vol: 42.8 ml 20.52 ml/m  AORTIC VALVE                 PULMONIC VALVE AV Area (Vmax): 3.01 cm     PV Vmax:       0.52 m/s AV Vmax:        81.95 cm/s   PV Peak grad:  1.1 mmHg AV Peak Grad:   2.7 mmHg LVOT Vmax:      71.10 cm/s LVOT Vmean:     45.900 cm/s LVOT VTI:       0.172 m  AORTA Ao Root diam: 3.60 cm Ao Asc diam:  3.60 cm MITRAL VALVE MV Area (PHT): 4.60 cm    SHUNTS MV Decel Time: 165 msec    Systemic VTI:  0.17 m MV E velocity: 80.60 cm/s  Systemic Diam: 2.10 cm MV A velocity: 62.10 cm/s MV E/A ratio:  1.30 Eleonore Chiquito MD Electronically signed by Eleonore Chiquito MD Signature Date/Time: 03/16/2021/10:21:04 AM    Final      I have independently reviewed the above radiologic studies and discussed with the patient   Recent Lab Findings: Lab Results  Component Value Date   WBC 8.5 03/16/2021   HGB 15.5 03/16/2021   HCT 43.7 03/16/2021   PLT 258 03/16/2021   GLUCOSE 109 (H) 03/16/2021   CHOL 149 01/18/2021   TRIG 204 (H) 01/18/2021   HDL 35 (L) 01/18/2021   LDLCALC 80 01/18/2021   ALT 41 03/15/2021   AST 28 03/15/2021   NA 138 03/16/2021   K 3.9 03/16/2021   CL 105 03/16/2021   CREATININE 1.12 03/16/2021   BUN 12 03/16/2021   CO2 23 03/16/2021   TSH 1.539 03/16/2021   INR 0.9 03/16/2021   HGBA1C 5.8 (H) 01/18/2021   Assessment / Plan:   S/p NSTEMI, coronary artery disease-on Heparin drip. He would likely benefit from coronary revascularization. Echo and carotid duple [place] ordered. Dr. Prescott Gum to evaluate this evening and provide his recommendation.  History of hypertension-on Lopressor 12.5 mg bid and Lisinopril 10 mg daily 3. History of hyperlipidemia-on Crestor 40 mg daily 4. History of gout-on Allopurinol 5. History of psoriatic arthritis-on Rinvoq prior to admission 6. History of GERD-on Nexium prior to admission 7. History of tobacco abuse-to on  Chantix by PCP  I  spent 25 minutes counseling the patient face to face.   Lars Pinks PA-C 03/16/2021 4:40 PM  Patient examined, images of both the coronary arteriogram and echocardiogram personally reviewed and counseled with patient and wife.  52 year old previously healthy non-diabetic smoker presents with recently diagnosed non-STEMI and significant multivessel coronary disease with preserved LV function.  He has been recommended for consideration of coronary bypass grafting by his cardiologist and I agree that that would be the best long-term therapy for his coronary artery disease.  I have discussed the procedure of CABG in detail with the patient and his wife including the plan to graft the LAD [left IMA], large dominant RCA[saphenous vein], and the first diagonal[saphenous vein].  The patient understands the expected benefits of the surgery as well as the associated risks.  He will need to stop his venous suppressive drug for psoriatic arthritis postoperatively to allow healing.  He knows importance of pulmonary physiotherapy to keep his lungs clear with a heavy smoking history.  Plan on proceeding with surgery in the a.m.   patient  examined and medical record reviewed,agree with above note by Josie Saunders. Dahlia Byes 03/16/2021

## 2021-03-16 NOTE — Progress Notes (Signed)
[place] for heparin Indication: chest pain/ACS  Allergies  Allergen Reactions   Amoxicillin-Pot Clavulanate Other (See Comments)    Cramps and diarrhea Has patient had a PCN reaction causing immediate rash, facial/tongue/throat swelling, SOB or lightheadedness with hypotension: No Has patient had a PCN reaction causing severe rash involving mucus membranes or skin necrosis: No Has patient had a PCN reaction that required hospitalization: No Has patient had a PCN reaction occurring within the last 10 years: No  If all of the above answers are "NO", then may proceed with Cephalosporin use.   Levofloxacin Other (See Comments)    Passed out (possibly due to medication)   Zithromax [Azithromycin] Diarrhea   Chantix [Varenicline Tartrate] Nausea And Vomiting   Oxycodone Diarrhea, Itching and Anxiety    Patient Measurements: Height: 5\' 9"  (175.3 cm) Weight: 92.8 kg (204 lb 8 oz) IBW/kg (Calculated) : 70.7 Heparin Dosing Weight: 91 kg  Vital Signs: Temp: 98.2 F (36.8 C) (02/14 1149) Temp Source: Oral (02/14 1149) BP: 129/77 (02/14 1149) Pulse Rate: 50 (02/14 1149)  Labs: Recent Labs    03/15/21 1715 03/15/21 1853 03/16/21 0119 03/16/21 0631 03/16/21 1201  HGB 15.7  --   --  15.5  --   HCT 45.8  --   --  43.7  --   PLT 272  --   --  258  --   APTT  --   --   --  >200*  --   LABPROT  --   --   --  12.5  --   INR  --   --   --  0.9  --   HEPARINUNFRC  --   --  0.58  --  0.51  CREATININE 1.07  --   --  1.12  --   TROPONINIHS 45* 48*  --   --   --      Estimated Creatinine Clearance: 84.8 mL/min (by C-G formula based on SCr of 1.12 mg/dL).   Medical History: Past Medical History:  Diagnosis Date   Cancer ([place])    melanoma on the back was removed 10/2018   GERD (gastroesophageal reflux disease)    Gout    Hyperlipemia    Hypertension    Psoriatic arthritis ([place]) 12/11/2013   Dr Amil Amen     Assessment: [age] M with  dyspnea on exertion and chest pain with suspected NSTEMI. No known anticoagulation prior to admission at this time. No anticoagulants on available RX fill history or chart review.  Pharmacy consulted for heparin.   Heparin level came back therapeutic at 0.51, on 1300 units/hr. No s/sx of bleeding or infusion issues. CBC stable.   Goal of Therapy:  Heparin level 0.3-0.7 units/ml Monitor platelets by anticoagulation protocol: Yes   Plan:  Cont heparin at 1300 units/hr F/u plans for cath Monitor daily HL, CBC, and for s/sx of bleeding   Antonietta Jewel, PharmD, BCCCP Clinical Pharmacist  Phone: [phone] 03/16/2021 1:12 PM  Please check AMION for all [facility] phone numbers After 10:00 PM, call [place] [phone]

## 2021-03-17 ENCOUNTER — Encounter (HOSPITAL_COMMUNITY): Payer: Self-pay | Admitting: Interventional Cardiology

## 2021-03-17 ENCOUNTER — Encounter (HOSPITAL_COMMUNITY)
Admission: EM | Disposition: A | Discharge status: Home / Self Care | Payer: Self-pay | Source: Home / Self Care | Attending: Cardiothoracic Surgery

## 2021-03-17 ENCOUNTER — Inpatient Hospital Stay (HOSPITAL_COMMUNITY): Payer: Commercial Managed Care - PPO | Admitting: Anesthesiology

## 2021-03-17 ENCOUNTER — Inpatient Hospital Stay (HOSPITAL_COMMUNITY): Payer: Commercial Managed Care - PPO

## 2021-03-17 DIAGNOSIS — Z951 Presence of aortocoronary bypass graft: Secondary | ICD-10-CM

## 2021-03-17 DIAGNOSIS — I2510 Atherosclerotic heart disease of native coronary artery without angina pectoris: Secondary | ICD-10-CM

## 2021-03-17 DIAGNOSIS — I252 Old myocardial infarction: Secondary | ICD-10-CM

## 2021-03-17 DIAGNOSIS — I10 Essential (primary) hypertension: Secondary | ICD-10-CM

## 2021-03-17 HISTORY — PX: CORONARY ARTERY BYPASS GRAFT: SHX141

## 2021-03-17 HISTORY — PX: TEE WITHOUT CARDIOVERSION: SHX5443

## 2021-03-17 LAB — POCT I-STAT 7, (LYTES, BLD GAS, ICA,H+H)
Acid-Base Excess: 0 mmol/L (ref 0.0–2.0)
Acid-Base Excess: 0 mmol/L (ref 0.0–2.0)
Acid-Base Excess: 0 mmol/L (ref 0.0–2.0)
Acid-base deficit: 2 mmol/L (ref 0.0–2.0)
Acid-base deficit: 2 mmol/L (ref 0.0–2.0)
Acid-base deficit: 3 mmol/L — ABNORMAL HIGH (ref 0.0–2.0)
Acid-base deficit: 3 mmol/L — ABNORMAL HIGH (ref 0.0–2.0)
Acid-base deficit: 3 mmol/L — ABNORMAL HIGH (ref 0.0–2.0)
Acid-base deficit: 4 mmol/L — ABNORMAL HIGH (ref 0.0–2.0)
Acid-base deficit: 2 mmol/L (ref 0.0–2.0)
Acid-base deficit: 1 mmol/L (ref 0.0–2.0)
Bicarbonate: 24.8 mmol/L (ref 20.0–28.0)
Bicarbonate: 25.4 mmol/L (ref 20.0–28.0)
Bicarbonate: 26 mmol/L (ref 20.0–28.0)
Bicarbonate: 22.9 mmol/L (ref 20.0–28.0)
Bicarbonate: 24.4 mmol/L (ref 20.0–28.0)
Bicarbonate: 23.3 mmol/L (ref 20.0–28.0)
Bicarbonate: 25.4 mmol/L (ref 20.0–28.0)
Bicarbonate: 22.2 mmol/L (ref 20.0–28.0)
Bicarbonate: 24.1 mmol/L (ref 20.0–28.0)
Bicarbonate: 23.2 mmol/L (ref 20.0–28.0)
Bicarbonate: 22.5 mmol/L (ref 20.0–28.0)
Calcium, Ion: 1.3 mmol/L (ref 1.15–1.40)
Calcium, Ion: 1.05 mmol/L — ABNORMAL LOW (ref 1.15–1.40)
Calcium, Ion: 1.11 mmol/L — ABNORMAL LOW (ref 1.15–1.40)
Calcium, Ion: 1.09 mmol/L — ABNORMAL LOW (ref 1.15–1.40)
Calcium, Ion: 1.11 mmol/L — ABNORMAL LOW (ref 1.15–1.40)
Calcium, Ion: 1.1 mmol/L — ABNORMAL LOW (ref 1.15–1.40)
Calcium, Ion: 1.17 mmol/L (ref 1.15–1.40)
Calcium, Ion: 1.16 mmol/L (ref 1.15–1.40)
Calcium, Ion: 1.17 mmol/L (ref 1.15–1.40)
Calcium, Ion: 1.14 mmol/L — ABNORMAL LOW (ref 1.15–1.40)
Calcium, Ion: 1.11 mmol/L — ABNORMAL LOW (ref 1.15–1.40)
HCT: 43 % (ref 39.0–52.0)
HCT: 32 % — ABNORMAL LOW (ref 39.0–52.0)
HCT: 32 % — ABNORMAL LOW (ref 39.0–52.0)
HCT: 33 % — ABNORMAL LOW (ref 39.0–52.0)
HCT: 33 % — ABNORMAL LOW (ref 39.0–52.0)
HCT: 33 % — ABNORMAL LOW (ref 39.0–52.0)
HCT: 34 % — ABNORMAL LOW (ref 39.0–52.0)
HCT: 36 % — ABNORMAL LOW (ref 39.0–52.0)
HCT: 37 % — ABNORMAL LOW (ref 39.0–52.0)
HCT: 33 % — ABNORMAL LOW (ref 39.0–52.0)
HCT: 34 % — ABNORMAL LOW (ref 39.0–52.0)
Hemoglobin: 14.6 g/dL (ref 13.0–17.0)
Hemoglobin: 10.9 g/dL — ABNORMAL LOW (ref 13.0–17.0)
Hemoglobin: 10.9 g/dL — ABNORMAL LOW (ref 13.0–17.0)
Hemoglobin: 11.2 g/dL — ABNORMAL LOW (ref 13.0–17.0)
Hemoglobin: 11.2 g/dL — ABNORMAL LOW (ref 13.0–17.0)
Hemoglobin: 11.2 g/dL — ABNORMAL LOW (ref 13.0–17.0)
Hemoglobin: 11.6 g/dL — ABNORMAL LOW (ref 13.0–17.0)
Hemoglobin: 12.2 g/dL — ABNORMAL LOW (ref 13.0–17.0)
Hemoglobin: 12.6 g/dL — ABNORMAL LOW (ref 13.0–17.0)
Hemoglobin: 11.2 g/dL — ABNORMAL LOW (ref 13.0–17.0)
Hemoglobin: 11.6 g/dL — ABNORMAL LOW (ref 13.0–17.0)
O2 Saturation: 100 %
O2 Saturation: 100 %
O2 Saturation: 100 %
O2 Saturation: 100 %
O2 Saturation: 100 %
O2 Saturation: 94 %
O2 Saturation: 97 %
O2 Saturation: 97 %
O2 Saturation: 97 %
O2 Saturation: 97 %
O2 Saturation: 93 %
Patient temperature: 35.9
Patient temperature: 36.2
Patient temperature: 36.6
Patient temperature: 36.5
Patient temperature: 36.9
Potassium: 3.7 mmol/L (ref 3.5–5.1)
Potassium: 4 mmol/L (ref 3.5–5.1)
Potassium: 4.5 mmol/L (ref 3.5–5.1)
Potassium: 4.5 mmol/L (ref 3.5–5.1)
Potassium: 4.5 mmol/L (ref 3.5–5.1)
Potassium: 3.7 mmol/L (ref 3.5–5.1)
Potassium: 3.9 mmol/L (ref 3.5–5.1)
Potassium: 4.2 mmol/L (ref 3.5–5.1)
Potassium: 4.4 mmol/L (ref 3.5–5.1)
Potassium: 4 mmol/L (ref 3.5–5.1)
Potassium: 4 mmol/L (ref 3.5–5.1)
Sodium: 140 mmol/L (ref 135–145)
Sodium: 137 mmol/L (ref 135–145)
Sodium: 140 mmol/L (ref 135–145)
Sodium: 137 mmol/L (ref 135–145)
Sodium: 138 mmol/L (ref 135–145)
Sodium: 140 mmol/L (ref 135–145)
Sodium: 142 mmol/L (ref 135–145)
Sodium: 141 mmol/L (ref 135–145)
Sodium: 142 mmol/L (ref 135–145)
Sodium: 142 mmol/L (ref 135–145)
Sodium: 141 mmol/L (ref 135–145)
TCO2: 26 mmol/L (ref 22–32)
TCO2: 27 mmol/L (ref 22–32)
TCO2: 27 mmol/L (ref 22–32)
TCO2: 24 mmol/L (ref 22–32)
TCO2: 26 mmol/L (ref 22–32)
TCO2: 25 mmol/L (ref 22–32)
TCO2: 27 mmol/L (ref 22–32)
TCO2: 24 mmol/L (ref 22–32)
TCO2: 26 mmol/L (ref 22–32)
TCO2: 25 mmol/L (ref 22–32)
TCO2: 23 mmol/L (ref 22–32)
pCO2 arterial: 47.1 mmHg (ref 32–48)
pCO2 arterial: 44 mmHg (ref 32–48)
pCO2 arterial: 45.4 mmHg (ref 32–48)
pCO2 arterial: 37.2 mmHg (ref 32–48)
pCO2 arterial: 40.1 mmHg (ref 32–48)
pCO2 arterial: 43.3 mmHg (ref 32–48)
pCO2 arterial: 56 mmHg — ABNORMAL HIGH (ref 32–48)
pCO2 arterial: 44.6 mmHg (ref 32–48)
pCO2 arterial: 49.3 mmHg — ABNORMAL HIGH (ref 32–48)
pCO2 arterial: 42.1 mmHg (ref 32–48)
pCO2 arterial: 32.6 mmHg (ref 32–48)
pH, Arterial: 7.329 — ABNORMAL LOW (ref 7.35–7.45)
pH, Arterial: 7.366 (ref 7.35–7.45)
pH, Arterial: 7.369 (ref 7.35–7.45)
pH, Arterial: 7.393 (ref 7.35–7.45)
pH, Arterial: 7.398 (ref 7.35–7.45)
pH, Arterial: 7.338 — ABNORMAL LOW (ref 7.35–7.45)
pH, Arterial: 7.258 — ABNORMAL LOW (ref 7.35–7.45)
pH, Arterial: 7.292 — ABNORMAL LOW (ref 7.35–7.45)
pH, Arterial: 7.303 — ABNORMAL LOW (ref 7.35–7.45)
pH, Arterial: 7.348 — ABNORMAL LOW (ref 7.35–7.45)
pH, Arterial: 7.446 (ref 7.35–7.45)
pO2, Arterial: 428 mmHg — ABNORMAL HIGH (ref 83–108)
pO2, Arterial: 325 mmHg — ABNORMAL HIGH (ref 83–108)
pO2, Arterial: 382 mmHg — ABNORMAL HIGH (ref 83–108)
pO2, Arterial: 256 mmHg — ABNORMAL HIGH (ref 83–108)
pO2, Arterial: 392 mmHg — ABNORMAL HIGH (ref 83–108)
pO2, Arterial: 76 mmHg — ABNORMAL LOW (ref 83–108)
pO2, Arterial: 103 mmHg (ref 83–108)
pO2, Arterial: 101 mmHg (ref 83–108)
pO2, Arterial: 103 mmHg (ref 83–108)
pO2, Arterial: 94 mmHg (ref 83–108)
pO2, Arterial: 62 mmHg — ABNORMAL LOW (ref 83–108)

## 2021-03-17 LAB — POCT I-STAT, CHEM 8
BUN: 10 mg/dL (ref 6–20)
BUN: 9 mg/dL (ref 6–20)
BUN: 9 mg/dL (ref 6–20)
BUN: 9 mg/dL (ref 6–20)
BUN: 8 mg/dL (ref 6–20)
BUN: 8 mg/dL (ref 6–20)
Calcium, Ion: 1.28 mmol/L (ref 1.15–1.40)
Calcium, Ion: 1.26 mmol/L (ref 1.15–1.40)
Calcium, Ion: 1.27 mmol/L (ref 1.15–1.40)
Calcium, Ion: 1.09 mmol/L — ABNORMAL LOW (ref 1.15–1.40)
Calcium, Ion: 1.1 mmol/L — ABNORMAL LOW (ref 1.15–1.40)
Calcium, Ion: 1.13 mmol/L — ABNORMAL LOW (ref 1.15–1.40)
Chloride: 104 mmol/L (ref 98–111)
Chloride: 104 mmol/L (ref 98–111)
Chloride: 105 mmol/L (ref 98–111)
Chloride: 103 mmol/L (ref 98–111)
Chloride: 104 mmol/L (ref 98–111)
Chloride: 103 mmol/L (ref 98–111)
Creatinine, Ser: 0.9 mg/dL (ref 0.61–1.24)
Creatinine, Ser: 0.9 mg/dL (ref 0.61–1.24)
Creatinine, Ser: 0.9 mg/dL (ref 0.61–1.24)
Creatinine, Ser: 0.8 mg/dL (ref 0.61–1.24)
Creatinine, Ser: 0.8 mg/dL (ref 0.61–1.24)
Creatinine, Ser: 0.9 mg/dL (ref 0.61–1.24)
Glucose, Bld: 112 mg/dL — ABNORMAL HIGH (ref 70–99)
Glucose, Bld: 108 mg/dL — ABNORMAL HIGH (ref 70–99)
Glucose, Bld: 123 mg/dL — ABNORMAL HIGH (ref 70–99)
Glucose, Bld: 105 mg/dL — ABNORMAL HIGH (ref 70–99)
Glucose, Bld: 116 mg/dL — ABNORMAL HIGH (ref 70–99)
Glucose, Bld: 134 mg/dL — ABNORMAL HIGH (ref 70–99)
HCT: 43 % (ref 39.0–52.0)
HCT: 39 % (ref 39.0–52.0)
HCT: 40 % (ref 39.0–52.0)
HCT: 34 % — ABNORMAL LOW (ref 39.0–52.0)
HCT: 32 % — ABNORMAL LOW (ref 39.0–52.0)
HCT: 32 % — ABNORMAL LOW (ref 39.0–52.0)
Hemoglobin: 14.6 g/dL (ref 13.0–17.0)
Hemoglobin: 13.3 g/dL (ref 13.0–17.0)
Hemoglobin: 13.6 g/dL (ref 13.0–17.0)
Hemoglobin: 11.6 g/dL — ABNORMAL LOW (ref 13.0–17.0)
Hemoglobin: 10.9 g/dL — ABNORMAL LOW (ref 13.0–17.0)
Hemoglobin: 10.9 g/dL — ABNORMAL LOW (ref 13.0–17.0)
Potassium: 3.7 mmol/L (ref 3.5–5.1)
Potassium: 3.7 mmol/L (ref 3.5–5.1)
Potassium: 3.8 mmol/L (ref 3.5–5.1)
Potassium: 4.6 mmol/L (ref 3.5–5.1)
Potassium: 4.4 mmol/L (ref 3.5–5.1)
Potassium: 3.6 mmol/L (ref 3.5–5.1)
Sodium: 140 mmol/L (ref 135–145)
Sodium: 139 mmol/L (ref 135–145)
Sodium: 139 mmol/L (ref 135–145)
Sodium: 137 mmol/L (ref 135–145)
Sodium: 138 mmol/L (ref 135–145)
Sodium: 140 mmol/L (ref 135–145)
TCO2: 27 mmol/L (ref 22–32)
TCO2: 25 mmol/L (ref 22–32)
TCO2: 26 mmol/L (ref 22–32)
TCO2: 27 mmol/L (ref 22–32)
TCO2: 23 mmol/L (ref 22–32)
TCO2: 24 mmol/L (ref 22–32)

## 2021-03-17 LAB — APTT: aPTT: 41 seconds — ABNORMAL HIGH (ref 24–36)

## 2021-03-17 LAB — CBC
HCT: 44.2 % (ref 39.0–52.0)
HCT: 35 % — ABNORMAL LOW (ref 39.0–52.0)
HCT: 36.4 % — ABNORMAL LOW (ref 39.0–52.0)
Hemoglobin: 15.8 g/dL (ref 13.0–17.0)
Hemoglobin: 12.2 g/dL — ABNORMAL LOW (ref 13.0–17.0)
Hemoglobin: 12.5 g/dL — ABNORMAL LOW (ref 13.0–17.0)
MCH: 31.2 pg (ref 26.0–34.0)
MCH: 31.6 pg (ref 26.0–34.0)
MCH: 30.9 pg (ref 26.0–34.0)
MCHC: 35.7 g/dL (ref 30.0–36.0)
MCHC: 34.9 g/dL (ref 30.0–36.0)
MCHC: 34.3 g/dL (ref 30.0–36.0)
MCV: 87.4 fL (ref 80.0–100.0)
MCV: 90.7 fL (ref 80.0–100.0)
MCV: 89.9 fL (ref 80.0–100.0)
Platelets: 244 10*3/uL (ref 150–400)
Platelets: 128 10*3/uL — ABNORMAL LOW (ref 150–400)
Platelets: 144 10*3/uL — ABNORMAL LOW (ref 150–400)
RBC: 5.06 MIL/uL (ref 4.22–5.81)
RBC: 3.86 MIL/uL — ABNORMAL LOW (ref 4.22–5.81)
RBC: 4.05 MIL/uL — ABNORMAL LOW (ref 4.22–5.81)
RDW: 13.1 % (ref 11.5–15.5)
RDW: 13 % (ref 11.5–15.5)
RDW: 13 % (ref 11.5–15.5)
WBC: 9.6 10*3/uL (ref 4.0–10.5)
WBC: 11.5 10*3/uL — ABNORMAL HIGH (ref 4.0–10.5)
WBC: 11.1 10*3/uL — ABNORMAL HIGH (ref 4.0–10.5)
nRBC: 0 % (ref 0.0–0.2)
nRBC: 0 % (ref 0.0–0.2)
nRBC: 0 % (ref 0.0–0.2)

## 2021-03-17 LAB — ECHO INTRAOPERATIVE TEE
Height: 69 in
Weight: 3272 oz

## 2021-03-17 LAB — GLUCOSE, CAPILLARY
Glucose-Capillary: 113 mg/dL — ABNORMAL HIGH (ref 70–99)
Glucose-Capillary: 119 mg/dL — ABNORMAL HIGH (ref 70–99)
Glucose-Capillary: 123 mg/dL — ABNORMAL HIGH (ref 70–99)
Glucose-Capillary: 130 mg/dL — ABNORMAL HIGH (ref 70–99)
Glucose-Capillary: 131 mg/dL — ABNORMAL HIGH (ref 70–99)
Glucose-Capillary: 132 mg/dL — ABNORMAL HIGH (ref 70–99)

## 2021-03-17 LAB — POCT I-STAT EG7
Acid-base deficit: 1 mmol/L (ref 0.0–2.0)
Bicarbonate: 24.7 mmol/L (ref 20.0–28.0)
Calcium, Ion: 1.08 mmol/L — ABNORMAL LOW (ref 1.15–1.40)
HCT: 32 % — ABNORMAL LOW (ref 39.0–52.0)
Hemoglobin: 10.9 g/dL — ABNORMAL LOW (ref 13.0–17.0)
O2 Saturation: 86 %
Potassium: 3.4 mmol/L — ABNORMAL LOW (ref 3.5–5.1)
Sodium: 141 mmol/L (ref 135–145)
TCO2: 26 mmol/L (ref 22–32)
pCO2, Ven: 44.7 mmHg (ref 44–60)
pH, Ven: 7.35 (ref 7.25–7.43)
pO2, Ven: 54 mmHg — ABNORMAL HIGH (ref 32–45)

## 2021-03-17 LAB — BASIC METABOLIC PANEL
Anion gap: 10 (ref 5–15)
Anion gap: 9 (ref 5–15)
BUN: 11 mg/dL (ref 6–20)
BUN: 10 mg/dL (ref 6–20)
CO2: 21 mmol/L — ABNORMAL LOW (ref 22–32)
CO2: 21 mmol/L — ABNORMAL LOW (ref 22–32)
Calcium: 9.2 mg/dL (ref 8.9–10.3)
Calcium: 7.7 mg/dL — ABNORMAL LOW (ref 8.9–10.3)
Chloride: 106 mmol/L (ref 98–111)
Chloride: 107 mmol/L (ref 98–111)
Creatinine, Ser: 1.09 mg/dL (ref 0.61–1.24)
Creatinine, Ser: 1.02 mg/dL (ref 0.61–1.24)
GFR, Estimated: >60 mL/min (ref >60)
GFR, Estimated: >60 mL/min (ref >60)
Glucose, Bld: 126 mg/dL — ABNORMAL HIGH (ref 70–99)
Glucose, Bld: 116 mg/dL — ABNORMAL HIGH (ref 70–99)
Potassium: 4 mmol/L (ref 3.5–5.1)
Potassium: 4.2 mmol/L (ref 3.5–5.1)
Sodium: 137 mmol/L (ref 135–145)
Sodium: 137 mmol/L (ref 135–145)

## 2021-03-17 LAB — HEPARIN LEVEL (UNFRACTIONATED): Heparin Unfractionated: 0.29 IU/mL — ABNORMAL LOW (ref 0.30–0.70)

## 2021-03-17 LAB — MAGNESIUM: Magnesium: 3.3 mg/dL — ABNORMAL HIGH (ref 1.7–2.4)

## 2021-03-17 LAB — LIPID PANEL
Cholesterol: 122 mg/dL (ref 0–200)
HDL: 31 mg/dL — ABNORMAL LOW (ref >40)
LDL Cholesterol: 54 mg/dL (ref 0–99)
Total CHOL/HDL Ratio: 3.9 RATIO
Triglycerides: 186 mg/dL — ABNORMAL HIGH (ref <150)
VLDL: 37 mg/dL (ref 0–40)

## 2021-03-17 LAB — PLATELET COUNT: Platelets: 150 10*3/uL (ref 150–400)

## 2021-03-17 LAB — HEMOGLOBIN AND HEMATOCRIT, BLOOD
HCT: 33.9 % — ABNORMAL LOW (ref 39.0–52.0)
Hemoglobin: 11.8 g/dL — ABNORMAL LOW (ref 13.0–17.0)

## 2021-03-17 LAB — PROTIME-INR
INR: 1.2 (ref 0.8–1.2)
Prothrombin Time: 15.6 seconds — ABNORMAL HIGH (ref 11.4–15.2)

## 2021-03-17 LAB — TSH: TSH: 1.754 u[IU]/mL (ref 0.350–4.500)

## 2021-03-17 SURGERY — CORONARY ARTERY BYPASS GRAFTING (CABG)
Anesthesia: General | Site: Chest

## 2021-03-17 MED ORDER — HEPARIN SODIUM (PORCINE) 1000 UNIT/ML IJ SOLN
INTRAMUSCULAR | Status: AC
  Filled 2021-03-17: qty 1

## 2021-03-17 MED ORDER — PROTAMINE SULFATE 10 MG/ML IV SOLN
INTRAVENOUS | Status: AC
  Filled 2021-03-17: qty 10

## 2021-03-17 MED ORDER — METOPROLOL TARTRATE 5 MG/5ML IV SOLN: 2.5000 mg | INTRAVENOUS | Status: DC | PRN

## 2021-03-17 MED ORDER — CHLORHEXIDINE GLUCONATE 0.12 % MT SOLN
15.0000 mL | OROMUCOSAL | Status: AC
  Administered 2021-03-17: 15 mL via OROMUCOSAL

## 2021-03-17 MED ORDER — SURGIFLO WITH THROMBIN (HEMOSTATIC MATRIX KIT) OPTIME
TOPICAL | Status: DC | PRN
Start: 2021-03-17 — End: 2021-03-17
  Administered 2021-03-17: 1 via TOPICAL

## 2021-03-17 MED ORDER — ALBUMIN HUMAN 5 % IV SOLN
250.0000 mL | INTRAVENOUS | Status: AC | PRN
  Administered 2021-03-17: 12.5 g via INTRAVENOUS

## 2021-03-17 MED ORDER — PROPOFOL 10 MG/ML IV BOLUS
INTRAVENOUS | Status: AC
  Filled 2021-03-17: qty 20

## 2021-03-17 MED ORDER — ROCURONIUM BROMIDE 10 MG/ML (PF) SYRINGE
PREFILLED_SYRINGE | INTRAVENOUS | Status: AC
  Filled 2021-03-17: qty 10

## 2021-03-17 MED ORDER — DOCUSATE SODIUM 100 MG PO CAPS
200.0000 mg | ORAL_CAPSULE | Freq: Every day | ORAL | Status: DC
  Administered 2021-03-18 – 2021-03-21 (×4): 200 mg via ORAL
  Filled 2021-03-17 (×4): qty 2

## 2021-03-17 MED ORDER — FENTANYL CITRATE (PF) 250 MCG/5ML IJ SOLN
INTRAMUSCULAR | Status: AC
  Filled 2021-03-17: qty 5

## 2021-03-17 MED ORDER — BISACODYL 5 MG PO TBEC
10.0000 mg | DELAYED_RELEASE_TABLET | Freq: Every day | ORAL | Status: DC
  Administered 2021-03-18 – 2021-03-20 (×2): 10 mg via ORAL
  Filled 2021-03-17 (×3): qty 2

## 2021-03-17 MED ORDER — LACTATED RINGERS IV SOLN: INTRAVENOUS | Status: DC

## 2021-03-17 MED ORDER — PROTAMINE SULFATE 10 MG/ML IV SOLN
INTRAVENOUS | Status: DC | PRN
  Administered 2021-03-17 (×5): 50 mg via INTRAVENOUS
  Administered 2021-03-17: 30 mg via INTRAVENOUS
  Administered 2021-03-17: 50 mg via INTRAVENOUS

## 2021-03-17 MED ORDER — SODIUM CHLORIDE 0.9% FLUSH
10.0000 mL | Freq: Two times a day (BID) | INTRAVENOUS | Status: DC
  Administered 2021-03-17 – 2021-03-19 (×3): 10 mL

## 2021-03-17 MED ORDER — FENTANYL CITRATE PF 50 MCG/ML IJ SOSY
50.0000 ug | PREFILLED_SYRINGE | INTRAMUSCULAR | Status: DC | PRN
  Administered 2021-03-17 (×2): 50 ug via INTRAVENOUS
  Administered 2021-03-17: 100 ug via INTRAVENOUS
  Administered 2021-03-18: 50 ug via INTRAVENOUS
  Administered 2021-03-18: 100 ug via INTRAVENOUS
  Administered 2021-03-18 (×3): 50 ug via INTRAVENOUS
  Administered 2021-03-19: 100 ug via INTRAVENOUS
  Filled 2021-03-17: qty 2
  Filled 2021-03-17 (×3): qty 1
  Filled 2021-03-17 (×2): qty 2
  Filled 2021-03-17 (×2): qty 1
  Filled 2021-03-17: qty 2
  Filled 2021-03-17 (×2): qty 1

## 2021-03-17 MED ORDER — ROCURONIUM BROMIDE 10 MG/ML (PF) SYRINGE
PREFILLED_SYRINGE | INTRAVENOUS | Status: AC
  Filled 2021-03-17: qty 20

## 2021-03-17 MED ORDER — POTASSIUM CHLORIDE 10 MEQ/50ML IV SOLN
10.0000 meq | INTRAVENOUS | Status: AC
  Administered 2021-03-17 (×3): 10 meq via INTRAVENOUS

## 2021-03-17 MED ORDER — SODIUM CHLORIDE 0.9 % IV SOLN: 250.0000 mL | INTRAVENOUS | Status: DC

## 2021-03-17 MED ORDER — ASPIRIN EC 325 MG PO TBEC
325.0000 mg | DELAYED_RELEASE_TABLET | Freq: Every day | ORAL | Status: DC
  Administered 2021-03-18 – 2021-03-21 (×3): 325 mg via ORAL
  Filled 2021-03-17 (×4): qty 1

## 2021-03-17 MED ORDER — LEVALBUTEROL HCL 1.25 MG/0.5ML IN NEBU
1.2500 mg | INHALATION_SOLUTION | Freq: Four times a day (QID) | RESPIRATORY_TRACT | Status: DC
Start: 2021-03-17 — End: 2021-03-19
  Administered 2021-03-17 – 2021-03-19 (×8): 1.25 mg via RESPIRATORY_TRACT
  Filled 2021-03-17 (×9): qty 0.5

## 2021-03-17 MED ORDER — SODIUM BICARBONATE 8.4 % IV SOLN
50.0000 meq | Freq: Once | INTRAVENOUS | Status: AC
  Administered 2021-03-17: 50 meq via INTRAVENOUS

## 2021-03-17 MED ORDER — METOPROLOL TARTRATE 25 MG/10 ML ORAL SUSPENSION: 12.5000 mg | Freq: Two times a day (BID) | ORAL | Status: DC

## 2021-03-17 MED ORDER — LACTATED RINGERS IV SOLN: INTRAVENOUS | Status: DC | PRN

## 2021-03-17 MED ORDER — ACETAMINOPHEN 500 MG PO TABS
1000.0000 mg | ORAL_TABLET | Freq: Four times a day (QID) | ORAL | Status: DC
  Administered 2021-03-17 – 2021-03-21 (×14): 1000 mg via ORAL
  Filled 2021-03-17 (×14): qty 2

## 2021-03-17 MED ORDER — SODIUM CHLORIDE 0.45 % IV SOLN: INTRAVENOUS | Status: DC | PRN

## 2021-03-17 MED ORDER — PROPOFOL 10 MG/ML IV BOLUS
INTRAVENOUS | Status: DC | PRN
Start: 2021-03-17 — End: 2021-03-17
  Administered 2021-03-17 (×2): 50 mg via INTRAVENOUS

## 2021-03-17 MED ORDER — ASPIRIN 81 MG PO CHEW
324.0000 mg | CHEWABLE_TABLET | Freq: Every day | ORAL | Status: DC
  Administered 2021-03-19: 324 mg
  Filled 2021-03-17 (×2): qty 4

## 2021-03-17 MED ORDER — PHENYLEPHRINE 40 MCG/ML (10ML) SYRINGE FOR IV PUSH (FOR BLOOD PRESSURE SUPPORT)
PREFILLED_SYRINGE | INTRAVENOUS | Status: DC | PRN
Start: 2021-03-17 — End: 2021-03-17
  Administered 2021-03-17: 80 ug via INTRAVENOUS
  Administered 2021-03-17: 60 ug via INTRAVENOUS
  Administered 2021-03-17: 20 ug via INTRAVENOUS

## 2021-03-17 MED ORDER — ALLOPURINOL 100 MG PO TABS
100.0000 mg | ORAL_TABLET | Freq: Every day | ORAL | Status: DC
  Administered 2021-03-18 – 2021-03-21 (×4): 100 mg via ORAL
  Filled 2021-03-17 (×4): qty 1

## 2021-03-17 MED ORDER — VANCOMYCIN HCL IN DEXTROSE 1-5 GM/200ML-% IV SOLN
1000.0000 mg | Freq: Once | INTRAVENOUS | Status: AC
  Administered 2021-03-17: 1000 mg via INTRAVENOUS
  Filled 2021-03-17: qty 200

## 2021-03-17 MED ORDER — ROCURONIUM BROMIDE 10 MG/ML (PF) SYRINGE
PREFILLED_SYRINGE | INTRAVENOUS | Status: DC | PRN
Start: 2021-03-17 — End: 2021-03-17
  Administered 2021-03-17: 50 mg via INTRAVENOUS
  Administered 2021-03-17: 100 mg via INTRAVENOUS
  Administered 2021-03-17 (×2): 50 mg via INTRAVENOUS
  Administered 2021-03-17 (×2): 20 mg via INTRAVENOUS

## 2021-03-17 MED ORDER — NICARDIPINE HCL IN NACL 20-0.86 MG/200ML-% IV SOLN
3.0000 mg/h | INTRAVENOUS | Status: DC
  Administered 2021-03-17 – 2021-03-18 (×2): 5 mg/h via INTRAVENOUS
  Filled 2021-03-17 (×2): qty 200

## 2021-03-17 MED ORDER — HEMOSTATIC AGENTS (NO CHARGE) OPTIME
TOPICAL | Status: DC | PRN
  Administered 2021-03-17 (×2): 1 via TOPICAL

## 2021-03-17 MED ORDER — BISACODYL 10 MG RE SUPP: 10.0000 mg | Freq: Every day | RECTAL | Status: DC

## 2021-03-17 MED ORDER — HEMOSTATIC AGENTS (NO CHARGE) OPTIME
TOPICAL | Status: DC | PRN
  Administered 2021-03-17: 1 via TOPICAL

## 2021-03-17 MED ORDER — METOPROLOL TARTRATE 12.5 MG HALF TABLET: 12.5000 mg | ORAL_TABLET | Freq: Two times a day (BID) | ORAL | Status: DC

## 2021-03-17 MED ORDER — SODIUM BICARBONATE 4.2 % IV SOLN
50.0000 meq | Freq: Once | INTRAVENOUS | Status: DC
  Filled 2021-03-17: qty 100

## 2021-03-17 MED ORDER — ONDANSETRON HCL 4 MG/2ML IJ SOLN
4.0000 mg | Freq: Four times a day (QID) | INTRAMUSCULAR | Status: DC | PRN
  Administered 2021-03-18 (×2): 4 mg via INTRAVENOUS
  Filled 2021-03-17 (×2): qty 2

## 2021-03-17 MED ORDER — PHENYLEPHRINE HCL-NACL 20-0.9 MG/250ML-% IV SOLN: 0.0000 ug/min | INTRAVENOUS | Status: DC

## 2021-03-17 MED ORDER — SODIUM CHLORIDE 0.9% FLUSH: 10.0000 mL | INTRAVENOUS | Status: DC | PRN

## 2021-03-17 MED ORDER — ARTIFICIAL TEARS OPHTHALMIC OINT
TOPICAL_OINTMENT | OPHTHALMIC | Status: AC
  Filled 2021-03-17: qty 3.5

## 2021-03-17 MED ORDER — MIDAZOLAM HCL (PF) 5 MG/ML IJ SOLN
INTRAMUSCULAR | Status: DC | PRN
  Administered 2021-03-17: 1 mg via INTRAVENOUS
  Administered 2021-03-17: 2 mg via INTRAVENOUS
  Administered 2021-03-17 (×2): 3 mg via INTRAVENOUS
  Administered 2021-03-17 (×2): .5 mg via INTRAVENOUS

## 2021-03-17 MED ORDER — LACTATED RINGERS IV SOLN
INTRAVENOUS | Status: DC
  Administered 2021-03-17: 20 mL/h via INTRAVENOUS

## 2021-03-17 MED ORDER — KETOROLAC TROMETHAMINE 30 MG/ML IJ SOLN
30.0000 mg | Freq: Four times a day (QID) | INTRAMUSCULAR | Status: AC
  Administered 2021-03-17 – 2021-03-18 (×3): 30 mg via INTRAVENOUS
  Filled 2021-03-17 (×3): qty 1

## 2021-03-17 MED ORDER — ALBUMIN HUMAN 5 % IV SOLN
INTRAVENOUS | Status: DC | PRN
Start: 2021-03-17 — End: 2021-03-17

## 2021-03-17 MED ORDER — ACETAMINOPHEN 160 MG/5ML PO SOLN: 650.0000 mg | Freq: Once | ORAL | Status: AC

## 2021-03-17 MED ORDER — CEFAZOLIN SODIUM-DEXTROSE 2-4 GM/100ML-% IV SOLN
2.0000 g | Freq: Three times a day (TID) | INTRAVENOUS | Status: AC
  Administered 2021-03-17 – 2021-03-19 (×6): 2 g via INTRAVENOUS
  Filled 2021-03-17 (×6): qty 100

## 2021-03-17 MED ORDER — FENTANYL CITRATE (PF) 250 MCG/5ML IJ SOLN
INTRAMUSCULAR | Status: DC | PRN
  Administered 2021-03-17 (×2): 100 ug via INTRAVENOUS
  Administered 2021-03-17: 25 ug via INTRAVENOUS
  Administered 2021-03-17: 200 ug via INTRAVENOUS
  Administered 2021-03-17 (×2): 150 ug via INTRAVENOUS
  Administered 2021-03-17: 100 ug via INTRAVENOUS
  Administered 2021-03-17: 50 ug via INTRAVENOUS
  Administered 2021-03-17: 25 ug via INTRAVENOUS
  Administered 2021-03-17 (×3): 100 ug via INTRAVENOUS
  Administered 2021-03-17 (×2): 150 ug via INTRAVENOUS

## 2021-03-17 MED ORDER — LACTATED RINGERS IV SOLN: 500.0000 mL | Freq: Once | INTRAVENOUS | Status: DC | PRN

## 2021-03-17 MED ORDER — MIDAZOLAM HCL 2 MG/2ML IJ SOLN: 2.0000 mg | INTRAMUSCULAR | Status: DC | PRN

## 2021-03-17 MED ORDER — SODIUM CHLORIDE (PF) 0.9 % IJ SOLN
INTRAMUSCULAR | Status: AC
  Filled 2021-03-17: qty 10

## 2021-03-17 MED ORDER — 0.9 % SODIUM CHLORIDE (POUR BTL) OPTIME
TOPICAL | Status: DC | PRN
  Administered 2021-03-17: 5000 mL

## 2021-03-17 MED ORDER — PANTOPRAZOLE SODIUM 40 MG PO TBEC
40.0000 mg | DELAYED_RELEASE_TABLET | Freq: Every day | ORAL | Status: DC
  Administered 2021-03-19 – 2021-03-21 (×3): 40 mg via ORAL
  Filled 2021-03-17 (×3): qty 1

## 2021-03-17 MED ORDER — PLASMA-LYTE A IV SOLN
INTRAVENOUS | Status: DC | PRN
  Administered 2021-03-17: 500 mL via INTRAVASCULAR

## 2021-03-17 MED ORDER — PROTAMINE SULFATE 10 MG/ML IV SOLN
INTRAVENOUS | Status: AC
  Filled 2021-03-17: qty 25

## 2021-03-17 MED ORDER — INSULIN REGULAR(HUMAN) IN NACL 100-0.9 UT/100ML-% IV SOLN: INTRAVENOUS | Status: DC

## 2021-03-17 MED ORDER — MAGNESIUM SULFATE 4 GM/100ML IV SOLN
4.0000 g | Freq: Once | INTRAVENOUS | Status: AC
  Administered 2021-03-17: 4 g via INTRAVENOUS
  Filled 2021-03-17: qty 100

## 2021-03-17 MED ORDER — SODIUM CHLORIDE 0.9% FLUSH
3.0000 mL | Freq: Two times a day (BID) | INTRAVENOUS | Status: DC
  Administered 2021-03-18 – 2021-03-20 (×5): 3 mL via INTRAVENOUS

## 2021-03-17 MED ORDER — DEXTROSE 50 % IV SOLN
0.0000 mL | INTRAVENOUS | Status: DC | PRN
  Filled 2021-03-17: qty 50

## 2021-03-17 MED ORDER — SODIUM CHLORIDE (PF) 0.9 % IJ SOLN
OROMUCOSAL | Status: DC | PRN
  Administered 2021-03-17 (×3): 4 mL via TOPICAL

## 2021-03-17 MED ORDER — SODIUM CHLORIDE 0.9% FLUSH: 3.0000 mL | INTRAVENOUS | Status: DC | PRN

## 2021-03-17 MED ORDER — CHLORHEXIDINE GLUCONATE CLOTH 2 % EX PADS
6.0000 | MEDICATED_PAD | Freq: Every day | CUTANEOUS | Status: DC
  Administered 2021-03-17 – 2021-03-21 (×4): 6 via TOPICAL

## 2021-03-17 MED ORDER — DEXMEDETOMIDINE HCL IN NACL 400 MCG/100ML IV SOLN: 0.0000 ug/kg/h | INTRAVENOUS | Status: DC

## 2021-03-17 MED ORDER — FAMOTIDINE IN NACL 20-0.9 MG/50ML-% IV SOLN
20.0000 mg | Freq: Two times a day (BID) | INTRAVENOUS | Status: AC
  Administered 2021-03-17 (×2): 20 mg via INTRAVENOUS
  Filled 2021-03-17 (×2): qty 50

## 2021-03-17 MED ORDER — NITROGLYCERIN IN D5W 200-5 MCG/ML-% IV SOLN: 0.0000 ug/min | INTRAVENOUS | Status: DC

## 2021-03-17 MED ORDER — SODIUM CHLORIDE 0.9 % IV SOLN: INTRAVENOUS | Status: DC | PRN

## 2021-03-17 MED ORDER — TRAMADOL HCL 50 MG PO TABS
50.0000 mg | ORAL_TABLET | ORAL | Status: DC | PRN
  Administered 2021-03-17 – 2021-03-18 (×2): 100 mg via ORAL
  Administered 2021-03-19 (×3): 50 mg via ORAL
  Administered 2021-03-20: 100 mg via ORAL
  Administered 2021-03-20 (×2): 50 mg via ORAL
  Administered 2021-03-21: 100 mg via ORAL
  Filled 2021-03-17 (×3): qty 1
  Filled 2021-03-17 (×4): qty 2
  Filled 2021-03-17 (×2): qty 1

## 2021-03-17 MED ORDER — FLUTICASONE FUROATE-VILANTEROL 200-25 MCG/ACT IN AEPB
1.0000 | INHALATION_SPRAY | Freq: Every day | RESPIRATORY_TRACT | Status: DC
  Administered 2021-03-18 – 2021-03-20 (×3): 1 via RESPIRATORY_TRACT

## 2021-03-17 MED ORDER — SODIUM CHLORIDE 0.9 % IV SOLN: INTRAVENOUS | Status: DC

## 2021-03-17 MED ORDER — ACETAMINOPHEN 650 MG RE SUPP
650.0000 mg | Freq: Once | RECTAL | Status: AC
  Administered 2021-03-17: 650 mg via RECTAL

## 2021-03-17 MED ORDER — LACTATED RINGERS IV SOLN
INTRAVENOUS | Status: DC | PRN
Start: 2021-03-17 — End: 2021-03-17

## 2021-03-17 MED ORDER — HEPARIN SODIUM (PORCINE) 1000 UNIT/ML IJ SOLN
INTRAMUSCULAR | Status: DC | PRN
  Administered 2021-03-17: 30000 [IU] via INTRAVENOUS
  Administered 2021-03-17: 3000 [IU] via INTRAVENOUS

## 2021-03-17 MED ORDER — HEPARIN SODIUM (PORCINE) 1000 UNIT/ML IJ SOLN
INTRAMUSCULAR | Status: AC
  Filled 2021-03-17: qty 10

## 2021-03-17 MED ORDER — ACETAMINOPHEN 160 MG/5ML PO SOLN: 1000.0000 mg | Freq: Four times a day (QID) | ORAL | Status: DC

## 2021-03-17 MED ORDER — MIDAZOLAM HCL (PF) 10 MG/2ML IJ SOLN
INTRAMUSCULAR | Status: AC
  Filled 2021-03-17: qty 2

## 2021-03-17 MED FILL — Heparin Sod (Porcine)-NaCl IV Soln 1000 Unit/500ML-0.9%: INTRAVENOUS | Qty: 1000 | Status: AC

## 2021-03-17 SURGICAL SUPPLY — 95 items
ADAPTER CARDIO PERF ANTE/RETRO (ADAPTER) ×4 IMPLANT
ADH SKN CLS APL DERMABOND .7 (GAUZE/BANDAGES/DRESSINGS) ×3
ADPR PRFSN 84XANTGRD RTRGD (ADAPTER) ×3
AGENT HMST KT MTR STRL THRMB (HEMOSTASIS) ×3
BAG DECANTER FOR FLEXI CONT (MISCELLANEOUS) ×4 IMPLANT
BLADE STERNUM SYSTEM 6 (BLADE) ×4 IMPLANT
BLADE SURG 12 STRL SS (BLADE) ×4 IMPLANT
BNDG ELASTIC 4X5.8 VLCR STR LF (GAUZE/BANDAGES/DRESSINGS) ×4 IMPLANT
BNDG ELASTIC 6X5.8 VLCR STR LF (GAUZE/BANDAGES/DRESSINGS) ×4 IMPLANT
BNDG GAUZE ELAST 4 BULKY (GAUZE/BANDAGES/DRESSINGS) ×4 IMPLANT
CANISTER SUCT 3000ML PPV (MISCELLANEOUS) ×4 IMPLANT
CANNULA GUNDRY RCSP 15FR (MISCELLANEOUS) ×4 IMPLANT
CATH CPB KIT VANTRIGT (MISCELLANEOUS) ×4 IMPLANT
CATH ROBINSON RED A/P 18FR (CATHETERS) ×12 IMPLANT
CATH THORACIC 28FR RT ANG (CATHETERS) ×4 IMPLANT
CLIP RETRACTION 3.0MM CORONARY (MISCELLANEOUS) ×1 IMPLANT
CLIP TI WIDE RED SMALL 24 (CLIP) ×1 IMPLANT
CONTAINER PROTECT SURGISLUSH (MISCELLANEOUS) ×8 IMPLANT
DERMABOND ADVANCED (GAUZE/BANDAGES/DRESSINGS) ×1
DERMABOND ADVANCED .7 DNX12 (GAUZE/BANDAGES/DRESSINGS) IMPLANT
DRAIN CHANNEL 32F RND 10.7 FF (WOUND CARE) ×4 IMPLANT
DRAPE CARDIOVASCULAR INCISE (DRAPES) ×4
DRAPE SRG 135X102X78XABS (DRAPES) ×3 IMPLANT
DRSG AQUACEL AG ADV 3.5X14 (GAUZE/BANDAGES/DRESSINGS) ×4 IMPLANT
DRSG COVADERM 4X14 (GAUZE/BANDAGES/DRESSINGS) ×1 IMPLANT
ELECT BLADE 4.0 EZ CLEAN MEGAD (MISCELLANEOUS) ×4
ELECT BLADE 6.5 EXT (BLADE) ×4 IMPLANT
ELECT CAUTERY BLADE 6.4 (BLADE) ×4 IMPLANT
ELECT REM PT RETURN 9FT ADLT (ELECTROSURGICAL) ×8
ELECTRODE BLDE 4.0 EZ CLN MEGD (MISCELLANEOUS) ×3 IMPLANT
ELECTRODE REM PT RTRN 9FT ADLT (ELECTROSURGICAL) ×6 IMPLANT
FELT TEFLON 1X6 (MISCELLANEOUS) ×8 IMPLANT
GAUZE 4X4 16PLY ~~LOC~~+RFID DBL (SPONGE) ×4 IMPLANT
GAUZE SPONGE 4X4 12PLY STRL (GAUZE/BANDAGES/DRESSINGS) ×7 IMPLANT
GAUZE SPONGE 4X4 12PLY STRL LF (GAUZE/BANDAGES/DRESSINGS) ×1 IMPLANT
GLOVE SURG ENC MOIS LTX SZ7.5 (GLOVE) ×12 IMPLANT
GOWN STRL REUS W/ TWL LRG LVL3 (GOWN DISPOSABLE) ×12 IMPLANT
GOWN STRL REUS W/TWL LRG LVL3 (GOWN DISPOSABLE) ×16
HEMOSTAT POWDER SURGIFOAM 1G (HEMOSTASIS) ×13 IMPLANT
HEMOSTAT SURGICEL 2X14 (HEMOSTASIS) ×4 IMPLANT
INSERT FOGARTY XLG (MISCELLANEOUS) IMPLANT
KIT BASIN OR (CUSTOM PROCEDURE TRAY) ×4 IMPLANT
KIT SUCTION CATH 14FR (SUCTIONS) ×4 IMPLANT
KIT TURNOVER KIT B (KITS) ×4 IMPLANT
KIT VASOVIEW HEMOPRO 2 VH 4000 (KITS) ×4 IMPLANT
LEAD PACING MYOCARDI (MISCELLANEOUS) ×4 IMPLANT
MARKER GRAFT CORONARY BYPASS (MISCELLANEOUS) ×12 IMPLANT
NS IRRIG 1000ML POUR BTL (IV SOLUTION) ×20 IMPLANT
PACK E OPEN HEART (SUTURE) ×4 IMPLANT
PACK OPEN HEART (CUSTOM PROCEDURE TRAY) ×4 IMPLANT
PAD ARMBOARD 7.5X6 YLW CONV (MISCELLANEOUS) ×8 IMPLANT
PAD ELECT DEFIB RADIOL ZOLL (MISCELLANEOUS) ×4 IMPLANT
PENCIL BUTTON HOLSTER BLD 10FT (ELECTRODE) ×4 IMPLANT
POSITIONER HEAD DONUT 9IN (MISCELLANEOUS) ×4 IMPLANT
POWDER SURGICEL 3.0 GRAM (HEMOSTASIS) ×4 IMPLANT
PUNCH AORTIC ROTATE 4.0MM (MISCELLANEOUS) IMPLANT
PUNCH AORTIC ROTATE 4.5MM 8IN (MISCELLANEOUS) ×1 IMPLANT
SET MPS 3-ND DEL (MISCELLANEOUS) ×1 IMPLANT
SPONGE T-LAP 18X18 ~~LOC~~+RFID (SPONGE) ×16 IMPLANT
SPONGE T-LAP 4X18 ~~LOC~~+RFID (SPONGE) ×5 IMPLANT
SUPPORT HEART JANKE-BARRON (MISCELLANEOUS) ×4 IMPLANT
SURGIFLO W/THROMBIN 8M KIT (HEMOSTASIS) ×4 IMPLANT
SUT BONE WAX W31G (SUTURE) ×5 IMPLANT
SUT MNCRL AB 4-0 PS2 18 (SUTURE) IMPLANT
SUT PROLENE 3 0 SH DA (SUTURE) IMPLANT
SUT PROLENE 3 0 SH1 36 (SUTURE) IMPLANT
SUT PROLENE 4 0 RB 1 (SUTURE) ×4
SUT PROLENE 4 0 SH DA (SUTURE) ×4 IMPLANT
SUT PROLENE 4-0 RB1 .5 CRCL 36 (SUTURE) ×3 IMPLANT
SUT PROLENE 5 0 C 1 36 (SUTURE) IMPLANT
SUT PROLENE 6 0 C 1 30 (SUTURE) ×2 IMPLANT
SUT PROLENE 6 0 CC (SUTURE) ×13 IMPLANT
SUT PROLENE 8 0 BV175 6 (SUTURE) ×1 IMPLANT
SUT PROLENE BLUE 7 0 (SUTURE) ×4 IMPLANT
SUT PROLENE POLY MONO (SUTURE) ×1 IMPLANT
SUT SILK  1 MH (SUTURE)
SUT SILK 1 MH (SUTURE) IMPLANT
SUT SILK 2 0 SH CR/8 (SUTURE) ×1 IMPLANT
SUT SILK 3 0 SH CR/8 (SUTURE) IMPLANT
SUT STEEL 6MS V (SUTURE) ×7 IMPLANT
SUT STEEL SZ 6 DBL 3X14 BALL (SUTURE) ×5 IMPLANT
SUT VIC AB 1 CTX 36 (SUTURE) ×8
SUT VIC AB 1 CTX36XBRD ANBCTR (SUTURE) ×6 IMPLANT
SUT VIC AB 2-0 CT1 27 (SUTURE) ×4
SUT VIC AB 2-0 CT1 TAPERPNT 27 (SUTURE) IMPLANT
SUT VIC AB 2-0 CTX 27 (SUTURE) IMPLANT
SUT VIC AB 3-0 X1 27 (SUTURE) ×1 IMPLANT
SYSTEM SAHARA CHEST DRAIN ATS (WOUND CARE) ×4 IMPLANT
TAPE PAPER 3X10 WHT MICROPORE (GAUZE/BANDAGES/DRESSINGS) ×1 IMPLANT
TOWEL GREEN STERILE (TOWEL DISPOSABLE) ×4 IMPLANT
TOWEL GREEN STERILE FF (TOWEL DISPOSABLE) ×4 IMPLANT
TRAY FOLEY SLVR 16FR TEMP STAT (SET/KITS/TRAYS/PACK) ×4 IMPLANT
TUBING LAP HI FLOW INSUFFLATIO (TUBING) ×4 IMPLANT
UNDERPAD 30X36 HEAVY ABSORB (UNDERPADS AND DIAPERS) ×4 IMPLANT
WATER STERILE IRR 1000ML POUR (IV SOLUTION) ×8 IMPLANT

## 2021-03-17 NOTE — Brief Op Note (Signed)
03/15/2021 - 03/17/2021  7:05 AM  PATIENT:  Brian Brewer  56 y.o. male  PRE-OPERATIVE DIAGNOSIS:  CAD  POST-OPERATIVE DIAGNOSIS:  CAD  PROCEDURE:  Procedure(s): CORONARY ARTERY BYPASS GRAFTING (CABG) X THREE USING RIGHT GREATER SAPHENOUS VEIN HARVESTED ENDOSCOPICALLY AND LEFT INTERNAL MAMMARY ARTERY. (N/A) TRANSESOPHAGEAL ECHOCARDIOGRAM (TEE) (N/A) APPLICATION OF CELL SAVER LIMA-LAD SVG-PD SVG-DIAG EVH 50/10 MIN  SURGEON:  Surgeon(s) and Role:    Dahlia Byes, MD - Primary  PHYSICIAN ASSISTANT: Iven Earnhart PA-C  ASSISTANTS: STAFF   ANESTHESIA:   general  EBL:  558 mL   BLOOD ADMINISTERED:none  DRAINS:  LEFT PLEURAL AND MEDIASTINAL CHEST TUBES    LOCAL MEDICATIONS USED:  NONE  SPECIMEN:  No Specimen  DISPOSITION OF SPECIMEN:  N/A  COUNTS:  YES  TOURNIQUET:  * No tourniquets in log *  DICTATION: .Other Dictation: Dictation Number PENDING  PLAN OF CARE: Admit to inpatient   PATIENT DISPOSITION:  ICU - intubated and hemodynamically stable.   Delay start of Pharmacological VTE agent (>24hrs) due to surgical blood loss or risk of bleeding: yes  COMPLICATIONS: NO KNOWN

## 2021-03-17 NOTE — Progress Notes (Signed)
°  Echocardiogram Echocardiogram Transesophageal has been performed.  Brian Brewer 03/17/2021, 9:33 AM

## 2021-03-17 NOTE — Anesthesia Procedure Notes (Addendum)
Central Venous Catheter Insertion Performed by: [name], MD, anesthesiologist Start/End2/15/2023 8:15 AM, 03/17/2021 8:20 AM Patient location: Pre-op. Preanesthetic checklist: patient identified, IV checked, site marked, risks and benefits discussed, surgical consent, monitors and equipment checked, pre-op evaluation and timeout performed Position: Trendelenburg Hand hygiene performed  and maximum sterile barriers used  Total catheter length 100. PA cath was placed.Swan type:thermodilution PA Cath depth:45 Procedure performed without using ultrasound guided technique. Attempts: 1 Patient tolerated the procedure well with no immediate complications.

## 2021-03-17 NOTE — Progress Notes (Signed)
Pre Procedure note for inpatients:   Brian Brewer has been scheduled for Procedure(s): LEFT HEART CATH AND CORONARY ANGIOGRAPHY (N/A) today. The various methods of treatment have been discussed with the patient. After consideration of the risks, benefits and treatment options the patient has consented to the planned procedure.   The patient has been seen and labs reviewed. There are no changes in the patients condition to prevent proceeding with the planned procedure today.  Recent labs:  Lab Results  Component Value Date   WBC 9.6 03/17/2021   HGB 15.8 03/17/2021   HCT 44.2 03/17/2021   PLT 244 03/17/2021   GLUCOSE 126 (H) 03/17/2021   CHOL 149 01/18/2021   TRIG 204 (H) 01/18/2021   HDL 35 (L) 01/18/2021   LDLCALC 80 01/18/2021   ALT 41 03/15/2021   AST 28 03/15/2021   NA 137 03/17/2021   K 4.0 03/17/2021   CL 106 03/17/2021   CREATININE 1.09 03/17/2021   BUN 11 03/17/2021   CO2 21 (L) 03/17/2021   TSH 1.754 03/17/2021   INR 0.9 03/16/2021   HGBA1C 5.7 (H) 03/16/2021    Dahlia Byes, MD 03/17/2021 7:31 AM

## 2021-03-17 NOTE — Hospital Course (Addendum)
°  History of Present Illness:    At time of cardiothoracic surgical consultation This is a 56 year old male with a past medical history of hyperlipidemia, hypertension, tobacco abuse, family history (father had heart attack in his 30's and his grandmother as well), GERD, melanoma (on back), and psoriatic arthritis (on Rinvoq) who had COVID about 8 weeks ago. Patient had mostly upper respiratory symptoms and cough. He was then chopping wood when he developed chest discomfort and shortness of breath. He thought this might be from recent COVID infection. On 03/15/2021 he was picking sticks up in the yard when he had severe, burning like chest pain and shortness of breath. He denied syncope, nausea, LE edema, or abdominal pain. He initially presented to [place]. EKG was done and results indicated possible cardiac ischemia. He was then instructed to go to [facility] ED for further evaluation and treatment. EKG showed TWI in aVL and sub millimeter STE in leads II and III. His initial Troponin was 45. He ruled in for a NSTEMI. He was put on a Heparin drip and transferred to [facility]. 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 Dr. Prescott Gum has been consulted for the consideration of coronary artery bypass grafting surgery. At the time of my exam, his wife, daughter, and son were at bedside. Patient denies chest pain or shortness of breath. Vital signs are HR 82, BP 134/82.  The patient and his studies were evaluated by Dr.Van Trigt who  recommended proceeding with CABG.  Hospital Course: The patient was felt to be medically stable to proceed and on 03/17/2021 he was taken to the operating room at which time he underwent coronary artery bypass grafting x3.  He tolerated the procedure well  was taken the surgical intensive care unit in stable condition.  Vital signs and hemodynamics remained stable.  He was weaned from mechanical ventilator and extubated routinely by late afternoon on the day of surgery.  By the morning of the first postoperative day, he was off all vasoactive drips.  Monitoring lines were removed and he was mobilized.  Chest tubes were left on the first day postop for drainage.  He does have an expected postoperative volume overload but is responding well to diuretics.  Renal function has remained within normal limits.  Blood sugars have been under good control using standard protocols.  His hemoglobin A1c preoperatively was 5.7 on no medications.  He does have an expected acute blood loss anemia which has been trended over time.  Values are fairly stable.  Most recent hemoglobin hematocrit dated 03/19/21 were 10.7 and 31.5 respectively.  All routine lines, monitors and drainage devices have been discontinued in the standard fashion.  He was transferred to the 4 E. telemetry unit for ongoing rehabilitation on postoperative day #2.  We plan to start Plavix at the time of discharge.  The patient's EPW were removed without difficulty.  He was mildly hypertensive and restarted on his home HCTZ/Lisinopril combo.  He is maintaining NSR.  He is ambulating independently.  His surgical incisions are healing without evidence of infection.  He is medically stable for discharge home today.

## 2021-03-17 NOTE — Anesthesia Procedure Notes (Addendum)
°  Central Venous Catheter Insertion Performed by: [name], MD, anesthesiologist Start/End2/15/2023 8:05 AM, 03/17/2021 8:15 AM Patient location: Pre-op. Preanesthetic checklist: patient identified, IV checked, site marked, risks and benefits discussed, surgical consent, monitors and equipment checked, pre-op evaluation, timeout performed and anesthesia consent Position: Trendelenburg Lidocaine 1% used for infiltration and patient sedated Hand hygiene performed , maximum sterile barriers used  and Seldinger technique used Catheter size: 8.5 Fr Central line was placed.Sheath introducer Procedure performed using ultrasound guided technique. Ultrasound Notes:anatomy identified, needle tip was noted to be adjacent to the nerve/plexus identified, no ultrasound evidence of intravascular and/or intraneural injection and image(s) printed for medical record Attempts: 1 Following insertion, line sutured, dressing applied and Biopatch. Post procedure assessment: free fluid flow, blood return through all ports and no air  Patient tolerated the procedure well with no immediate complications.

## 2021-03-17 NOTE — Progress Notes (Signed)
° °   °  [address]       [place] [address]             [phone]      S/p CABG x 3  Extubated at 2 hours  BP 105/67    Pulse 84    Temp 97.9 F (36.6 C)    Resp 12    Ht 5\' 9"  (1.753 m)    Wt 92.8 kg    SpO2 99%    BMI 30.20 kg/m  25/16 CI= 2.4   Intake/Output Summary (Last 24 hours) at 03/17/2021 1740 Last data filed at 03/17/2021 1700 Gross per 24 hour  Intake 3700.56 ml  Output 3608 ml  Net 92.56 ml   K= 4.0, Hct 33  Doing well early postop  Remo Lipps C. Roxan Hockey, MD [facility] [phone]

## 2021-03-17 NOTE — Discharge Instructions (Addendum)
Discharge Instructions:  1. You may shower, please wash incisions daily with soap and water and keep dry.  If you wish to cover wounds with dressing you may do so but please keep clean and change daily.  No tub baths or swimming until incisions have completely healed.  If your incisions become red or develop any drainage please call our office at [phone]  2. No Driving until cleared by Dr. Lucianne Lei Trigt's office and you are no longer using narcotic pain medications  3. Monitor your weight daily.. Please use the same scale and weigh at same time... If you gain 5-10 lbs in 48 hours with associated lower extremity swelling, please contact our office at [phone]  4. Fever of 101.5 for at least 24 hours with no source, please contact our office at [phone]  5. Activity- up as tolerated, please walk at least 3 times per day.  Avoid strenuous activity, no lifting, pushing, or pulling with your arms over 8-10 lbs for a minimum of 6 weeks  6. If any questions or concerns arise, please do not hesitate to contact our office at [phone]  Information about your medication: Plavix (anti-platelet agent)  Generic Name (Brand): clopidogrel (Plavix), once daily medication  PURPOSE: You are taking this medication along with aspirin to lower your chance of having a heart attack, stroke, or blood clots. These can be fatal. Plavix and aspirin help prevent platelets from sticking together and forming a clot that can block an artery or graft.  Common SIDE EFFECTS you may experience include: bruising or bleeding more easily, shortness of breath  Do not stop taking PLAVIX without talking to the doctor who prescribes it for you. People who are treated with a stent and stop taking Plavix too soon, have a higher risk of getting a blood clot in the stent, having a heart attack, or dying. If you stop Plavix because of bleeding, or for other reasons, your risk of a heart attack or stroke may increase.   Avoid  taking NSAID agents or anti-inflammatory medications such as ibuprofen, naproxen given increased bleed risk with plavix - can use acetaminophen (Tylenol) if needed for pain.  Avoid taking over the counter stomach medications omeprazole (Prilosec) or esomeprazole (Nexium) since these do interact and make plavix less effective - ask your pharmacist or doctor for alterative agents if needed for heartburn or GERD.   Tell all of your doctors and dentists that you are taking Plavix. They should talk to the doctor who prescribed Plavix for you before you have any surgery or invasive procedure.   Contact your health care provider if you experience: severe or uncontrollable bleeding, pink/red/brown urine, vomiting blood or vomit that looks like "coffee grounds", red or black stools (looks like tar), coughing up blood or blood clots ----------------------------------------------------------------------------------------------------------------------

## 2021-03-17 NOTE — Transfer of Care (Signed)
Immediate Anesthesia Transfer of Care Note  Patient: ESTUARDO FRISBEE  Procedure(s) Performed: CORONARY ARTERY BYPASS GRAFTING (CABG) X THREE USING RIGHT GREATER SAPHENOUS VEIN HARVESTED ENDOSCOPICALLY AND LEFT INTERNAL MAMMARY ARTERY. (Chest) TRANSESOPHAGEAL ECHOCARDIOGRAM (TEE) APPLICATION OF CELL SAVER  Patient Location: ICU  Anesthesia Type:General  Level of Consciousness: Patient remains intubated per anesthesia plan  Airway & Oxygen Therapy: Patient remains intubated per anesthesia plan and Patient placed on Ventilator (see vital sign flow sheet for setting)  Post-op Assessment: Report given to RN  Post vital signs: Reviewed and stable  Last Vitals:  Vitals Value Taken Time  BP 111/76 03/17/21 1413  Temp    Pulse 80 03/17/21 1414  Resp 14 03/17/21 1414  SpO2 97 % 03/17/21 1414  Vitals shown include unvalidated device data.  Last Pain:  Vitals:   03/17/21 0606  TempSrc: Oral  PainSc:       Patients Stated Pain Goal: 0 (85/02/77 4128)  Complications: No notable events documented.

## 2021-03-17 NOTE — Anesthesia Postprocedure Evaluation (Signed)
Anesthesia Post Note  Patient: Brian Brewer  Procedure(s) Performed: CORONARY ARTERY BYPASS GRAFTING (CABG) X THREE USING RIGHT GREATER SAPHENOUS VEIN HARVESTED ENDOSCOPICALLY AND LEFT INTERNAL MAMMARY ARTERY. (Chest) TRANSESOPHAGEAL ECHOCARDIOGRAM (TEE) APPLICATION OF CELL SAVER     Patient location during evaluation: SICU Anesthesia Type: General Level of consciousness: sedated Pain management: pain level controlled Vital Signs Assessment: post-procedure vital signs reviewed and stable Respiratory status: patient remains intubated per anesthesia plan Cardiovascular status: stable Postop Assessment: no apparent nausea or vomiting Anesthetic complications: no   No notable events documented.  Last Vitals:  Vitals:   03/17/21 1645 03/17/21 1700  BP:  105/67  Pulse: 84 84  Resp: 13 13  Temp: 36.5 C 36.5 C  SpO2: 96% 97%    Last Pain:  Vitals:   03/17/21 1500  TempSrc: Core  PainSc:                  Brian Brewer

## 2021-03-17 NOTE — Procedures (Signed)
Extubation Procedure Note  Patient Details:   Name: Brian Brewer DOB: 1965-11-27 MRN: [id number]   Airway Documentation:    Vent end date: (not recorded) Vent end time: (not recorded)   Evaluation  O2 sats: stable throughout Complications: No apparent complications Patient did tolerate procedure well. Bilateral Breath Sounds: Clear   Yes  Lung mechanics within normal range, MD at bedside. Verbal order OK to extubate.  Esperanza Sheets T 03/17/2021, 4:50 PM

## 2021-03-17 NOTE — Progress Notes (Signed)
Chaplain made return visit to Brian Brewer prior to him going down for surgery.  Chaplain had prayer at bedside for pt and his family.  [place]

## 2021-03-17 NOTE — Anesthesia Procedure Notes (Signed)
Arterial Line Insertion Start/End2/15/2023 8:05 AM, 03/17/2021 8:10 AM Performed by: Barrington Ellison, CRNA  Preanesthetic checklist: patient identified, IV checked and risks and benefits discussed Lidocaine 1% used for infiltration and patient sedated Left, radial was placed Catheter size: 20 G Hand hygiene performed  and maximum sterile barriers used  Allen's test indicative of satisfactory collateral circulation Attempts: 2 Procedure performed using ultrasound guided technique. Ultrasound Notes:anatomy identified, needle tip was noted to be adjacent to the nerve/plexus identified and no ultrasound evidence of intravascular and/or intraneural injection Following insertion, dressing applied and Biopatch. Post procedure assessment: normal  Patient tolerated the procedure well with no immediate complications.

## 2021-03-17 NOTE — Anesthesia Procedure Notes (Signed)
Procedure Name: Intubation Date/Time: 03/17/2021 8:44 AM Performed by: Barrington Ellison, CRNA Pre-anesthesia Checklist: Patient identified, Emergency Drugs available, Suction available and Patient being monitored Patient Re-evaluated:Patient Re-evaluated prior to induction Oxygen Delivery Method: Circle System Utilized Preoxygenation: Pre-oxygenation with 100% oxygen Induction Type: IV induction Ventilation: Mask ventilation without difficulty Laryngoscope Size: Mac and 4 Grade View: Grade III Tube type: Oral Tube size: 8.0 mm Number of attempts: 1 Airway Equipment and Method: Stylet and Oral airway Placement Confirmation: ETT inserted through vocal cords under direct vision, positive ETCO2 and breath sounds checked- equal and bilateral Secured at: 23 cm Tube secured with: Tape Dental Injury: Teeth and Oropharynx as per pre-operative assessment

## 2021-03-18 ENCOUNTER — Inpatient Hospital Stay (HOSPITAL_COMMUNITY): Payer: Commercial Managed Care - PPO

## 2021-03-18 ENCOUNTER — Encounter (HOSPITAL_COMMUNITY): Payer: Self-pay | Admitting: Cardiothoracic Surgery

## 2021-03-18 LAB — GLUCOSE, CAPILLARY
Glucose-Capillary: 123 mg/dL — ABNORMAL HIGH (ref 70–99)
Glucose-Capillary: 110 mg/dL — ABNORMAL HIGH (ref 70–99)
Glucose-Capillary: 138 mg/dL — ABNORMAL HIGH (ref 70–99)
Glucose-Capillary: 139 mg/dL — ABNORMAL HIGH (ref 70–99)
Glucose-Capillary: 145 mg/dL — ABNORMAL HIGH (ref 70–99)
Glucose-Capillary: 109 mg/dL — ABNORMAL HIGH (ref 70–99)
Glucose-Capillary: 121 mg/dL — ABNORMAL HIGH (ref 70–99)
Glucose-Capillary: 119 mg/dL — ABNORMAL HIGH (ref 70–99)

## 2021-03-18 LAB — COOXEMETRY PANEL
Carboxyhemoglobin: <2.2 % — ABNORMAL HIGH (ref 0.5–1.5)
Methemoglobin: 0.4 % (ref 0.0–1.5)
O2 Saturation: 87.8 %
Total hemoglobin: 12 g/dL (ref 12.0–16.0)

## 2021-03-18 LAB — CBC
HCT: 34.3 % — ABNORMAL LOW (ref 39.0–52.0)
HCT: 33.2 % — ABNORMAL LOW (ref 39.0–52.0)
Hemoglobin: 11.9 g/dL — ABNORMAL LOW (ref 13.0–17.0)
Hemoglobin: 11.6 g/dL — ABNORMAL LOW (ref 13.0–17.0)
MCH: 31.2 pg (ref 26.0–34.0)
MCH: 31.3 pg (ref 26.0–34.0)
MCHC: 34.7 g/dL (ref 30.0–36.0)
MCHC: 34.9 g/dL (ref 30.0–36.0)
MCV: 89.8 fL (ref 80.0–100.0)
MCV: 89.5 fL (ref 80.0–100.0)
Platelets: 139 10*3/uL — ABNORMAL LOW (ref 150–400)
Platelets: 140 10*3/uL — ABNORMAL LOW (ref 150–400)
RBC: 3.82 MIL/uL — ABNORMAL LOW (ref 4.22–5.81)
RBC: 3.71 MIL/uL — ABNORMAL LOW (ref 4.22–5.81)
RDW: 13.2 % (ref 11.5–15.5)
RDW: 13.2 % (ref 11.5–15.5)
WBC: 10.1 10*3/uL (ref 4.0–10.5)
WBC: 9.9 10*3/uL (ref 4.0–10.5)
nRBC: 0 % (ref 0.0–0.2)
nRBC: 0 % (ref 0.0–0.2)

## 2021-03-18 LAB — BASIC METABOLIC PANEL
Anion gap: 7 (ref 5–15)
Anion gap: 7 (ref 5–15)
BUN: 8 mg/dL (ref 6–20)
BUN: 12 mg/dL (ref 6–20)
CO2: 22 mmol/L (ref 22–32)
CO2: 25 mmol/L (ref 22–32)
Calcium: 7.9 mg/dL — ABNORMAL LOW (ref 8.9–10.3)
Calcium: 8.2 mg/dL — ABNORMAL LOW (ref 8.9–10.3)
Chloride: 110 mmol/L (ref 98–111)
Chloride: 105 mmol/L (ref 98–111)
Creatinine, Ser: 1.03 mg/dL (ref 0.61–1.24)
Creatinine, Ser: 1.02 mg/dL (ref 0.61–1.24)
GFR, Estimated: >60 mL/min (ref >60)
GFR, Estimated: >60 mL/min (ref >60)
Glucose, Bld: 118 mg/dL — ABNORMAL HIGH (ref 70–99)
Glucose, Bld: 132 mg/dL — ABNORMAL HIGH (ref 70–99)
Potassium: 3.9 mmol/L (ref 3.5–5.1)
Potassium: 3.5 mmol/L (ref 3.5–5.1)
Sodium: 139 mmol/L (ref 135–145)
Sodium: 137 mmol/L (ref 135–145)

## 2021-03-18 LAB — MAGNESIUM
Magnesium: 2.5 mg/dL — ABNORMAL HIGH (ref 1.7–2.4)
Magnesium: 2.4 mg/dL (ref 1.7–2.4)

## 2021-03-18 MED ORDER — SALINE SPRAY 0.65 % NA SOLN
1.0000 | NASAL | Status: DC | PRN
  Filled 2021-03-18: qty 44

## 2021-03-18 MED ORDER — FUROSEMIDE 10 MG/ML IJ SOLN
20.0000 mg | Freq: Two times a day (BID) | INTRAMUSCULAR | Status: DC
  Administered 2021-03-18 – 2021-03-19 (×3): 20 mg via INTRAVENOUS
  Filled 2021-03-18 (×3): qty 2

## 2021-03-18 MED ORDER — LIDOCAINE 5 % EX PTCH
1.0000 | MEDICATED_PATCH | CUTANEOUS | Status: DC
  Administered 2021-03-18 – 2021-03-20 (×3): 1 via TRANSDERMAL
  Filled 2021-03-18 (×3): qty 1

## 2021-03-18 MED ORDER — LISINOPRIL 10 MG PO TABS
10.0000 mg | ORAL_TABLET | Freq: Every day | ORAL | Status: DC
Start: 2021-03-18 — End: 2021-03-21
  Administered 2021-03-18 – 2021-03-21 (×4): 10 mg via ORAL
  Filled 2021-03-18 (×4): qty 1

## 2021-03-18 MED ORDER — POTASSIUM CHLORIDE CRYS ER 20 MEQ PO TBCR
20.0000 meq | EXTENDED_RELEASE_TABLET | ORAL | Status: AC
  Administered 2021-03-18 – 2021-03-19 (×3): 20 meq via ORAL
  Filled 2021-03-18 (×2): qty 1

## 2021-03-18 MED ORDER — INSULIN ASPART 100 UNIT/ML IJ SOLN
0.0000 [IU] | INTRAMUSCULAR | Status: DC
  Administered 2021-03-18 (×2): 2 [IU] via SUBCUTANEOUS

## 2021-03-18 MED ORDER — GABAPENTIN 100 MG PO CAPS
200.0000 mg | ORAL_CAPSULE | Freq: Two times a day (BID) | ORAL | Status: DC
  Administered 2021-03-18 – 2021-03-21 (×7): 200 mg via ORAL
  Filled 2021-03-18 (×7): qty 2

## 2021-03-18 MED ORDER — NAPHAZOLINE-GLYCERIN 0.012-0.25 % OP SOLN
1.0000 [drp] | Freq: Four times a day (QID) | OPHTHALMIC | Status: DC | PRN
  Filled 2021-03-18: qty 15

## 2021-03-18 MED ORDER — GUAIFENESIN ER 600 MG PO TB12
600.0000 mg | ORAL_TABLET | Freq: Two times a day (BID) | ORAL | Status: DC
  Administered 2021-03-18 – 2021-03-21 (×7): 600 mg via ORAL
  Filled 2021-03-18 (×7): qty 1

## 2021-03-18 MED ORDER — METOPROLOL TARTRATE 25 MG PO TABS
25.0000 mg | ORAL_TABLET | Freq: Two times a day (BID) | ORAL | Status: DC
  Administered 2021-03-18 – 2021-03-21 (×7): 25 mg via ORAL
  Filled 2021-03-18 (×7): qty 1

## 2021-03-18 MED ORDER — INSULIN ASPART 100 UNIT/ML IJ SOLN: 0.0000 [IU] | INTRAMUSCULAR | Status: DC

## 2021-03-18 MED ORDER — POTASSIUM CHLORIDE CRYS ER 20 MEQ PO TBCR
20.0000 meq | EXTENDED_RELEASE_TABLET | Freq: Once | ORAL | Status: AC
  Administered 2021-03-18: 20 meq via ORAL
  Filled 2021-03-18: qty 1

## 2021-03-18 MED ORDER — KETOROLAC TROMETHAMINE 30 MG/ML IJ SOLN
30.0000 mg | Freq: Once | INTRAMUSCULAR | Status: AC
  Administered 2021-03-18: 30 mg via INTRAVENOUS
  Filled 2021-03-18: qty 1

## 2021-03-18 MED FILL — Mannitol IV Soln 20%: INTRAVENOUS | Qty: 500 | Status: AC

## 2021-03-18 MED FILL — Lidocaine HCl (Cardiac) IV PF Soln 100 MG/5ML (2%): INTRAVENOUS | Qty: 5 | Status: AC

## 2021-03-18 MED FILL — Sodium Chloride IV Soln 0.9%: INTRAVENOUS | Qty: 3000 | Status: AC

## 2021-03-18 MED FILL — Sodium Bicarbonate IV Soln 8.4%: INTRAVENOUS | Qty: 50 | Status: AC

## 2021-03-18 MED FILL — Potassium Chloride Inj 2 mEq/ML: INTRAVENOUS | Qty: 40 | Status: AC

## 2021-03-18 MED FILL — Electrolyte-R (PH 7.4) Solution: INTRAVENOUS | Qty: 4000 | Status: AC

## 2021-03-18 MED FILL — Heparin Sodium (Porcine) Inj 1000 Unit/ML: INTRAMUSCULAR | Qty: 10 | Status: AC

## 2021-03-18 MED FILL — Heparin Sodium (Porcine) Inj 1000 Unit/ML: Qty: 1000 | Status: AC

## 2021-03-18 MED FILL — Magnesium Sulfate Inj 50%: INTRAMUSCULAR | Qty: 10 | Status: AC

## 2021-03-18 NOTE — Progress Notes (Signed)
1 Day Post-Op Procedure(s) (LRB): CORONARY ARTERY BYPASS GRAFTING (CABG) X THREE USING RIGHT GREATER SAPHENOUS VEIN HARVESTED ENDOSCOPICALLY AND LEFT INTERNAL MAMMARY ARTERY. (N/A) TRANSESOPHAGEAL ECHOCARDIOGRAM (TEE) (N/A) APPLICATION OF CELL SAVER Subjective: No complaints  Nsr Stable overnight- off drips  Objective: Vital signs in last 24 hours: Temp:  [96.6 F (35.9 C)-98.8 F (37.1 C)] 98.1 F (36.7 C) (02/16 0730) Pulse Rate:  [52-107] 91 (02/16 0730) Cardiac Rhythm: Atrial paced (02/16 0400) Resp:  [12-27] 20 (02/16 0730) BP: (97-138)/(63-90) 118/70 (02/16 0700) SpO2:  [89 %-100 %] 95 % (02/16 0730) Arterial Line BP: (95-196)/(45-102) 127/49 (02/16 0730) FiO2 (%):  [40 %-50 %] 40 % (02/15 1627) Weight:  [97.4 kg] 97.4 kg (02/16 0500)  Hemodynamic parameters for last 24 hours: PAP: (11-59)/(5-42) 18/6 CO:  [4.1 L/min-7.1 L/min] 7.1 L/min CI:  [2 L/min/m2-3.4 L/min/m2] 3.4 L/min/m2  Intake/Output from previous day: 02/15 0701 - 02/16 0700 In: 5270.1 [P.O.:240; I.V.:2994; Blood:400; IV Piggyback:1636.1] Out: 8182 [Urine:3200; Blood:558; Chest Tube:760] Intake/Output this shift: No intake/output data recorded.       Exam    General- alert and comfortable    Neck- no JVD, no cervical adenopathy palpable, no carotid bruit   Lungs- clear without rales, wheezes   Cor- regular rate and rhythm, no murmur , gallop   Abdomen- soft, non-tender   Extremities - warm, non-tender, minimal edema   Neuro- oriented, appropriate, no focal weakness   Lab Results: Recent Labs    03/17/21 2000 03/17/21 2244 03/18/21 0133  WBC 11.1*  --  10.1  HGB 12.5* 11.6* 11.9*  HCT 36.4* 34.0* 34.3*  PLT 144*  --  139*   BMET:  Recent Labs    03/17/21 2000 03/17/21 2244 03/18/21 0133  NA 137 141 139  K 4.2 4.0 3.9  CL 107  --  110  CO2 21*  --  22  GLUCOSE 116*  --  118*  BUN 10  --  8  CREATININE 1.02  --  1.03  CALCIUM 7.7*  --  7.9*    PT/INR:  Recent Labs     03/17/21 1426  LABPROT 15.6*  INR 1.2   ABG    Component Value Date/Time   PHART 7.446 03/17/2021 2244   HCO3 22.5 03/17/2021 2244   TCO2 23 03/17/2021 2244   ACIDBASEDEF 1.0 03/17/2021 2244   O2SAT 87.8 03/18/2021 0518   CBG (last 3)  Recent Labs    03/18/21 0330 03/18/21 0625 03/18/21 0628  GLUCAP 110* 139* 138*    Assessment/Plan: S/P Procedure(s) (LRB): CORONARY ARTERY BYPASS GRAFTING (CABG) X THREE USING RIGHT GREATER SAPHENOUS VEIN HARVESTED ENDOSCOPICALLY AND LEFT INTERNAL MAMMARY ARTERY. (N/A) TRANSESOPHAGEAL ECHOCARDIOGRAM (TEE) (N/A) APPLICATION OF CELL SAVER Mobilize Diuresis Diabetes control See progression orders Leave chest tubes in for now   LOS: 3 days    Dahlia Byes 03/18/2021

## 2021-03-18 NOTE — Progress Notes (Signed)
Patient ID: Brian Brewer, male   DOB: 08-24-1965, 56 y.o.   MRN: [id number] [facility] Evening Rounds:  Hemodynamically stable, atrial paced 80  Sats 97% 2L [place]  Good urine output today.  CT's 30/hr.  BMET    Component Value Date/Time   NA 137 03/18/2021 1737   NA 139 12/24/2019 0802   K 3.5 03/18/2021 1737   CL 105 03/18/2021 1737   CO2 25 03/18/2021 1737   GLUCOSE 132 (H) 03/18/2021 1737   BUN 12 03/18/2021 1737   BUN 12 12/24/2019 0802   CREATININE 1.02 03/18/2021 1737   CREATININE 1.08 02/14/2014 0000   CALCIUM 8.2 (L) 03/18/2021 1737   GFRNONAA >60 03/18/2021 1737   CBC    Component Value Date/Time   WBC 9.9 03/18/2021 1737   RBC 3.71 (L) 03/18/2021 1737   HGB 11.6 (L) 03/18/2021 1737   HGB 15.9 08/25/2014 1124   HCT 33.2 (L) 03/18/2021 1737   HCT 46.6 08/25/2014 1124   PLT 140 (L) 03/18/2021 1737   PLT 226 08/25/2014 1124   MCV 89.5 03/18/2021 1737   MCV 88 08/25/2014 1124   MCH 31.3 03/18/2021 1737   MCHC 34.9 03/18/2021 1737   RDW 13.2 03/18/2021 1737   RDW 14.7 08/25/2014 1124   LYMPHSABS 2.4 08/25/2014 1124   MONOABS 0.8 02/14/2014 0000   EOSABS 0.2 08/25/2014 1124   BASOSABS 0.1 08/25/2014 1124

## 2021-03-18 NOTE — Discharge Summary (Addendum)
Physician Discharge Summary  Patient ID: JC VERON MRN: [id number] DOB/AGE: [birth year] 56 y.o.  Admit date: 03/15/2021 Discharge date: 03/21/2021  Admission Diagnoses:  Multivessel coronary artery disease Acute non-ST elevation myocardial infarction Hypertension Dyslipidemia History of GERD  Discharge Diagnoses:   Multivessel coronary artery disease Acute non-ST elevation myocardial infarction Hypertension Dyslipidemia History of GERD S/P CABG x 3 Mild expected acute blood loss anemia Mild postoperative respiratory insufficiency  Discharged Condition: good    History of Present Illness:    At time of cardiothoracic surgical consultation This is a 56 year old male with a past medical history of hyperlipidemia, hypertension, tobacco abuse, family history (father had heart attack in his 71's and his grandmother as well), GERD, melanoma (on back), and psoriatic arthritis (on Rinvoq) who had COVID about 8 weeks ago. Patient had mostly upper respiratory symptoms and cough. He was then chopping wood when he developed chest discomfort and shortness of breath. He thought this might be from recent COVID infection. On 03/15/2021 he was picking sticks up in the yard when he had severe, burning like chest pain and shortness of breath. He denied syncope, nausea, LE edema, or abdominal pain. He initially presented to [facility]. EKG was done and results indicated possible cardiac ischemia. He was then instructed to go to [facility] ED for further evaluation and treatment. EKG showed TWI in aVL and sub millimeter STE in leads II and III. His initial Troponin was 45. He ruled in for a NSTEMI. He was put on a Heparin drip and transferred to [facility]. He underwent a cardiac catheterization  which showed 95% ostial to proximal LAD stenosis just before the origin of the first Diagonal, chronic total occlusion of the LAD after the first Diagonal, 40-50% OM2, and RCA with 60-70%  eccentric proximal stenosis. Dr. Prescott Gum has been consulted for the consideration of coronary artery bypass grafting surgery. At the time of my exam, his wife, daughter, and son were at bedside. Patient denies chest pain or shortness of breath. Vital signs are HR 82, BP 134/82.  The patient and his studies were evaluated by Dr.Van Trigt who  recommended proceeding with CABG.  Hospital Course: The patient was felt to be medically stable to proceed and on 03/17/2021 he was taken to the operating room at which time he underwent coronary artery bypass grafting x3.  He tolerated the procedure well was taken the surgical intensive care unit in stable condition.  Vital signs and hemodynamics remained stable.  He was weaned from mechanical ventilator and extubated routinely by late afternoon on the day of surgery.  By the morning of the first postoperative day, he was off all vasoactive drips.  Monitoring lines were removed and he was mobilized.  Chest tubes were left on the first day postop for drainage.  He does have an expected postoperative volume overload but is responding well to diuretics.  Renal function has remained within normal limits.  Blood sugars have been under good control using standard protocols.  His hemoglobin A1c preoperatively was 5.7 on no medications.  He does have an expected acute blood loss anemia which has been trended over time.  Values are fairly stable.  Most recent hemoglobin hematocrit dated 03/19/21 were 10.7 and 31.5 respectively.  All routine lines, monitors and drainage devices have been discontinued in the standard fashion.  He was transferred to the 4 E. telemetry unit for ongoing rehabilitation on postoperative day #2.  We plan to start Plavix at the  time of discharge.  The patient's EPW were removed without difficulty.  He was mildly hypertensive and restarted on his home HCTZ/Lisinopril combo.  He is maintaining NSR.  He is ambulating independently.  His surgical incisions are  healing without evidence of infection.  He is medically stable for discharge home today.    Consults: None  Significant Diagnostic Studies: angiography:   CONCLUSIONS: Ostial to proximal LAD 95% stenosis before the origin of diagonal #1.  Chronic total occlusion of the LAD after the first diagonal.  First diagonal contains segmental 50% narrowing.   Widely patent left main. 40 to 50% second obtuse marginal. Dominant RCA with 60-70 % eccentric proximal stenosis.  The vessel is dominant and supplies right to left collaterals to LAD.  The LAD appears to be a large target that is graftable. RECOMMENDATIONS: Revascularization is needed.  Best long-term approach would be LIMA to LAD and saphenous vein graft to diagonal plus minus RCA.  There could potentially be a percutaneous approach although it would include a chronic total occlusion bifurcation stenosis in the proximal to mid LAD. Recommend surgical evaluation and interventional opinions from team.  Treatments: surgery:   Operative Report    DATE OF PROCEDURE: 03/17/2021   PROCEDURES PERFORMED: 1.  Coronary artery bypass grafting x3 (left internal mammary artery to left anterior descending, saphenous vein graft to posterior descending, saphenous vein graft to first diagonal). 2.  Endoscopic harvest of right leg greater saphenous vein.   PREOPERATIVE DIAGNOSES:  Class 4 unstable angina, non-ST elevation myocardial infarction.   POSTOPERATIVE DIAGNOSES:  Class 4 unstable angina, non-ST elevation myocardial infarction.   SURGEON:  Len Childs, MD   ASSISTANT:  Jadene Pierini, PA-C.  A surgical first assistant was needed for this procedure due to the standard of care for this type of cardiac surgery.  The first assistant was needed for endoscopic harvest of the greater saphenous vein, closure of the leg  incisions, assistance with the distal anastomoses, general exposure, suction, and overall assistance.  Discharge Exam: Blood  pressure 122/71, pulse 74, temperature 98.1 F (36.7 C), temperature source Oral, resp. rate 15, height 5\' 9"  (1.753 m), weight 95.3 kg, SpO2 91 %.    General appearance: alert, cooperative, and no distress Heart: regular rate and rhythm Lungs: clear to auscultation bilaterally Abdomen: soft, non-tender; bowel sounds normal; no masses,  no organomegaly Extremities: edema trace Wound: clean and dry  Discharge disposition: 01-Home or Self Care       Discharge Instructions     AMB Referral to Cardiac Rehabilitation - Phase II   Complete by: As directed    Diagnosis: CABG   CABG X ___: 3   After initial evaluation and assessments completed: Virtual Based Care may be provided alone or in conjunction with Phase 2 Cardiac Rehab based on patient barriers.: Yes   Amb Referral to Cardiac Rehabilitation   Complete by: As directed    Diagnosis:  CABG NSTEMI     CABG X ___: 3   After initial evaluation and assessments completed: Virtual Based Care may be provided alone or in conjunction with Phase 2 Cardiac Rehab based on patient barriers.: Yes      Allergies as of 03/21/2021       Reactions   Amoxicillin-pot Clavulanate Other (See Comments)   Cramps and diarrhea Has patient had a PCN reaction causing immediate rash, facial/tongue/throat swelling, SOB or lightheadedness with hypotension: No Has patient had a PCN reaction causing severe rash involving mucus membranes or  skin necrosis: No Has patient had a PCN reaction that required hospitalization: No Has patient had a PCN reaction occurring within the last 10 years: No If all of the above answers are "NO", then may proceed with Cephalosporin use.   Levofloxacin Other (See Comments)   Passed out (possibly due to medication)   Zithromax [azithromycin] Diarrhea   Chantix [varenicline Tartrate] Nausea And Vomiting   Oxycodone Diarrhea, Itching, Anxiety        Medication List     TAKE these medications    allopurinol 100 MG  tablet Commonly known as: ZYLOPRIM TAKE ONE TABLET BY MOUTH ONCE DAILY. What changed:  how much to take how to take this when to take this   aspirin 325 MG EC tablet Take 1 tablet (325 mg total) by mouth daily.   esomeprazole 40 MG capsule Commonly known as: NEXIUM Take 1 capsule (40 mg total) by mouth daily.   gabapentin 100 MG capsule Commonly known as: NEURONTIN Take 2 capsules (200 mg total) by mouth 2 (two) times daily.   HYDROcodone-acetaminophen 5-325 MG tablet Commonly known as: NORCO/VICODIN Take 1 tablet by mouth every 4 (four) hours as needed for moderate pain.   ipratropium 0.03 % nasal spray Commonly known as: ATROVENT Place 2 sprays into both nostrils 3 (three) times daily.   lisinopril-hydrochlorothiazide 10-12.5 MG tablet Commonly known as: ZESTORETIC Take 1 tablet by mouth daily.   metoprolol tartrate 25 MG tablet Commonly known as: LOPRESSOR Take 1 tablet (25 mg total) by mouth 2 (two) times daily.   Rinvoq 15 MG Tb24 Generic drug: Upadacitinib ER Take 1 tablet by mouth daily.   rosuvastatin 40 MG tablet Commonly known as: Crestor Take 1 tablet (40 mg total) by mouth daily. What changed:  medication strength how much to take   varenicline 1 MG tablet Commonly known as: Chantix Continuing Month Pak Take one tablet po BID   Chantix Starting Month Pak 0.5 MG X 11 & 1 MG X 42 Tbpk Generic drug: Varenicline Tartrate (Starter) Take 0.5 mg by mouth daily. Day 1-3 take 0.5 mg po daily; day 4-7 0.5 mg po BID. Day 8-31 1 mg po daily        Follow-up Information     Lenna Sciara, NP Follow up on 04/12/2021.   Specialties: Nurse Practitioner, Family Medicine Why: Appointment is at [facility] information: [address] [place] [address] [phone]         Dahlia Byes, MD Follow up on 04/19/2021.   Specialty: Cardiothoracic Surgery Why: Appointment is at 3:30, please get CXR at 3:00 at [facility] located on  first floor of our office building              The patient has been discharged on:   1.Beta Blocker:  Yes [ X  ]                              No   [   ]                              If No, reason:  2.Ace Inhibitor/ARB: Yes [ X  ]                                     No  [    ]  If No, reason:  3.Statin:   Yes [ X  ]                  No  [   ]                  If No, reason:  4.Ecasa:  Yes  [ X  ]                  No   [   ]                  If No, reason:  5. P2Y12 Inhibitor:  Yes  [ X ]  (AcuteNSTEMI)   Signed:  Erin Barrett, PA-C 03/21/2021, 8:07 AM  Hold Rinvoq for 3 weeks to allow proper wound healing Plan plavix for 6 months DC instructions reviewed with patient today over phone including importance of smoking cessation

## 2021-03-18 NOTE — Care Management (Signed)
°  Transition of Care [facility]) Screening Note   Patient Details  Name: Brian Brewer Date of Birth: August 01, 1965   Transition of Care [facility]) CM/SW Contact:    Bethena Roys, RN Phone Number: 03/18/2021, 4:17 PM    Transition of Care Department [facility]) has reviewed the patient and no TOC needs have been identified at this time. We will continue to monitor patient advancement through interdisciplinary progression rounds. If new patient transition needs arise, please place a TOC consult.

## 2021-03-19 ENCOUNTER — Inpatient Hospital Stay (HOSPITAL_COMMUNITY): Payer: Commercial Managed Care - PPO

## 2021-03-19 LAB — BASIC METABOLIC PANEL
Anion gap: 5 (ref 5–15)
BUN: 16 mg/dL (ref 6–20)
CO2: 24 mmol/L (ref 22–32)
Calcium: 7.9 mg/dL — ABNORMAL LOW (ref 8.9–10.3)
Chloride: 107 mmol/L (ref 98–111)
Creatinine, Ser: 1.08 mg/dL (ref 0.61–1.24)
GFR, Estimated: >60 mL/min (ref >60)
Glucose, Bld: 108 mg/dL — ABNORMAL HIGH (ref 70–99)
Potassium: 3.9 mmol/L (ref 3.5–5.1)
Sodium: 136 mmol/L (ref 135–145)

## 2021-03-19 LAB — CBC
HCT: 31.5 % — ABNORMAL LOW (ref 39.0–52.0)
Hemoglobin: 10.7 g/dL — ABNORMAL LOW (ref 13.0–17.0)
MCH: 31.2 pg (ref 26.0–34.0)
MCHC: 34 g/dL (ref 30.0–36.0)
MCV: 91.8 fL (ref 80.0–100.0)
Platelets: 132 10*3/uL — ABNORMAL LOW (ref 150–400)
RBC: 3.43 MIL/uL — ABNORMAL LOW (ref 4.22–5.81)
RDW: 13.3 % (ref 11.5–15.5)
WBC: 11.3 10*3/uL — ABNORMAL HIGH (ref 4.0–10.5)
nRBC: 0 % (ref 0.0–0.2)

## 2021-03-19 LAB — GLUCOSE, CAPILLARY
Glucose-Capillary: 116 mg/dL — ABNORMAL HIGH (ref 70–99)
Glucose-Capillary: 110 mg/dL — ABNORMAL HIGH (ref 70–99)
Glucose-Capillary: 115 mg/dL — ABNORMAL HIGH (ref 70–99)
Glucose-Capillary: 112 mg/dL — ABNORMAL HIGH (ref 70–99)

## 2021-03-19 MED ORDER — SODIUM CHLORIDE 0.9% FLUSH
3.0000 mL | INTRAVENOUS | Status: DC | PRN
  Administered 2021-03-19: 3 mL via INTRAVENOUS

## 2021-03-19 MED ORDER — ~~LOC~~ CARDIAC SURGERY, PATIENT & FAMILY EDUCATION: Freq: Once | Status: AC

## 2021-03-19 MED ORDER — FUROSEMIDE 10 MG/ML IJ SOLN
20.0000 mg | Freq: Two times a day (BID) | INTRAMUSCULAR | Status: AC
Start: 2021-03-19 — End: 2021-03-19
  Administered 2021-03-19: 20 mg via INTRAVENOUS
  Filled 2021-03-19: qty 2

## 2021-03-19 MED ORDER — FENTANYL CITRATE PF 50 MCG/ML IJ SOSY: 50.0000 ug | PREFILLED_SYRINGE | Freq: Four times a day (QID) | INTRAMUSCULAR | Status: DC | PRN

## 2021-03-19 MED ORDER — LEVALBUTEROL HCL 1.25 MG/0.5ML IN NEBU
1.2500 mg | INHALATION_SOLUTION | Freq: Three times a day (TID) | RESPIRATORY_TRACT | Status: DC
  Administered 2021-03-20: 1.25 mg via RESPIRATORY_TRACT
  Filled 2021-03-19: qty 0.5

## 2021-03-19 MED ORDER — ENOXAPARIN SODIUM 40 MG/0.4ML IJ SOSY
40.0000 mg | PREFILLED_SYRINGE | Freq: Every day | INTRAMUSCULAR | Status: DC
  Administered 2021-03-19 – 2021-03-20 (×2): 40 mg via SUBCUTANEOUS
  Filled 2021-03-19 (×2): qty 0.4

## 2021-03-19 MED ORDER — LEVALBUTEROL HCL 1.25 MG/0.5ML IN NEBU: 1.2500 mg | INHALATION_SOLUTION | Freq: Four times a day (QID) | RESPIRATORY_TRACT | Status: DC | PRN

## 2021-03-19 MED ORDER — INSULIN ASPART 100 UNIT/ML IJ SOLN: 0.0000 [IU] | Freq: Three times a day (TID) | INTRAMUSCULAR | Status: DC

## 2021-03-19 MED ORDER — MAGNESIUM HYDROXIDE 400 MG/5ML PO SUSP: 30.0000 mL | Freq: Every day | ORAL | Status: DC | PRN

## 2021-03-19 MED ORDER — ALUM & MAG HYDROXIDE-SIMETH 200-200-20 MG/5ML PO SUSP
15.0000 mL | Freq: Four times a day (QID) | ORAL | Status: DC | PRN
  Administered 2021-03-19: 15 mL via ORAL
  Filled 2021-03-19: qty 30

## 2021-03-19 MED ORDER — FUROSEMIDE 40 MG PO TABS
40.0000 mg | ORAL_TABLET | Freq: Every day | ORAL | Status: DC
  Administered 2021-03-20 – 2021-03-21 (×2): 40 mg via ORAL
  Filled 2021-03-19 (×2): qty 1

## 2021-03-19 MED ORDER — SODIUM CHLORIDE 0.9 % IV SOLN: 250.0000 mL | INTRAVENOUS | Status: DC | PRN

## 2021-03-19 MED ORDER — SODIUM CHLORIDE 0.9% FLUSH: 3.0000 mL | Freq: Two times a day (BID) | INTRAVENOUS | Status: DC

## 2021-03-19 NOTE — Progress Notes (Signed)
° °   °  [address]       [place] [address]             [phone]      POD # 2 CABG  No complaints  BP 117/73    Pulse 78    Temp 98.9 F (37.2 C) (Oral)    Resp 15    Ht 5\' 9"  (1.753 m)    Wt 97.3 kg    SpO2 99%    BMI 31.68 kg/m  2L [place] Intake/Output Summary (Last 24 hours) at 03/19/2021 1644 Last data filed at 03/19/2021 0900 Gross per 24 hour  Intake 640 ml  Output 1855 ml  Net -1215 ml   CBG well controlled  Awaiting bed on 4E  Maylene Crocker C. Roxan Hockey, MD [facility] [phone]

## 2021-03-19 NOTE — Progress Notes (Signed)
2 Days Post-Op Procedure(s) (LRB): CORONARY ARTERY BYPASS GRAFTING (CABG) X THREE USING RIGHT GREATER SAPHENOUS VEIN HARVESTED ENDOSCOPICALLY AND LEFT INTERNAL MAMMARY ARTERY. (N/A) TRANSESOPHAGEAL ECHOCARDIOGRAM (TEE) (N/A) APPLICATION OF CELL SAVER Subjective: Doing well Objective: Vital signs in last 24 hours: Temp:  [98 F (36.7 C)-98.9 F (37.2 C)] 98.9 F (37.2 C) (02/17 0727) Pulse Rate:  [71-101] 77 (02/17 0500) Cardiac Rhythm: Normal sinus rhythm (02/17 0700) Resp:  [14-29] 29 (02/17 0700) BP: (96-135)/(59-79) 96/67 (02/17 0700) SpO2:  [90 %-99 %] 90 % (02/17 0500) Arterial Line BP: (117-153)/(42-66) 143/56 (02/16 1900) Weight:  [97.3 kg] 97.3 kg (02/17 0632)  Hemodynamic parameters for last 24 hours: PAP: (18-38)/(3-21) 22/8  Intake/Output from previous day: 02/16 0701 - 02/17 0700 In: 869.1 [P.O.:420; I.V.:249; IV Piggyback:200.1] Out: 3405 [Urine:3080; Chest Tube:325] Intake/Output this shift: No intake/output data recorded.       Exam    General- alert and comfortable    Neck- no JVD, no cervical adenopathy palpable, no carotid bruit   Lungs- clear without rales, wheezes   Cor- regular rate and rhythm, no murmur , gallop   Abdomen- soft, non-tender   Extremities - warm, non-tender, minimal edema   Neuro- oriented, appropriate, no focal weakness   Lab Results: Recent Labs    03/18/21 1737 03/19/21 0338  WBC 9.9 11.3*  HGB 11.6* 10.7*  HCT 33.2* 31.5*  PLT 140* 132*   BMET:  Recent Labs    03/18/21 1737 03/19/21 0338  NA 137 136  K 3.5 3.9  CL 105 107  CO2 25 24  GLUCOSE 132* 108*  BUN 12 16  CREATININE 1.02 1.08  CALCIUM 8.2* 7.9*    PT/INR:  Recent Labs    03/17/21 1426  LABPROT 15.6*  INR 1.2   ABG    Component Value Date/Time   PHART 7.446 03/17/2021 2244   HCO3 22.5 03/17/2021 2244   TCO2 23 03/17/2021 2244   ACIDBASEDEF 1.0 03/17/2021 2244   O2SAT 87.8 03/18/2021 0518   CBG (last 3)  Recent Labs    03/18/21 1620  03/18/21 2017 03/19/21 0725  GLUCAP 121* 119* 116*    Assessment/Plan: S/P Procedure(s) (LRB): CORONARY ARTERY BYPASS GRAFTING (CABG) X THREE USING RIGHT GREATER SAPHENOUS VEIN HARVESTED ENDOSCOPICALLY AND LEFT INTERNAL MAMMARY ARTERY. (N/A) TRANSESOPHAGEAL ECHOCARDIOGRAM (TEE) (N/A) APPLICATION OF CELL SAVER Mobilize Diuresis Diabetes control d/c tubes/lines Plan for transfer to step-down: see transfer orders Start plavix at discharge   LOS: 4 days    Brian Brewer 03/19/2021

## 2021-03-20 ENCOUNTER — Inpatient Hospital Stay (HOSPITAL_COMMUNITY): Payer: Commercial Managed Care - PPO

## 2021-03-20 LAB — TYPE AND SCREEN
ABO/RH(D): A POS
Antibody Screen: NEGATIVE
Unit division: 0
Unit division: 0

## 2021-03-20 LAB — BASIC METABOLIC PANEL
Anion gap: 7 (ref 5–15)
BUN: 19 mg/dL (ref 6–20)
CO2: 26 mmol/L (ref 22–32)
Calcium: 8.1 mg/dL — ABNORMAL LOW (ref 8.9–10.3)
Chloride: 101 mmol/L (ref 98–111)
Creatinine, Ser: 1.07 mg/dL (ref 0.61–1.24)
GFR, Estimated: >60 mL/min (ref >60)
Glucose, Bld: 109 mg/dL — ABNORMAL HIGH (ref 70–99)
Potassium: 4 mmol/L (ref 3.5–5.1)
Sodium: 134 mmol/L — ABNORMAL LOW (ref 135–145)

## 2021-03-20 LAB — BPAM RBC
Blood Product Expiration Date: 202303042359
Blood Product Expiration Date: 202303042359
Unit Type and Rh: 6200
Unit Type and Rh: 6200

## 2021-03-20 LAB — CBC
HCT: 33.9 % — ABNORMAL LOW (ref 39.0–52.0)
Hemoglobin: 11.7 g/dL — ABNORMAL LOW (ref 13.0–17.0)
MCH: 31.3 pg (ref 26.0–34.0)
MCHC: 34.5 g/dL (ref 30.0–36.0)
MCV: 90.6 fL (ref 80.0–100.0)
Platelets: 157 10*3/uL (ref 150–400)
RBC: 3.74 MIL/uL — ABNORMAL LOW (ref 4.22–5.81)
RDW: 13.2 % (ref 11.5–15.5)
WBC: 11.7 10*3/uL — ABNORMAL HIGH (ref 4.0–10.5)
nRBC: 0 % (ref 0.0–0.2)

## 2021-03-20 LAB — GLUCOSE, CAPILLARY: Glucose-Capillary: 99 mg/dL (ref 70–99)

## 2021-03-20 MED ORDER — LACTULOSE 10 GM/15ML PO SOLN
20.0000 g | Freq: Every day | ORAL | Status: DC
Start: 2021-03-20 — End: 2021-03-21
  Administered 2021-03-20: 20 g via ORAL
  Filled 2021-03-20: qty 30

## 2021-03-20 NOTE — Progress Notes (Signed)
3 Days Post-Op Procedure(s) (LRB): CORONARY ARTERY BYPASS GRAFTING (CABG) X THREE USING RIGHT GREATER SAPHENOUS VEIN HARVESTED ENDOSCOPICALLY AND LEFT INTERNAL MAMMARY ARTERY. (N/A) TRANSESOPHAGEAL ECHOCARDIOGRAM (TEE) (N/A) APPLICATION OF CELL SAVER Subjective: No complaints this AM  Objective: Vital signs in last 24 hours: Temp:  [98.5 F (36.9 C)-100.1 F (37.8 C)] 98.7 F (37.1 C) (02/18 0735) Pulse Rate:  [69-86] 83 (02/18 0735) Cardiac Rhythm: Normal sinus rhythm (02/18 0400) Resp:  [13-22] 17 (02/18 0735) BP: (92-157)/(60-89) 126/70 (02/18 0735) SpO2:  [90 %-99 %] 97 % (02/18 0735) Weight:  [96.8 kg] 96.8 kg (02/18 0500)  Hemodynamic parameters for last 24 hours:    Intake/Output from previous day: 02/17 0701 - 02/18 0700 In: 850 [P.O.:850] Out: 1500 [Urine:1500] Intake/Output this shift: No intake/output data recorded.  General appearance: alert, cooperative, and no distress Neurologic: intact Heart: regular rate and rhythm Lungs: clear to auscultation bilaterally Abdomen: normal findings: soft, non-tender Wound: clean and dry  Lab Results: Recent Labs    03/19/21 0338 03/20/21 0100  WBC 11.3* 11.7*  HGB 10.7* 11.7*  HCT 31.5* 33.9*  PLT 132* 157   BMET:  Recent Labs    03/19/21 0338 03/20/21 0100  NA 136 134*  K 3.9 4.0  CL 107 101  CO2 24 26  GLUCOSE 108* 109*  BUN 16 19  CREATININE 1.08 1.07  CALCIUM 7.9* 8.1*    PT/INR:  Recent Labs    03/17/21 1426  LABPROT 15.6*  INR 1.2   ABG    Component Value Date/Time   PHART 7.446 03/17/2021 2244   HCO3 22.5 03/17/2021 2244   TCO2 23 03/17/2021 2244   ACIDBASEDEF 1.0 03/17/2021 2244   O2SAT 87.8 03/18/2021 0518   CBG (last 3)  Recent Labs    03/19/21 1643 03/19/21 2023 03/20/21 0641  GLUCAP 115* 112* 99    Assessment/Plan: S/P Procedure(s) (LRB): CORONARY ARTERY BYPASS GRAFTING (CABG) X THREE USING RIGHT GREATER SAPHENOUS VEIN HARVESTED ENDOSCOPICALLY AND LEFT INTERNAL MAMMARY  ARTERY. (N/A) TRANSESOPHAGEAL ECHOCARDIOGRAM (TEE) (N/A) APPLICATION OF CELL SAVER Plan for transfer to step-down: see transfer orders Awaiting bed on 4E CV- in Sr- dc pacing wires  On ASA, statin, beta blocker,. ACE-I RESP- CXR looks good, continue IS RENAL- creatinine and lytes OK  On PO Lasix ENDO- CBG normal this AM Gi- tolerating diet, but no BM yet- lactulose Continue cardiac rehab Possibly home tomorrow  LOS: 5 days    Brian Brewer 03/20/2021

## 2021-03-20 NOTE — Progress Notes (Signed)
CARDIAC REHAB PHASE I   [phone] After further chart review, anticipate possible D/C tomorrow. CR education completed with patient and family including review of [facility] booklet, diet, exercise, IS, and phase 2 cardiac rehab. With patient permission referral sent to [facility].   Veleta Yamamoto Minus Breeding RN, BSN

## 2021-03-20 NOTE — Progress Notes (Addendum)
Attempt to call report x3 since bed ready notification. 4E notes that receiving nurse and charge RN are busy in emergent situation and cannot take report at this time. Nurse has this RN's call back number. Will cont to try for report.   Report given at 1130, cardiac rehab currently completing education requirements. Will transfer pt to 4E01 after education completion.

## 2021-03-20 NOTE — Progress Notes (Signed)
Pacerwires removed per order. Pt instructed to remain in bed until 1020 and to notify nurse for any shortness of breath, chest pain or any other changes in physical status such as dizziness. VSS. Call bell within reach.

## 2021-03-20 NOTE — Progress Notes (Signed)
CARDIAC REHAB PHASE I   1120 CR RN received report from primary RN that patient has been ambulating multiple times in hallway for multiple laps this morning. Currently sitting up in chair with family at bedside. Ambulation deferred at this time. Transfer orders to 4E with bed assignment.  Jaylean Buenaventura Minus Breeding RN, BSN

## 2021-03-20 NOTE — Op Note (Signed)
NAME: Brian Brewer, Brian Brewer MEDICAL RECORD NO: [id number] ACCOUNT NO: [id number] DATE OF BIRTH: [birth year] FACILITY: [facility] LOCATION: [facility] PHYSICIAN: Len Childs, MD  Operative Report   DATE OF PROCEDURE: 03/17/2021  PROCEDURES PERFORMED: 1.  Coronary artery bypass grafting x3 (left internal mammary artery to left anterior descending, saphenous vein graft to posterior descending, saphenous vein graft to first diagonal). 2.  Endoscopic harvest of right leg greater saphenous vein.  PREOPERATIVE DIAGNOSES:  Class 4 unstable angina, non-ST elevation myocardial infarction.  POSTOPERATIVE DIAGNOSES:  Class 4 unstable angina, non-ST elevation myocardial infarction.  SURGEON:  Len Childs, MD  ASSISTANT:  Jadene Pierini, PA-C.  A surgical first assistant was needed for this procedure due to the standard of care for this type of cardiac surgery.  The first assistant was needed for endoscopic harvest of the greater saphenous vein, closure of the leg  incisions, assistance with the distal anastomoses, general exposure, suction, and overall assistance.  ANESTHESIA:  General.  CLINICAL NOTE:  The patient is a 56 year old gentleman who was seen for consultation following admission for acute coronary syndrome.  Cardiac catheterization demonstrated a chronically occluded LAD with a large diagonal involved as well as a dominant  right coronary with a moderate stenosis which collateralized to the LAD distribution.  The circumflex was smaller and had no significant disease.  Surgical revascularization was recommended.  I saw the patient in consultation.  After reviewing his  coronary arteriograms and echocardiogram, agreed that surgical coronary revascularization would be his best long-term therapy.  I discussed the procedure in detail with the patient and his wife including the use of general anesthesia and cardiopulmonary  bypass, the location of the surgical incisions, and the expected postoperative  hospital recovery.  I discussed with him the risks including the risks of bleeding, blood transfusion, stroke, infection, arrhythmias, organ failure, and death.  After  reviewing these issues, he demonstrated his understanding and agreed to proceed with surgery under what I felt was an informed consent.  OPERATIVE FINDINGS: 1.  Good conduit. 2.  LAD chronically occluded, but it was an adequate target.  The right coronary was heavily diseased to the bifurcation and the anastomosis was placed distal to the bifurcation.  The patient did not require any blood products.  DESCRIPTION OF PROCEDURE:  The patient was brought from preoperative holding where informed consent was documented and final issues were addressed with the patient in a face-to-face encounter.  The patient was then placed supine on the operating room  table and general anesthesia was induced under invasive hemodynamic monitoring.  The patient remained stable.  A transesophageal echo probe was placed by the anesthesia team.  The patient was then prepped and draped as a sterile field and a proper  timeout was performed.  A sternal incision was made as the saphenous vein was harvested endoscopically from the right leg.  The left internal mammary artery was harvested as a pedicle graft from its origin at the subclavian vessels.  It was a 1.5-2 mm  vessel with good flow.  Sternal retractor was placed with deep blades and the pericardium was opened and suspended.  Pursestrings were placed in the ascending aorta and right atrium and after heparin was administered, the patient was cannulated and  placed on bypass.  The coronaries were identified for grafting and the mammary artery and vein grafts were prepared for the distal anastomosis.  Cardioplegia cannulas were placed for both antegrade and retrograde cold blood cardioplegia.  The patient was  cooled to 32 degrees.  Aortic crossclamp was applied.  One liter of cold blood cardioplegia was  delivered in split doses between the antegrade aortic and retrograde coronary sinus catheters.  There was good cardioplegic arrest and septal temperature  dropped to less than 14 degrees.  Cardioplegia was delivered every 20 minutes.  The first distal anastomosis was performed.  This was to the posterior descending, which was a 1.5 mm vessel with proximal 65% stenosis.  A reverse saphenous vein was sewn end-to-side with running 7-0 Prolene with good flow through the graft.   Cardioplegia was redosed.  The second distal anastomosis was to the diagonal branch of LAD.  This was a 1.5 mm vessel with proximal 80% stenosis.  A reverse saphenous vein was sewn end-to-side with running 7-0 Prolene with good flow through the graft.  The third distal anastomosis was to the distal LAD.  It was chronically occluded.  The left IMA pedicle was brought through an opening and the pericardium was brought down onto the LAD and sewn end-to-side with running 8-0 Prolene.  There was good flow  through the anastomosis after briefly releasing the bulldog clamp on the mammary artery.  The bulldog was reapplied and the pedicle secured to the epicardium.  Cardioplegia was redosed.  While the cross-clamp was still in place, 2 proximal vein anastomoses were performed on the ascending aorta using a 4.5 mm punch and running 6-0 Prolene.  Prior to tying down the final proximal anastomosis, air was vented from the coronaries with a dose  of retrograde warm blood cardioplegia.  The crossclamp was removed.  The heart resumed a spontaneous rhythm.  The vein grafts were de-aired and opened.  Each had good flow.  Hemostasis was documented at the proximal and distal anastomoses.  The patient was rewarmed and reperfused. Temporary pacing wires were applied.  The  lungs were expanded and the ventilator was resumed.  The patient was weaned off cardiopulmonary bypass, not requiring any inotropic support.  Echo showed preserved LV function.   Hemodynamics were normal.  Protamine was administered without adverse reaction.  The cannulas were removed.  The mediastinum was irrigated.  Protamine resulted in adequate hemostasis.  The superior pericardial fat was closed over the aorta.  Anterior mediastinal and left pleural chest tubes were placed and brought out through separate incisions.  The sternum  was closed with wire.  The patient remained stable.  The pectoralis fascia was closed with a running Vicryl.  The subcutaneous and skin layers were closed with running Vicryl and sterile dressings were applied.  Total cardiopulmonary bypass time was 109  minutes.   SHW D: 03/19/2021 4:24:26 pm T: 03/20/2021 1:15:00 am  JOB: [id number]/ [id number]

## 2021-03-20 NOTE — Progress Notes (Signed)
Cardiology Progress Note  Patient ID: MAREON ROBINETTE MRN: [id number] DOB: 04-01-1965 Date of Encounter: 03/20/2021  Primary Cardiologist: None  Subjective   Chief Complaint: SOB  HPI: Recovering well.  Chest x-ray really unremarkable.  Still reports shortness of breath with activity.  Recovering well status post CABG.  ROS:  All other ROS reviewed and negative. Pertinent positives noted in the HPI.     Inpatient Medications  Scheduled Meds:  acetaminophen  1,000 mg Oral Q6H   Or   acetaminophen (TYLENOL) oral liquid 160 mg/5 mL  1,000 mg Per Tube Q6H   allopurinol  100 mg Oral Daily   aspirin EC  325 mg Oral Daily   Or   aspirin  324 mg Per Tube Daily   bisacodyl  10 mg Oral Daily   Or   bisacodyl  10 mg Rectal Daily   Chlorhexidine Gluconate Cloth  6 each Topical Daily   docusate sodium  200 mg Oral Daily   enoxaparin (LOVENOX) injection  40 mg Subcutaneous QHS   fluticasone furoate-vilanterol  1 puff Inhalation Daily   furosemide  40 mg Oral Daily   gabapentin  200 mg Oral BID   guaiFENesin  600 mg Oral BID   insulin aspart  0-24 Units Subcutaneous TID AC & HS   levalbuterol  1.25 mg Nebulization TID   lidocaine  1 patch Transdermal Q24H   lisinopril  10 mg Oral Daily   metoprolol tartrate  25 mg Oral BID   pantoprazole  40 mg Oral Daily   rosuvastatin  40 mg Oral Daily   sodium chloride flush  3 mL Intravenous Q12H   Continuous Infusions:  PRN Meds: alum & mag hydroxide-simeth, levalbuterol, magnesium hydroxide, metoprolol tartrate, naphazoline-glycerin, ondansetron (ZOFRAN) IV, sodium chloride, sodium chloride flush, traMADol   Vital Signs   Vitals:   03/20/21 0500 03/20/21 0600 03/20/21 0715 03/20/21 0735  BP: 118/78 (!) 157/70  126/70  Pulse: 84 78 77 83  Resp:   14 17  Temp:    98.7 F (37.1 C)  TempSrc:    Oral  SpO2:    97%  Weight: 96.8 kg     Height:        Intake/Output Summary (Last 24 hours) at 03/20/2021 0746 Last data filed at 03/20/2021  0700 Gross per 24 hour  Intake 850 ml  Output 1500 ml  Net -650 ml   Last 3 Weights 03/20/2021 03/19/2021 03/18/2021  Weight (lbs) 213 lb 6.5 oz 214 lb 8.1 oz 214 lb 11.7 oz  Weight (kg) 96.8 kg 97.3 kg 97.4 kg      Telemetry  Overnight telemetry shows sinus rhythm in the 80s, which I personally reviewed.   Physical Exam   Vitals:   03/20/21 0500 03/20/21 0600 03/20/21 0715 03/20/21 0735  BP: 118/78 (!) 157/70  126/70  Pulse: 84 78 77 83  Resp:   14 17  Temp:    98.7 F (37.1 C)  TempSrc:    Oral  SpO2:    97%  Weight: 96.8 kg     Height:        Intake/Output Summary (Last 24 hours) at 03/20/2021 0746 Last data filed at 03/20/2021 0700 Gross per 24 hour  Intake 850 ml  Output 1500 ml  Net -650 ml    Last 3 Weights 03/20/2021 03/19/2021 03/18/2021  Weight (lbs) 213 lb 6.5 oz 214 lb 8.1 oz 214 lb 11.7 oz  Weight (kg) 96.8 kg 97.3 kg 97.4 kg  Body mass index is 31.51 kg/m.  General: Well nourished, well developed, in no acute distress Head: Atraumatic, normal size  Eyes: PEERLA, EOMI  Neck: Supple, no JVD Endocrine: No thryomegaly Cardiac: Normal S1, S2; RRR; no murmurs, rubs, or gallops Lungs: Diminished breath sounds bilaterally Abd: Soft, nontender, no hepatomegaly  Ext: No edema, pulses 2+ Musculoskeletal: No deformities, BUE and BLE strength normal and equal Skin: Warm and dry, no rashes   Neuro: Alert and oriented to person, place, time, and situation, CNII-XII grossly intact, no focal deficits  Psych: Normal mood and affect   Labs  High Sensitivity Troponin:   Recent Labs  Lab 03/15/21 1715 03/15/21 1853  TROPONINIHS 45* 48*     Cardiac EnzymesNo results for input(s): TROPONINI in the last 168 hours. No results for input(s): TROPIPOC in the last 168 hours.  Chemistry Recent Labs  Lab 03/15/21 1715 03/16/21 0631 03/18/21 1737 03/19/21 0338 03/20/21 0100  NA 137   < > 137 136 134*  K 3.6   < > 3.5 3.9 4.0  CL 100   < > 105 107 101  CO2 28   < > 25  24 26   GLUCOSE 103*   < > 132* 108* 109*  BUN 12   < > 12 16 19   CREATININE 1.07   < > 1.02 1.08 1.07  CALCIUM 9.4   < > 8.2* 7.9* 8.1*  PROT 7.6  --   --   --   --   ALBUMIN 4.4  --   --   --   --   AST 28  --   --   --   --   ALT 41  --   --   --   --   ALKPHOS 54  --   --   --   --   BILITOT 0.3  --   --   --   --   GFRNONAA >60   < > >60 >60 >60  ANIONGAP 9   < > 7 5 7    < > = values in this interval not displayed.    Hematology Recent Labs  Lab 03/18/21 1737 03/19/21 0338 03/20/21 0100  WBC 9.9 11.3* 11.7*  RBC 3.71* 3.43* 3.74*  HGB 11.6* 10.7* 11.7*  HCT 33.2* 31.5* 33.9*  MCV 89.5 91.8 90.6  MCH 31.3 31.2 31.3  MCHC 34.9 34.0 34.5  RDW 13.2 13.3 13.2  PLT 140* 132* 157   BNPNo results for input(s): BNP, PROBNP in the last 168 hours.  DDimer No results for input(s): DDIMER in the last 168 hours.   Radiology  DG Chest Port 1 View  Result Date: 03/19/2021 CLINICAL DATA:  Status post cardiac surgery. EXAM: PORTABLE CHEST 1 VIEW COMPARISON:  March 18, 2021. FINDINGS: Stable cardiomediastinal silhouette. Status post coronary bypass graft. Mediastinal drain is noted. Left-sided chest tube is noted without pneumothorax. Minimal bibasilar subsegmental atelectasis is noted. Bony thorax is unremarkable. IMPRESSION: Left-sided chest tube is noted without pneumothorax. Minimal bibasilar subsegmental atelectasis. Electronically Signed   By: Marijo Conception M.D.   On: 03/19/2021 07:44    Cardiac Studies  TTE 03/16/2021  1. Left ventricular ejection fraction, by estimation, is 55 to 60%. The  left ventricle has normal function. The left ventricle demonstrates  regional wall motion abnormalities (see scoring diagram/findings for  description). There is moderate concentric  left ventricular hypertrophy. Left ventricular diastolic parameters were  normal.   2. Right ventricular systolic function is normal. The  right ventricular  size is normal. Tricuspid regurgitation signal is  inadequate for assessing  PA pressure.   3. The mitral valve is grossly normal. Mild mitral valve regurgitation.  No evidence of mitral stenosis.   4. The aortic valve is tricuspid. Aortic valve regurgitation is not  visualized. No aortic stenosis is present.   5. The inferior vena cava is normal in size with greater than 50%  respiratory variability, suggesting right atrial pressure of 3 mmHg.   Patient Profile  BRIXON ZHEN is a 56 y.o. male with tobacco abuse, psoriatic arthritis, hypertension, hyperlipidemia who is admitted on 03/15/2021 for non-STEMI.  Underwent CABG on 03/17/2021.  Assessment & Plan   #Non-STEMI -Found to have severe CAD.  Status post CABG. -On aspirin.  We will transition to 81 mg aspirin and add Plavix 75 mg daily once surgery is okay with this. -Euvolemic on exam.  Denies any chest pain symptoms. -Continue metoprolol to tartrate 25 mg twice daily. -He is on high intensity statin. -Ejection fraction is normal.  Overall recovering well from surgery.  Chest tube is out.  Still has pacing wires.  Plans for transition to floor today per surgery.  #HTN -Continue metoprolol and lisinopril.  Blood pressures are stable.  For questions or updates, please contact [facility] Please consult [email or web address] for contact info under     Signed, [place] T. Audie Box, MD, [place]  03/20/2021 7:46 AM

## 2021-03-21 ENCOUNTER — Other Ambulatory Visit: Payer: Self-pay | Admitting: Physician Assistant

## 2021-03-21 LAB — BASIC METABOLIC PANEL
Anion gap: 8 (ref 5–15)
BUN: 15 mg/dL (ref 6–20)
CO2: 23 mmol/L (ref 22–32)
Calcium: 8.1 mg/dL — ABNORMAL LOW (ref 8.9–10.3)
Chloride: 103 mmol/L (ref 98–111)
Creatinine, Ser: 0.96 mg/dL (ref 0.61–1.24)
GFR, Estimated: >60 mL/min (ref >60)
Glucose, Bld: 96 mg/dL (ref 70–99)
Potassium: 3.8 mmol/L (ref 3.5–5.1)
Sodium: 134 mmol/L — ABNORMAL LOW (ref 135–145)

## 2021-03-21 LAB — CBC
HCT: 30.8 % — ABNORMAL LOW (ref 39.0–52.0)
Hemoglobin: 10.3 g/dL — ABNORMAL LOW (ref 13.0–17.0)
MCH: 30.7 pg (ref 26.0–34.0)
MCHC: 33.4 g/dL (ref 30.0–36.0)
MCV: 91.7 fL (ref 80.0–100.0)
Platelets: 177 10*3/uL (ref 150–400)
RBC: 3.36 MIL/uL — ABNORMAL LOW (ref 4.22–5.81)
RDW: 13.4 % (ref 11.5–15.5)
WBC: 9.6 10*3/uL (ref 4.0–10.5)
nRBC: 0 % (ref 0.0–0.2)

## 2021-03-21 MED ORDER — ROSUVASTATIN CALCIUM 40 MG PO TABS: 40.0000 mg | ORAL_TABLET | Freq: Every day | ORAL | 3 refills | Status: DC

## 2021-03-21 MED ORDER — GABAPENTIN 100 MG PO CAPS: 200.0000 mg | ORAL_CAPSULE | Freq: Two times a day (BID) | ORAL | 3 refills | Status: DC

## 2021-03-21 MED ORDER — HYDROCODONE-ACETAMINOPHEN 5-325 MG PO TABS: 1.0000 | ORAL_TABLET | ORAL | 0 refills | Status: DC | PRN

## 2021-03-21 MED ORDER — CLOPIDOGREL BISULFATE 75 MG PO TABS: 75.0000 mg | ORAL_TABLET | Freq: Every day | ORAL | 3 refills | Status: DC

## 2021-03-21 MED ORDER — ASPIRIN 325 MG PO TBEC: 325.0000 mg | DELAYED_RELEASE_TABLET | Freq: Every day | ORAL | 0 refills | Status: DC

## 2021-03-21 MED ORDER — TRAMADOL HCL 50 MG PO TABS: 50.0000 mg | ORAL_TABLET | Freq: Four times a day (QID) | ORAL | 0 refills | Status: DC | PRN

## 2021-03-21 MED ORDER — METOPROLOL TARTRATE 25 MG PO TABS: 25.0000 mg | ORAL_TABLET | Freq: Two times a day (BID) | ORAL | 3 refills | Status: DC

## 2021-03-21 NOTE — Progress Notes (Signed)
Patient was unable to pick up Norco from [facility] on [address] as they are out of the medication.   They requested Tramadol be sent in place.  Ellwood Handler, PA-C

## 2021-03-21 NOTE — Progress Notes (Signed)
Order received to discharge patient.  Telemetry monitor removed and CCMD notified.  PIV access removed.  Discharge instructions, follow up, medications and instructions for their use discussed with patient. 

## 2021-03-21 NOTE — Progress Notes (Signed)
° °   °  [address]       [place] [address]             [phone]      4 Days Post-Op Procedure(s) (LRB): CORONARY ARTERY BYPASS GRAFTING (CABG) X THREE USING RIGHT GREATER SAPHENOUS VEIN HARVESTED ENDOSCOPICALLY AND LEFT INTERNAL MAMMARY ARTERY. (N/A) TRANSESOPHAGEAL ECHOCARDIOGRAM (TEE) (N/A) APPLICATION OF CELL SAVER  Subjective:  Up eating breakfast.  He has no complaints, other than not sleeping well.  He has moved his bowels.  He is ready to go home.  Objective: Vital signs in last 24 hours: Temp:  [98.1 F (36.7 C)-98.6 F (37 C)] 98.1 F (36.7 C) (02/19 0439) Pulse Rate:  [69-78] 74 (02/19 0439) Cardiac Rhythm: Normal sinus rhythm (02/19 0700) Resp:  [14-20] 15 (02/19 0439) BP: (122-141)/(68-78) 122/71 (02/19 0439) SpO2:  [90 %-98 %] 91 % (02/19 0439) Weight:  [95.3 kg] 95.3 kg (02/19 0439)  Intake/Output from previous day: 02/18 0701 - 02/19 0700 In: 243 [P.O.:240; I.V.:3] Out: -   General appearance: alert, cooperative, and no distress Heart: regular rate and rhythm Lungs: clear to auscultation bilaterally Abdomen: soft, non-tender; bowel sounds normal; no masses,  no organomegaly Extremities: edema trace Wound: clean and dry  Lab Results: Recent Labs    03/20/21 0100 03/21/21 0113  WBC 11.7* 9.6  HGB 11.7* 10.3*  HCT 33.9* 30.8*  PLT 157 177   BMET:  Recent Labs    03/20/21 0100 03/21/21 0113  NA 134* 134*  K 4.0 3.8  CL 101 103  CO2 26 23  GLUCOSE 109* 96  BUN 19 15  CREATININE 1.07 0.96  CALCIUM 8.1* 8.1*    PT/INR: No results for input(s): LABPROT, INR in the last 72 hours. ABG    Component Value Date/Time   PHART 7.446 03/17/2021 2244   HCO3 22.5 03/17/2021 2244   TCO2 23 03/17/2021 2244   ACIDBASEDEF 1.0 03/17/2021 2244   O2SAT 87.8 03/18/2021 0518   CBG (last 3)  Recent Labs    03/19/21 1643 03/19/21 2023 03/20/21 0641  GLUCAP 115* 112* 99    Assessment/Plan: S/P Procedure(s) (LRB): CORONARY  ARTERY BYPASS GRAFTING (CABG) X THREE USING RIGHT GREATER SAPHENOUS VEIN HARVESTED ENDOSCOPICALLY AND LEFT INTERNAL MAMMARY ARTERY. (N/A) TRANSESOPHAGEAL ECHOCARDIOGRAM (TEE) (N/A) APPLICATION OF CELL SAVER  CV-NSR, BP is elevating- continue Lopressor, will resume HCTZ Pulm- no acute issues, off oxygen Renal- creatinine stable, weight is trending down Expected post operative anemia, mild Hgb at 10.3 Dispo- patient stable, will d/c home today   LOS: 6 days    Ellwood Handler, PA-C 03/21/2021

## 2021-03-21 NOTE — Progress Notes (Signed)
Chest tube sutures removed per MD order without difficulty. 

## 2021-03-22 ENCOUNTER — Telehealth: Payer: Self-pay | Admitting: Cardiothoracic Surgery

## 2021-03-22 NOTE — Telephone Encounter (Addendum)
I called Brian Brewer at home to check on his transition from the hospital, to review his meds and to answer questions that they had. 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

## 2021-03-23 ENCOUNTER — Telehealth: Payer: Self-pay

## 2021-03-23 NOTE — Telephone Encounter (Signed)
FMLA form completed and faxed to [phone] per pt's request.  Beginning LOA 03/15/21 through 06/14/21.

## 2021-03-26 ENCOUNTER — Other Ambulatory Visit: Payer: Self-pay | Admitting: Cardiothoracic Surgery

## 2021-03-26 DIAGNOSIS — Z951 Presence of aortocoronary bypass graft: Secondary | ICD-10-CM

## 2021-03-29 ENCOUNTER — Ambulatory Visit (INDEPENDENT_AMBULATORY_CARE_PROVIDER_SITE_OTHER): Payer: Self-pay | Admitting: Cardiothoracic Surgery

## 2021-03-29 ENCOUNTER — Other Ambulatory Visit: Payer: Self-pay

## 2021-03-29 ENCOUNTER — Ambulatory Visit
Admission: RE | Admit: 2021-03-29 | Discharge: 2021-03-29 | Disposition: A | Discharge status: Home / Self Care | Payer: Commercial Managed Care - PPO | Source: Ambulatory Visit | Attending: Cardiothoracic Surgery | Admitting: Cardiothoracic Surgery

## 2021-03-29 ENCOUNTER — Encounter: Payer: Self-pay | Admitting: Cardiothoracic Surgery

## 2021-03-29 VITALS — BP 120/73 | HR 66 | Resp 20 | Ht 69.0 in | Wt 202.8 lb

## 2021-03-29 DIAGNOSIS — Z951 Presence of aortocoronary bypass graft: Secondary | ICD-10-CM

## 2021-03-29 MED ORDER — TRAMADOL HCL 50 MG PO TABS: 50.0000 mg | ORAL_TABLET | Freq: Four times a day (QID) | ORAL | 0 refills | Status: DC | PRN

## 2021-03-29 NOTE — Progress Notes (Signed)
Patient ID: Brian Brewer, male   DOB: [birth year], 56 y.o.   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  He had CABG x3 on February 15.  He is doing well with improved energy, no angina, and sternal incision healing well so far.  He is still using tramadol as needed for musculoskeletal pain.  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  Vitals:   03/29/21 1114  BP: 120/73  Pulse: 66  Resp: 20  SpO2: 96%   Exam Patient is alert and comfortable with normal gait Lungs clear Sternal incision well-healed and stable Cardiac rhythm regular without murmur or gallop Leg incision healed well edema  Chest x-ray image performed today personally reviewed showing clear lung fields sternal wires intact normal cardiac silhouette  Plan-the patient can resume driving short distances for his work.  He cannot lift more than 10 pounds and cannot climb full squat or push. I have given him a return to work note with limitations. He will return in 6 to 8 weeks for final postoperative check. He will continue his current medications and follow a heart healthy diet.  Importance of smoking cessation was again emphasized to the patient.

## 2021-04-11 NOTE — Progress Notes (Signed)
Office Visit    Patient Name: Brian Brewer Date of Encounter: 04/12/2021  Primary Care Provider:  Kathyrn Drown, MD Primary Cardiologist:  Shelva Majestic, MD  Chief Complaint    56 year old male with a history of CAD s/p CABG x3 in 03/2021, carotid artery stenosis, hypertension, hyperlipidemia, gout, psoriatic arthritis, former tobacco use and GERD who presents for follow-up related to CAD s/p CABG x3.   Past Medical History    Past Medical History:  Diagnosis Date   Cancer ([place])    melanoma on the back was removed 10/2018   GERD (gastroesophageal reflux disease)    Gout    Hyperlipemia    Hypertension    Psoriatic arthritis ([place]) 12/11/2013   Dr Amil Amen    Past Surgical History:  Procedure Laterality Date   BACK SURGERY  2003   L4,5,S1 Fusion   CHOLECYSTECTOMY     COLONOSCOPY N/A 12/29/2016   Procedure: COLONOSCOPY;  Surgeon: Daneil Dolin, MD;  [facility];  Service: Endoscopy;  Laterality: N/A;  9:15 AM   CORONARY ARTERY BYPASS GRAFT N/A 03/17/2021   Procedure: CORONARY ARTERY BYPASS GRAFTING (CABG) X THREE USING RIGHT GREATER SAPHENOUS VEIN HARVESTED ENDOSCOPICALLY AND LEFT INTERNAL MAMMARY ARTERY.;  Surgeon: Dahlia Byes, MD;  Location: [place];  Service: Open Heart Surgery;  Laterality: N/A;   LEFT HEART CATH AND CORONARY ANGIOGRAPHY N/A 03/16/2021   Procedure: LEFT HEART CATH AND CORONARY ANGIOGRAPHY;  Surgeon: Belva Crome, MD;  Location: [place] [facility];  Service: Cardiovascular;  Laterality: N/A;   LUMBAR FUSION     3 yrs ago-[place]   TEE WITHOUT CARDIOVERSION N/A 03/17/2021   Procedure: TRANSESOPHAGEAL ECHOCARDIOGRAM (TEE);  Surgeon: Dahlia Byes, MD;  Location: [place];  Service: Open Heart Surgery;  Laterality: N/A;   TOTAL HIP ARTHROPLASTY Right 02/15/2019   Procedure: RIGHT TOTAL HIP ARTHROPLASTY ANTERIOR APPROACH;  Surgeon: Mcarthur Rossetti, MD;  Location: [facility];  Service: Orthopedics;  Laterality: Right;     Allergies  Allergies  Allergen Reactions   Amoxicillin-Pot Clavulanate Other (See Comments)    Cramps and diarrhea Has patient had a PCN reaction causing immediate rash, facial/tongue/throat swelling, SOB or lightheadedness with hypotension: No Has patient had a PCN reaction causing severe rash involving mucus membranes or skin necrosis: No Has patient had a PCN reaction that required hospitalization: No Has patient had a PCN reaction occurring within the last 10 years: No  If all of the above answers are "NO", then may proceed with Cephalosporin use.   Levofloxacin Other (See Comments)    Passed out (possibly due to medication)   Zithromax [Azithromycin] Diarrhea   Chantix [Varenicline Tartrate] Nausea And Vomiting   Oxycodone Diarrhea, Itching and Anxiety    History of Present Illness    56 year old male with the above past medical history including CAD s/p CABG x3 in 03/2021, carotid artery stenosis, hypertension, hyperlipidemia, gout, psoriatic arthritis, former tobacco use, and GERD.  He was evaluated by Dr. Harrington Challenger in 2014 in the setting chest pain. Stress echo at the time was normal.  He has not been seen in the office since.  He saw his PCP on 03/15/2021 and complained of burning chest pain. EKG showed changes concerning for ST elevation.  He was advised to go to the [facility], ED for further evaluation.  Troponin was elevated, EKG showed new T wave inversion in aVL and ST elevation in leads II and III.  He was started on IV heparin  and transferred to [facility] for further evaluation.   He was hospitalized from 03/15/2021 to 03/21/2021 in the setting of non-STEMI. Cardiac catheterization on 03/16/2021 showed multivessel disease (o-p LAD 95%, p-m LAD 100%, D1 60%, p-m Cx 50%, and pRCA 65% stenose), EF 50-55%.  CT surgery was consulted for sideration of CABG.  Echocardiogram showed EF, 55 to 60%, normal LV function, moderate concentric LVH, mild mitral valve regurgitation.  Pre-CABG  Dopplers showed 1 to 39% B ICA stenosis.  He underwent CABG x3 (LIMA-LAD, SVG-PDA, SVG-Diagonal) on 03/17/2021.  His postop course was uncomplicated.  He was discharged home in stable condition on 03/21/2021.  CT surgery for a follow-up outpatient visit on 03/29/2021.  He was cleared to drive short distances for his work he was advised not to lift more than 10 pounds no climbing, full squat or pushing.  He presents today for follow-up accompanied by his wife.  Since his surgery he has done well from a cardiac standpoint.  He is back to work and is tolerating this well. He does not have symptoms concerning for angina. His biggest concern is that since his surgery he has had dry itchy, flaky skin. Of note he does have a history of psoriatic arthritis, for which he takes Rinvoq, however, he has not taken this sine the time of surgery. He is wondering if his symptoms are reaction to a new medication, or if they are occurring in the setting of having stopped his Rinvoq. Otherwise, he reports feeling well, and denies any specific concerns or complaints today.  Home Medications    Current Outpatient Medications  Medication Sig Dispense Refill   allopurinol (ZYLOPRIM) 100 MG tablet TAKE ONE TABLET BY MOUTH ONCE DAILY. (Patient taking differently: Take 100 mg by mouth daily. TAKE ONE TABLET BY MOUTH ONCE DAILY.) 90 tablet 1   aspirin EC 325 MG EC tablet Take 1 tablet (325 mg total) by mouth daily. 30 tablet 0   clopidogrel (PLAVIX) 75 MG tablet Take 1 tablet (75 mg total) by mouth daily. 30 tablet 3   esomeprazole (NEXIUM) 40 MG capsule Take 1 capsule (40 mg total) by mouth daily. 90 capsule 1   gabapentin (NEURONTIN) 100 MG capsule Take 2 capsules (200 mg total) by mouth 2 (two) times daily. 60 capsule 3   lisinopril-hydrochlorothiazide (ZESTORETIC) 10-12.5 MG tablet Take 1 tablet by mouth daily. 90 tablet 1   metoprolol tartrate (LOPRESSOR) 25 MG tablet Take 1 tablet (25 mg total) by mouth 2 (two) times daily.  60 tablet 3   RINVOQ 15 MG TB24 Take 1 tablet by mouth daily.     rosuvastatin (CRESTOR) 40 MG tablet Take 1 tablet (40 mg total) by mouth daily. 30 tablet 3   traMADol (ULTRAM) 50 MG tablet Take 1 tablet (50 mg total) by mouth every 6 (six) hours as needed. 30 tablet 0   No current facility-administered medications for this visit.     Review of Systems    He denies chest pain, palpitations, dyspnea, pnd, orthopnea, n, v, dizziness, syncope, edema, weight gain, or early satiety. All other systems reviewed and are otherwise negative except as noted above.   Physical Exam    VS:  BP 128/80 (BP Location: Right Arm, Patient Position: Sitting, Cuff Size: Normal)    Pulse 67    Ht '5\' 9"'$  (1.753 m)    Wt 208 lb (94.3 kg)    BMI 30.72 kg/m   GEN: Well nourished, well developed, in no acute distress. HEENT: normal.  Neck: Supple, no JVD, carotid bruits, or masses. Cardiac: RRR, no murmurs, rubs, or gallops. No clubbing, cyanosis, edema.  Radials/DP/PT 2+ and equal bilaterally.  Sternotomy incision clean, dry, intact, and well approximated. Respiratory:  Respirations regular and unlabored, clear to auscultation bilaterally. GI: Soft, nontender, nondistended, BS + x 4. MS: no deformity or atrophy. Skin: warm and dry, no rash. Neuro:  Strength and sensation are intact. Psych: Normal affect.  Accessory Clinical Findings    ECG personally reviewed by me today - NSR, 67 bpm, nonspecific ST/T wave chagnes - no acute changes.  Lab Results  Component Value Date   WBC 9.6 03/21/2021   HGB 10.3 (L) 03/21/2021   HCT 30.8 (L) 03/21/2021   MCV 91.7 03/21/2021   PLT 177 03/21/2021   Lab Results  Component Value Date   CREATININE 0.96 03/21/2021   BUN 15 03/21/2021   NA 134 (L) 03/21/2021   K 3.8 03/21/2021   CL 103 03/21/2021   CO2 23 03/21/2021   Lab Results  Component Value Date   ALT 41 03/15/2021   AST 28 03/15/2021   ALKPHOS 54 03/15/2021   BILITOT 0.3 03/15/2021   Lab Results   Component Value Date   CHOL 122 03/17/2021   HDL 31 (L) 03/17/2021   LDLCALC 54 03/17/2021   TRIG 186 (H) 03/17/2021   CHOLHDL 3.9 03/17/2021    Lab Results  Component Value Date   HGBA1C 5.7 (H) 03/16/2021    Assessment & Plan    1. CAD: S/p CABG x3 (LIMA-LAD, SVG-PDA, SVG-Diagonal) on 03/17/2021. Stable with no anginal symptoms.  He did have mild post-op anemia, will f/u CBC today.    2. Carotid artery stenosis: Pre-CABG Dopplers showed 1-39% B ICA stenosis. Asymptomatic. Consider repeat study if he develops symptoms in future.  3. Hypertension: BP well controlled. Continue current antihypertensive regimen.   4. Hyperlipidemia: LDL was 54 in 03/2021.  Crestor was increased to 40 mg daily post CABG.  We will repeat lipids, LFTs in early April.   5. Psoriatic arthritis: He has a history of psoriatic arthritis and started taking Rinvoq 2 months prior to his CABG.  Since the time of his surgery he has not taken his medication. He has noticed dry, itchy, flaky skin since this time. He was concerned that it could be a reaction to a new medication.  Discussed with clinical pharmacist states she is unaware of any potential reaction with current medication regimen.  This could be a reaction to having stopped the Rinvoq.  He plans to restart Rinvoq and will monitor for symptoms. If symptoms persist, recommend follow-up with PCP versus dermatology.  6. Former tobacco use: He has quit smoking since the time of his surgery. I congratulated him on this.    7. Disposition: F/u in 3 months.  Lenna Sciara, NP 04/12/2021, 10:56 AM

## 2021-04-12 ENCOUNTER — Other Ambulatory Visit: Payer: Self-pay

## 2021-04-12 ENCOUNTER — Encounter: Payer: Self-pay | Admitting: Nurse Practitioner

## 2021-04-12 ENCOUNTER — Ambulatory Visit (INDEPENDENT_AMBULATORY_CARE_PROVIDER_SITE_OTHER): Payer: Commercial Managed Care - PPO | Admitting: Nurse Practitioner

## 2021-04-12 VITALS — BP 128/80 | HR 67 | Ht 69.0 in | Wt 208.0 lb

## 2021-04-12 DIAGNOSIS — I10 Essential (primary) hypertension: Secondary | ICD-10-CM

## 2021-04-12 DIAGNOSIS — L4050 Arthropathic psoriasis, unspecified: Secondary | ICD-10-CM

## 2021-04-12 DIAGNOSIS — I2510 Atherosclerotic heart disease of native coronary artery without angina pectoris: Secondary | ICD-10-CM | POA: Diagnosis not present

## 2021-04-12 DIAGNOSIS — Z951 Presence of aortocoronary bypass graft: Secondary | ICD-10-CM

## 2021-04-12 DIAGNOSIS — Z87891 Personal history of nicotine dependence: Secondary | ICD-10-CM

## 2021-04-12 DIAGNOSIS — E785 Hyperlipidemia, unspecified: Secondary | ICD-10-CM

## 2021-04-12 DIAGNOSIS — I6523 Occlusion and stenosis of bilateral carotid arteries: Secondary | ICD-10-CM | POA: Diagnosis not present

## 2021-04-12 LAB — CBC
Hematocrit: 37.6 % (ref 37.5–51.0)
Hemoglobin: 12.7 g/dL — ABNORMAL LOW (ref 13.0–17.7)
MCH: 29.5 pg (ref 26.6–33.0)
MCHC: 33.8 g/dL (ref 31.5–35.7)
MCV: 87 fL (ref 79–97)
Platelets: 321 10*3/uL (ref 150–450)
RBC: 4.31 x10E6/uL (ref 4.14–5.80)
RDW: 13.2 % (ref 11.6–15.4)
WBC: 11.1 10*3/uL — ABNORMAL HIGH (ref 3.4–10.8)

## 2021-04-12 NOTE — Patient Instructions (Addendum)
Medication Instructions:  ?Your physician recommends that you continue on your current medications as directed. Please refer to the Current Medication list given to you today.  ? ?*If you need a refill on your cardiac medications before your next appointment, please call your pharmacy* ? ? ?Lab Work: ?Your physician recommends that you complete labs today and come back for fasting lab work at the beginning of April 2023 ?CBC (today) ?Fasting Lipids & LFT (April 2023) ? ?If you have labs (blood work) drawn today and your tests are completely normal, you will receive your results only by: ?MyChart Message (if you have MyChart) OR ?A paper copy in the mail ?If you have any lab test that is abnormal or we need to change your treatment, we will call you to review the results. ? ? ?Testing/Procedures: ?NONE ordered at this time of appointment  ? ? ? ?Follow-Up: ?At [facility], you and your health needs are our priority.  As part of our continuing mission to provide you with exceptional heart care, we have created designated Provider Care Teams.  These Care Teams include your primary Cardiologist (physician) and Advanced Practice Providers (APPs -  Physician Assistants and Nurse Practitioners) who all work together to provide you with the care you need, when you need it. ? ?We recommend signing up for the patient portal called "MyChart".  Sign up information is provided on this After Visit Summary.  MyChart is used to connect with patients for Virtual Visits (Telemedicine).  Patients are able to view lab/test results, encounter notes, upcoming appointments, etc.  Non-urgent messages can be sent to your provider as well.   ?To learn more about what you can do with MyChart, go to [email or web address].   ? ?Your next appointment:   ?3 month(s) ? ?The format for your next appointment:   ?In Person ? ?Provider:   ?Diona Browner, NP      ? ? ? ? ? ?

## 2021-04-14 ENCOUNTER — Telehealth: Payer: Self-pay

## 2021-04-14 NOTE — Telephone Encounter (Addendum)
Spoke with pt. Pt was notified of results. ----- Message from Lenna Sciara, NP sent at 04/13/2021  1:17 PM EDT ----- ?Recent labs show hemoglobin has improved since surgery, most returned to baseline.  Continue current medications and follow-up as planned.  Thank you. ?

## 2021-04-19 ENCOUNTER — Ambulatory Visit: Payer: Commercial Managed Care - PPO | Admitting: Cardiothoracic Surgery

## 2021-04-23 ENCOUNTER — Ambulatory Visit (INDEPENDENT_AMBULATORY_CARE_PROVIDER_SITE_OTHER): Payer: Commercial Managed Care - PPO | Admitting: Family Medicine

## 2021-04-23 ENCOUNTER — Other Ambulatory Visit: Payer: Self-pay

## 2021-04-23 VITALS — BP 159/92 | HR 83 | Temp 98.3°F | Ht 69.0 in | Wt 205.6 lb

## 2021-04-23 DIAGNOSIS — J029 Acute pharyngitis, unspecified: Secondary | ICD-10-CM | POA: Diagnosis not present

## 2021-04-23 DIAGNOSIS — J989 Respiratory disorder, unspecified: Secondary | ICD-10-CM

## 2021-04-23 LAB — POCT RAPID STREP A (OFFICE): Rapid Strep A Screen: NEGATIVE

## 2021-04-23 MED ORDER — CETIRIZINE HCL 10 MG PO TABS: 10.0000 mg | ORAL_TABLET | Freq: Every day | ORAL | 0 refills | Status: DC

## 2021-04-23 MED ORDER — IPRATROPIUM BROMIDE 0.06 % NA SOLN: 2.0000 | Freq: Four times a day (QID) | NASAL | 0 refills | Status: DC | PRN

## 2021-04-23 MED ORDER — PROMETHAZINE-DM 6.25-15 MG/5ML PO SYRP: 5.0000 mL | ORAL_SOLUTION | Freq: Four times a day (QID) | ORAL | 0 refills | Status: DC | PRN

## 2021-04-23 NOTE — Progress Notes (Signed)
? ?Subjective:  ?Patient ID: Brian Brewer, male    DOB: 1965/05/28  Age: 56 y.o. MRN: [id number] ? ?CC: ?Chief Complaint  ?Patient presents with  ? Sore Throat  ? Headache  ? Otitis Media  ? ? ?HPI: ? ?56 year old male presents for evaluation of respiratory symptoms. ? ?Patient reports that he has been not feeling well over the past 2 weeks.  He states that he had a recent telehealth visit and was prescribed doxycycline for sinusitis.  He seemed to be doing well and improving until yesterday when he developed sore throat and headache.  He has sinus pressure and congestion.  No fever.  He is still on antibiotic therapy.  Denies fatigue.  Denies shortness of breath.  He is otherwise feeling well.  No other complaints/concerns at this time. ? ?Patient Active Problem List  ? Diagnosis Date Noted  ? Respiratory illness 04/23/2021  ? Hx of CABG 03/29/2021  ? S/P CABG x 3 03/17/2021  ? NSTEMI (non-ST elevated myocardial infarction) ([place]) 03/15/2021  ? 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 Unilateral primary osteoarthritis, right hip 01/16/2019  ? Bronchitis due to tobacco use 10/27/2017  ? Psoriatic arthritis ([place]) 12/11/2013  ? Hyperuricemia 12/11/2013  ? HTN (hypertension), benign 10/22/2012  ? Fasting hyperglycemia 10/22/2012  ? Hyperlipemia 10/22/2012  ? Allergic rhinitis 05/21/2012  ? GERD 05/27/2009  ? ? ?Social Hx   ?Social History  ? ?Socioeconomic History  ? Marital status: Married  ?  Spouse name: Not on file  ? Number of children: Not on file  ? Years of education: Not on file  ? Highest education level: Not on file  ?Occupational History  ? Not on file  ?Tobacco Use  ? Smoking status: Former  ?  Packs/day: 0.50  ?  Years: 20.00  ?  Pack years: 10.00  ?  Types: Cigarettes  ?  Quit date: 03/15/2021  ?  Years since quitting: 0.1  ? Smokeless tobacco: Never  ?Vaping Use  ? Vaping Use: Never used  ?Substance and Sexual Activity  ? Alcohol use: No  ? Drug use: No  ? Sexual activity: Yes  ?  Birth  control/protection: None  ?Other Topics Concern  ? Not on file  ?Social History Narrative  ? Not on file  ? ?Social Determinants of Health  ? ?Financial Resource Strain: Not on file  ?Food Insecurity: Not on file  ?Transportation Needs: Not on file  ?Physical Activity: Not on file  ?Stress: Not on file  ?Social Connections: Not on file  ? ? ?Review of Systems ?Per HPI ? ?Objective:  ?BP (!) 159/92   Pulse 83   Temp 98.3 ?F (36.8 ?C) (Oral)   Ht '5\' 9"'$  (1.753 m)   Wt 205 lb 9.6 oz (93.3 kg)   SpO2 97%   BMI 30.36 kg/m?  ? ? ?  04/23/2021  ? 10:27 AM 04/12/2021  ? 10:07 AM 03/29/2021  ? 11:14 AM  ?BP/Weight  ?Systolic BP 468 032 122  ?Diastolic BP 92 80 73  ?Wt. (Lbs) 205.6 208 202.8  ?BMI 30.36 kg/m2 30.72 kg/m2 29.95 kg/m2  ? ? ?Physical Exam ?Vitals and nursing note reviewed.  ?Constitutional:   ?   General: He is not in acute distress. ?   Appearance: He is well-developed.  ?HENT:  ?   Head: Normocephalic and atraumatic.  ?   Right Ear: Tympanic membrane normal.  ?   Left Ear: Tympanic membrane normal.  ?  Mouth/Throat:  ?   Pharynx: Posterior oropharyngeal erythema present. No oropharyngeal exudate.  ?Cardiovascular:  ?   Rate and Rhythm: Normal rate and regular rhythm.  ?   Heart sounds: No murmur heard. ?Pulmonary:  ?   Effort: Pulmonary effort is normal.  ?   Breath sounds: Normal breath sounds. No wheezing, rhonchi or rales.  ?Neurological:  ?   Mental Status: He is alert.  ? ? ?Lab Results  ?Component Value Date  ? WBC 11.1 (H) 04/12/2021  ? HGB 12.7 (L) 04/12/2021  ? HCT 37.6 04/12/2021  ? PLT 321 04/12/2021  ? GLUCOSE 96 03/21/2021  ? CHOL 122 03/17/2021  ? TRIG 186 (H) 03/17/2021  ? HDL 31 (L) 03/17/2021  ? [place] 54 03/17/2021  ? ALT 41 03/15/2021  ? AST 28 03/15/2021  ? NA 134 (L) 03/21/2021  ? K 3.8 03/21/2021  ? CL 103 03/21/2021  ? CREATININE 0.96 03/21/2021  ? BUN 15 03/21/2021  ? CO2 23 03/21/2021  ? TSH 1.754 03/17/2021  ? INR 1.2 03/17/2021  ? HGBA1C 5.7 (H) 03/16/2021  ? ? ? ?Assessment &  Plan:  ? ?Problem List Items Addressed This Visit   ? ?  ? Respiratory  ? Respiratory illness - Primary  ?  Patient is currently on antibiotic therapy.  Rapid strep negative.  Advised to finish course of doxycycline.  Placing on Zyrtec and Atrovent nasal spray.  Promethazine DM for cough. ?  ?  ? ?Other Visit Diagnoses   ? ? Sore throat      ? Relevant Orders  ? POCT rapid strep A (Completed)  ? ?  ? ? ?Meds ordered this encounter  ?Medications  ? DISCONTD: promethazine-dextromethorphan (PROMETHAZINE-DM) 6.25-15 MG/5ML syrup  ?  Sig: Take 5 mLs by mouth 4 (four) times daily as needed.  ?  Dispense:  118 mL  ?  Refill:  0  ? DISCONTD: cetirizine (ZYRTEC ALLERGY) 10 MG tablet  ?  Sig: Take 1 tablet (10 mg total) by mouth daily.  ?  Dispense:  14 tablet  ?  Refill:  0  ? DISCONTD: ipratropium (ATROVENT) 0.06 % nasal spray  ?  Sig: Place 2 sprays into both nostrils 4 (four) times daily as needed for rhinitis.  ?  Dispense:  15 mL  ?  Refill:  0  ? cetirizine (ZYRTEC ALLERGY) 10 MG tablet  ?  Sig: Take 1 tablet (10 mg total) by mouth daily.  ?  Dispense:  14 tablet  ?  Refill:  0  ? ipratropium (ATROVENT) 0.06 % nasal spray  ?  Sig: Place 2 sprays into both nostrils 4 (four) times daily as needed for rhinitis.  ?  Dispense:  15 mL  ?  Refill:  0  ? promethazine-dextromethorphan (PROMETHAZINE-DM) 6.25-15 MG/5ML syrup  ?  Sig: Take 5 mLs by mouth 4 (four) times daily as needed.  ?  Dispense:  118 mL  ?  Refill:  0  ? ? ?Thersa Salt DO ?[place] ? ?

## 2021-04-23 NOTE — Patient Instructions (Signed)
Finish the doxycycline. ? ?Medications as prescribed. ? ?Call with symptoms persist. ? ?Take care ? ?Dr. Lacinda Axon  ?

## 2021-04-23 NOTE — Assessment & Plan Note (Signed)
Patient is currently on antibiotic therapy.  Rapid strep negative.  Advised to finish course of doxycycline.  Placing on Zyrtec and Atrovent nasal spray.  Promethazine DM for cough. ?

## 2021-05-04 LAB — BASIC METABOLIC PANEL
BUN/Creatinine Ratio: 10 (ref 9–20)
BUN: 10 mg/dL (ref 6–24)
CO2: 24 mmol/L (ref 20–29)
Calcium: 9.7 mg/dL (ref 8.7–10.2)
Chloride: 100 mmol/L (ref 96–106)
Creatinine, Ser: 1 mg/dL (ref 0.76–1.27)
Glucose: 109 mg/dL — ABNORMAL HIGH (ref 70–99)
Potassium: 4.7 mmol/L (ref 3.5–5.2)
Sodium: 135 mmol/L (ref 134–144)
eGFR: 89 mL/min/{1.73_m2} (ref >59)

## 2021-05-04 LAB — LIPID PANEL
Chol/HDL Ratio: 3.7 ratio (ref 0.0–5.0)
Cholesterol, Total: 97 mg/dL — ABNORMAL LOW (ref 100–199)
HDL: 26 mg/dL — ABNORMAL LOW (ref >39)
LDL Chol Calc (NIH): 41 mg/dL (ref 0–99)
Triglycerides: 183 mg/dL — ABNORMAL HIGH (ref 0–149)
VLDL Cholesterol Cal: 30 mg/dL (ref 5–40)

## 2021-05-05 ENCOUNTER — Telehealth: Payer: Self-pay

## 2021-05-05 NOTE — Telephone Encounter (Signed)
Spoke with pt. Pt was notified of results and recommendations of working on dietary modifications. Discussed heart healthy diet, avoid fried, fatty, highly processed foods and limit alcohol consumption.  Pt will follow up in June 2023 with Carmie End, NP.  ?

## 2021-05-17 ENCOUNTER — Encounter: Payer: Self-pay | Admitting: Cardiothoracic Surgery

## 2021-05-19 ENCOUNTER — Encounter: Payer: Self-pay | Admitting: Cardiothoracic Surgery

## 2021-05-19 ENCOUNTER — Ambulatory Visit (INDEPENDENT_AMBULATORY_CARE_PROVIDER_SITE_OTHER): Payer: Self-pay | Admitting: Cardiothoracic Surgery

## 2021-05-19 VITALS — BP 168/85 | HR 75 | Resp 20 | Ht 69.0 in | Wt 213.0 lb

## 2021-05-19 DIAGNOSIS — I2583 Coronary atherosclerosis due to lipid rich plaque: Secondary | ICD-10-CM

## 2021-05-19 DIAGNOSIS — Z951 Presence of aortocoronary bypass graft: Secondary | ICD-10-CM

## 2021-05-19 DIAGNOSIS — I2510 Atherosclerotic heart disease of native coronary artery without angina pectoris: Secondary | ICD-10-CM

## 2021-05-19 NOTE — Progress Notes (Signed)
?  HPI: ?Patient returns for routine postoperative follow-up having undergone coronary artery bypass grafting x3 on March 17, 2021.Brian Brewer ?The patient's early postoperative recovery while in the hospital was notable for maintaining sinus rhythm.  Easily weaned off oxygen.Brian Brewer ?Since hospital discharge the patient reports he has stopped smoking.  He denies recurrent angina.  Sternal incisional discomfort is improved.  He is working full-time for his [employer].  He is not lifting more than 10 pounds until May 15, 3 months after surgery. ?Patient has an appointment to be seen by cardiology within the next 2 weeks.. ? ? ?Current Outpatient Medications  ?Medication Sig Dispense Refill  ? allopurinol (ZYLOPRIM) 100 MG tablet TAKE ONE TABLET BY MOUTH ONCE DAILY. (Patient taking differently: Take 100 mg by mouth daily. TAKE ONE TABLET BY MOUTH ONCE DAILY.) 90 tablet 1  ? aspirin EC 325 MG EC tablet Take 1 tablet (325 mg total) by mouth daily. 30 tablet 0  ? cetirizine (ZYRTEC ALLERGY) 10 MG tablet Take 1 tablet (10 mg total) by mouth daily. 14 tablet 0  ? clopidogrel (PLAVIX) 75 MG tablet Take 1 tablet (75 mg total) by mouth daily. 30 tablet 3  ? esomeprazole (NEXIUM) 40 MG capsule Take 1 capsule (40 mg total) by mouth daily. 90 capsule 1  ? gabapentin (NEURONTIN) 100 MG capsule Take 2 capsules (200 mg total) by mouth 2 (two) times daily. 60 capsule 3  ? ipratropium (ATROVENT) 0.06 % nasal spray Place 2 sprays into both nostrils 4 (four) times daily as needed for rhinitis. 15 mL 0  ? lisinopril-hydrochlorothiazide (ZESTORETIC) 10-12.5 MG tablet Take 1 tablet by mouth daily. 90 tablet 1  ? metoprolol tartrate (LOPRESSOR) 25 MG tablet Take 1 tablet (25 mg total) by mouth 2 (two) times daily. 60 tablet 3  ? promethazine-dextromethorphan (PROMETHAZINE-DM) 6.25-15 MG/5ML syrup Take 5 mLs by mouth 4 (four) times daily as needed. 118 mL 0  ? RINVOQ 15 MG TB24 Take 1 tablet by mouth daily.    ? rosuvastatin (CRESTOR) 40 MG tablet  Take 1 tablet (40 mg total) by mouth daily. 30 tablet 3  ? ?No current facility-administered medications for this visit.  ? ? ?Physical Exam: ?Blood pressure (!) 168/85, pulse 75, resp. rate 20, height '5\' 9"'$  (1.753 m), weight 213 lb (96.6 kg), SpO2 98 %.  ? ?  ?   Exam ? ?  General- alert and comfortable.  Sternal incision well-healed and stable. ?   Neck- no JVD, no cervical adenopathy palpable, no carotid bruit ?  Lungs- clear without rales, wheezes ?  Cor- regular rate and rhythm, no murmur , gallop ?  Abdomen- soft, non-tender ?  Extremities - warm, non-tender, minimal edema ?  Neuro- oriented, appropriate, no focal weakness ? ? ?Diagnostic Tests: ?No chest x-ray today.  Last chest x-ray 6 weeks ago is clear. ? ?Impression: ?Patient doing very well 2 months postop urgent CABG x3.  No symptoms of angina or CHF.  He has stopped smoking.  Surgical incisions are healing well. ? ?Patient understands he should not lift more than 20 pounds until 3 months after surgery, May 15. ?He will be followed by cardiology return here as needed.  He understands importance of heart healthy diet and heart healthy lifestyle which we discussed in detail. ? ?Plan: ?Return as needed. ?Continue Crestor aspirin metoprolol Zestoretic indefinitely. ? ? ?Dahlia Byes, MD ?[facility] ?([phone] ? ?

## 2021-06-15 ENCOUNTER — Encounter: Payer: Self-pay | Admitting: *Deleted

## 2021-06-15 ENCOUNTER — Telehealth: Payer: Self-pay

## 2021-06-15 MED ORDER — AMLODIPINE BESYLATE 5 MG PO TABS: 5.0000 mg | ORAL_TABLET | Freq: Every day | ORAL | 3 refills | Status: DC

## 2021-06-15 NOTE — Telephone Encounter (Signed)
Pt contacted. Pt states he has been on Lisinopril-HTCZ for years. Lips began swelling but not bad. No issues with breathing or shortness of breath or any other symptoms. Pt states he has not had anything new as far as meds or anything of that nature. Pt wife works for an eye dr and he said that lip swelling is a side effect of Lisinopril. Please advise. Thank you ?

## 2021-06-15 NOTE — Telephone Encounter (Signed)
Caller name:Tiffany Parton  ? ?On DPR? :No  ? ?Call back number:[phone] ? ?Provider they see: Wolfgang Phoenix  ? ?Reason for call:Pt is calling pt had open heart surgery a while back and pt has been on lisinopril-hydrochlorothiazide (ZESTORETIC) 10-12.5 MG tablet  but pt has notice that his lips are swelling and wonder if this is a side effect to this medication?  ? ?

## 2021-06-15 NOTE — Telephone Encounter (Signed)
Stop lisinopril ?Obviously if having severe swelling may need to go to the ER otherwise stopping the medication should prevent this ?May start amlodipine 5 mg 1 daily for blood pressure and heart, #30 with 3 refills ?Follow-up office visit with [place] within 7 to 12 days thank you ?

## 2021-06-15 NOTE — Telephone Encounter (Signed)
Patient advised per Dr Nicki Reaper: Stop lisinopril ?Obviously if having severe swelling may need to go to the ER otherwise stopping the medication should prevent this ?May start amlodipine 5 mg 1 daily for blood pressure and heart, #30 with 3 refills ?Follow-up office visit with [place] within 7 to 12 days thank you ? ?Patient verbalized understanding. Prescription sent electronically to pharmacy. ?

## 2021-07-07 ENCOUNTER — Other Ambulatory Visit: Payer: Self-pay | Admitting: Physician Assistant

## 2021-07-13 ENCOUNTER — Ambulatory Visit: Payer: Commercial Managed Care - PPO | Admitting: Nurse Practitioner

## 2021-07-13 ENCOUNTER — Encounter: Payer: Self-pay | Admitting: Nurse Practitioner

## 2021-07-13 VITALS — BP 126/64 | HR 71 | Ht 69.0 in | Wt 209.6 lb

## 2021-07-13 DIAGNOSIS — E785 Hyperlipidemia, unspecified: Secondary | ICD-10-CM

## 2021-07-13 DIAGNOSIS — I6523 Occlusion and stenosis of bilateral carotid arteries: Secondary | ICD-10-CM

## 2021-07-13 DIAGNOSIS — I2510 Atherosclerotic heart disease of native coronary artery without angina pectoris: Secondary | ICD-10-CM | POA: Diagnosis not present

## 2021-07-13 DIAGNOSIS — I10 Essential (primary) hypertension: Secondary | ICD-10-CM

## 2021-07-13 DIAGNOSIS — L4050 Arthropathic psoriasis, unspecified: Secondary | ICD-10-CM

## 2021-07-13 DIAGNOSIS — Z87891 Personal history of nicotine dependence: Secondary | ICD-10-CM

## 2021-07-13 MED ORDER — METOPROLOL TARTRATE 25 MG PO TABS: 25.0000 mg | ORAL_TABLET | Freq: Two times a day (BID) | ORAL | 3 refills | Status: DC

## 2021-07-13 MED ORDER — ROSUVASTATIN CALCIUM 40 MG PO TABS: 40.0000 mg | ORAL_TABLET | Freq: Every day | ORAL | 3 refills | Status: DC

## 2021-07-13 NOTE — Progress Notes (Signed)
Office Visit    Patient Name: Brian Brewer Date of Encounter: 07/13/2021  Primary Care Provider:  Kathyrn Drown, MD Primary Cardiologist:  Shelva Majestic, MD  Chief Complaint    56 year old male with a history of CAD s/p CABG x3 in 03/2021, carotid artery stenosis, hypertension, hyperlipidemia, gout, psoriatic arthritis, former tobacco use and GERD who presents for follow-up related to CAD s/p CABG x3.   Past Medical History    Past Medical History:  Diagnosis Date   Cancer ([place])    melanoma on the back was removed 10/2018   GERD (gastroesophageal reflux disease)    Gout    Hyperlipemia    Hypertension    Psoriatic arthritis ([place]) 12/11/2013   Dr Amil Amen    Past Surgical History:  Procedure Laterality Date   BACK SURGERY  2003   L4,5,S1 Fusion   CHOLECYSTECTOMY     COLONOSCOPY N/A 12/29/2016   Procedure: COLONOSCOPY;  Surgeon: Daneil Dolin, MD;  [facility];  Service: Endoscopy;  Laterality: N/A;  9:15 AM   CORONARY ARTERY BYPASS GRAFT N/A 03/17/2021   Procedure: CORONARY ARTERY BYPASS GRAFTING (CABG) X THREE USING RIGHT GREATER SAPHENOUS VEIN HARVESTED ENDOSCOPICALLY AND LEFT INTERNAL MAMMARY ARTERY.;  Surgeon: Dahlia Byes, MD;  Location: [facility];  Service: Open Heart Surgery;  Laterality: N/A;   LEFT HEART CATH AND CORONARY ANGIOGRAPHY N/A 03/16/2021   Procedure: LEFT HEART CATH AND CORONARY ANGIOGRAPHY;  Surgeon: Belva Crome, MD;  Location: [place] [facility];  Service: Cardiovascular;  Laterality: N/A;   LUMBAR FUSION     [place] yrs ago[place]   TEE WITHOUT CARDIOVERSION N/A 03/17/2021   Procedure: TRANSESOPHAGEAL ECHOCARDIOGRAM (TEE);  Surgeon: Dahlia Byes, MD;  Location: [facility];  Service: Open Heart Surgery;  Laterality: N/A;   TOTAL HIP ARTHROPLASTY Right 02/15/2019   Procedure: RIGHT TOTAL HIP ARTHROPLASTY ANTERIOR APPROACH;  Surgeon: Mcarthur Rossetti, MD;  [facility]: [facility];  Service: Orthopedics;  Laterality: Right;     Allergies  Allergies  Allergen Reactions   Amoxicillin-Pot Clavulanate Other (See Comments)    Cramps and diarrhea Has patient had a PCN reaction causing immediate rash, facial/tongue/throat swelling, SOB or lightheadedness with hypotension: No Has patient had a PCN reaction causing severe rash involving mucus membranes or skin necrosis: No Has patient had a PCN reaction that required hospitalization: No Has patient had a PCN reaction occurring within the last 10 years: No  If all of the above answers are "NO", then may proceed with Cephalosporin use.   Levofloxacin Other (See Comments)    Passed out (possibly due to medication)   Zithromax [Azithromycin] Diarrhea   Chantix [Varenicline Tartrate] Nausea And Vomiting   Oxycodone Diarrhea, Itching and Anxiety    History of Present Illness    56 year old male with the above past medical history including CAD s/p CABG x3 in 03/2021, carotid artery stenosis, hypertension, hyperlipidemia, gout, psoriatic arthritis, former tobacco use, and GERD.   He was evaluated by Dr. Harrington Challenger in 2014 in the setting of chest pain. Stress echo at the time was normal.  He has not been seen in the office since.  He saw his PCP on 03/15/2021 and complained of burning chest pain. EKG showed changes concerning for ST elevation.  He was advised to go to the [facility], ED for further evaluation.  Troponin was elevated, EKG showed new T wave inversion in aVL and ST elevation in leads II and III.  He was started on  IV heparin and transferred to [facility] for [facility] evaluation.    He was hospitalized from 03/15/2021 to 03/21/2021 in the setting of non-STEMI. Cardiac catheterization on 03/16/2021 showed multivessel disease (o-p LAD 95%, p-m LAD 100%, D1 60%, p-m Cx 50%, and pRCA 65% stenose), EF 50-55%.  CT surgery was consulted for sideration of CABG. Echocardiogram showed EF, 55 to 60%, normal LV function, moderate concentric LVH, mild mitral valve regurgitation. Pre-CABG  Dopplers showed 1 to 39% B ICA stenosis.  He underwent CABG x3 (LIMA-LAD, SVG-PDA, SVG-Diagonal) on 03/17/2021.  His postop course was uncomplicated.  He was discharged home in stable condition on 03/21/2021.  CT surgery for a follow-up outpatient visit on 03/29/2021.  He was cleared to drive short distances for his work he was advised not to lift more than 10 pounds, no climbing, full squat or pushing.   He was seen in the office on 04/12/2021 and was stable from a cardiac standpoint.  He did note dry itchy, flaky skin, to be related to interruption of Rinvoq therapy which she takes for psoriatic arthritis.  He saw CT surgery on 05/19/2021 and was stable.  He was cleared to follow-up as needed.  He presents today for follow-up.  Since his last visit was done well from a cardiac standpoint.  He denies any symptoms concerning for angina.  He does note some soreness in his chest near his sternal incision. He states he think he may have pulled his muscle over exerting himself.  Otherwise, he reports feeling well denies any additional concerns today.  Home Medications    Current Outpatient Medications  Medication Sig Dispense Refill   allopurinol (ZYLOPRIM) 100 MG tablet Take 100 mg by mouth daily.     amLODipine (NORVASC) 5 MG tablet Take 1 tablet (5 mg total) by mouth daily. 30 tablet 3   aspirin EC 325 MG EC tablet Take 1 tablet (325 mg total) by mouth daily. 30 tablet 0   esomeprazole (NEXIUM) 40 MG capsule Take 1 capsule (40 mg total) by mouth daily. 90 capsule 1   metoprolol tartrate (LOPRESSOR) 25 MG tablet Take 1 tablet (25 mg total) by mouth 2 (two) times daily. 180 tablet 3   rosuvastatin (CRESTOR) 40 MG tablet Take 1 tablet (40 mg total) by mouth daily. 90 tablet 3   No current facility-administered medications for this visit.     Review of Systems    He denies chest pain, palpitations, dyspnea, pnd, orthopnea, n, v, dizziness, syncope, edema, weight gain, or early satiety. All other systems  reviewed and are otherwise negative except as noted above.   Physical Exam    VS:  BP 126/64   Pulse 71   Ht '5\' 9"'$  (1.753 m)   Wt 209 lb 9.6 oz (95.1 kg)   SpO2 98%   BMI 30.95 kg/m   GEN: Well nourished, well developed, in no acute distress. HEENT: normal. Neck: Supple, no JVD, carotid bruits, or masses. Cardiac: RRR, no murmurs, rubs, or gallops. No clubbing, cyanosis, edema.  Radials/DP/PT 2+ and equal bilaterally.  Respiratory:  Respirations regular and unlabored, clear to auscultation bilaterally. GI: Soft, nontender, nondistended, BS + x 4. MS: no deformity or atrophy. Skin: warm and dry, no rash. Neuro:  Strength and sensation are intact. Psych: Normal affect.  Accessory Clinical Findings    ECG personally reviewed by me today -no EKG in office today.  Lab Results  Component Value Date   WBC 11.1 (H) 04/12/2021   HGB 12.7 (L)  04/12/2021   HCT 37.6 04/12/2021   MCV 87 04/12/2021   PLT 321 04/12/2021   Lab Results  Component Value Date   CREATININE 1.00 05/03/2021   BUN 10 05/03/2021   NA 135 05/03/2021   K 4.7 05/03/2021   CL 100 05/03/2021   CO2 24 05/03/2021   Lab Results  Component Value Date   ALT 41 03/15/2021   AST 28 03/15/2021   ALKPHOS 54 03/15/2021   BILITOT 0.3 03/15/2021   Lab Results  Component Value Date   CHOL 97 (L) 05/03/2021   HDL 26 (L) 05/03/2021   LDLCALC 41 05/03/2021   TRIG 183 (H) 05/03/2021   CHOLHDL 3.7 05/03/2021    Lab Results  Component Value Date   HGBA1C 5.7 (H) 03/16/2021    Assessment & Plan    1. CAD: S/p CABG x3 (LIMA-LAD, SVG-PDA, SVG-Diagonal) on 03/17/2021. Stable with no anginal symptoms.  Plavix was discontinued per CT surgery.  Continue aspirin, metoprolol, amlodipine, and Crestor.   2. Carotid artery stenosis: Pre-CABG Dopplers showed 1-39% B ICA stenosis. Asymptomatic. Consider repeat study if he develops symptoms in future.   3. Hypertension: BP well controlled. Continue current antihypertensive  regimen.    4. Hyperlipidemia: LDL was 41 in April 2023.  Continue aspirin, Crestor.  5. Psoriatic arthritis: Prior dry, itchy, flaky skin resolved following resumption of Rinvoq.   6. Former tobacco use: He has quit smoking since the time of his surgery. I congratulated him on this.     7. Disposition: F/u in 6 months.  Lenna Sciara, NP 07/13/2021, 6:01 PM

## 2021-07-13 NOTE — Patient Instructions (Signed)
Medication Instructions:  Your physician recommends that you continue on your current medications as directed. Please refer to the Current Medication list given to you today.  *If you need a refill on your cardiac medications before your next appointment, please call your pharmacy*   Lab Work: NONE ordered at this time of appointment   If you have labs (blood work) drawn today and your tests are completely normal, you will receive your results only by: [place] (if you have MyChart) OR A paper copy in the mail If you have any lab test that is abnormal or we need to change your treatment, we will call you to review the results.   Testing/Procedures: NONE ordered at this time of appointment     Follow-Up: At [facility], you and your health needs are our priority.  As part of our continuing mission to provide you with exceptional heart care, we have created designated Provider Care Teams.  These Care Teams include your primary Cardiologist (physician) and Advanced Practice Providers (APPs -  Physician Assistants and Nurse Practitioners) who all work together to provide you with the care you need, when you need it.  We recommend signing up for the patient portal called "MyChart".  Sign up information is provided on this After Visit Summary.  MyChart is used to connect with patients for Virtual Visits (Telemedicine).  Patients are able to view lab/test results, encounter notes, upcoming appointments, etc.  Non-urgent messages can be sent to your provider as well.   To learn more about what you can do with MyChart, go to [email or web address].    Your next appointment:   6 month(s)  The format for your next appointment:   In Person  Provider:   Shelva Majestic, MD     Other Instructions   Important Information About Sugar

## 2021-08-09 ENCOUNTER — Other Ambulatory Visit: Payer: Self-pay | Admitting: Physician Assistant

## 2021-08-21 ENCOUNTER — Ambulatory Visit
Admission: EM | Admit: 2021-08-21 | Discharge: 2021-08-21 | Disposition: A | Discharge status: Home / Self Care | Payer: Commercial Managed Care - PPO | Attending: Nurse Practitioner | Admitting: Nurse Practitioner

## 2021-08-21 ENCOUNTER — Encounter: Payer: Self-pay | Admitting: Emergency Medicine

## 2021-08-21 DIAGNOSIS — J020 Streptococcal pharyngitis: Secondary | ICD-10-CM

## 2021-08-21 HISTORY — DX: Acute myocardial infarction, unspecified: I21.9

## 2021-08-21 LAB — POCT RAPID STREP A (OFFICE): Rapid Strep A Screen: POSITIVE — AB

## 2021-08-21 MED ORDER — PREDNISONE 20 MG PO TABS: 40.0000 mg | ORAL_TABLET | Freq: Every day | ORAL | 0 refills | Status: AC

## 2021-08-21 MED ORDER — CLINDAMYCIN HCL 300 MG PO CAPS: 300.0000 mg | ORAL_CAPSULE | Freq: Three times a day (TID) | ORAL | 0 refills | Status: AC

## 2021-08-21 NOTE — ED Provider Notes (Signed)
[facility]    CSN: [id number] Arrival date & time: 08/21/21  0858      History   Chief Complaint Chief Complaint  Patient presents with   Sore Throat    Headache and diarrhea - Entered by patient    HPI Brian Brewer is a 56 y.o. male.   The history is provided by the patient.   Patient presents for complaints of sore throat, headache, fatigue, and diarrhea.  Patient states symptoms started approximately 2 to 3 days ago.  He denies fever, chills, nasal congestion, runny nose, cough, abdominal pain, nausea, or vomiting.  He has been taking over-the-counter medication for his symptoms with no relief.  His grandchildren had strep throat approximately 2 weeks ago.  Past Medical History:  Diagnosis Date   Cancer ([place])    melanoma on the back was removed 10/2018   GERD (gastroesophageal reflux disease)    Gout    Heart attack ([place])    Hyperlipemia    Hypertension    Psoriatic arthritis ([place]) 12/11/2013   Dr Amil Amen     Patient Active Problem List   Diagnosis Date Noted   Atherosclerotic heart disease 05/19/2021   Respiratory illness 04/23/2021   Hx of CABG 03/29/2021   S/P CABG x 3 03/17/2021   NSTEMI (non-ST elevated myocardial infarction) ([place]) 03/15/2021   Status post total replacement of right hip 02/28/2019   Unilateral primary osteoarthritis, right hip 01/16/2019   Bronchitis due to tobacco use 10/27/2017   Psoriatic arthritis ([place]) 12/11/2013   Hyperuricemia 12/11/2013   HTN (hypertension), benign 10/22/2012   Fasting hyperglycemia 10/22/2012   Hyperlipemia 10/22/2012   Allergic rhinitis 05/21/2012   GERD 05/27/2009    Past Surgical History:  Procedure Laterality Date   BACK SURGERY  2003   L4,5,S1 Fusion   CHOLECYSTECTOMY     COLONOSCOPY N/A 12/29/2016   Procedure: COLONOSCOPY;  Surgeon: Daneil Dolin, MD;  [facility];  Service: Endoscopy;  Laterality: N/A;  9:15 AM   CORONARY ARTERY BYPASS GRAFT N/A 03/17/2021   Procedure:  CORONARY ARTERY BYPASS GRAFTING (CABG) X THREE USING RIGHT GREATER SAPHENOUS VEIN HARVESTED ENDOSCOPICALLY AND LEFT INTERNAL MAMMARY ARTERY.;  Surgeon: Dahlia Byes, MD;  Location: [place];  Service: Open Heart Surgery;  Laterality: N/A;   LEFT HEART CATH AND CORONARY ANGIOGRAPHY N/A 03/16/2021   Procedure: LEFT HEART CATH AND CORONARY ANGIOGRAPHY;  Surgeon: Belva Crome, MD;  Location: [place] [facility];  Service: Cardiovascular;  Laterality: N/A;   LUMBAR FUSION     [place] yrs ago-[place]   TEE WITHOUT CARDIOVERSION N/A 03/17/2021   Procedure: TRANSESOPHAGEAL ECHOCARDIOGRAM (TEE);  Surgeon: Dahlia Byes, MD;  Location: [place];  Service: Open Heart Surgery;  Laterality: N/A;   TOTAL HIP ARTHROPLASTY Right 02/15/2019   Procedure: RIGHT TOTAL HIP ARTHROPLASTY ANTERIOR APPROACH;  Surgeon: Mcarthur Rossetti, MD;  [facility];  Service: Orthopedics;  Laterality: Right;       Home Medications    Prior to Admission medications   Medication Sig Start Date End Date Taking? Authorizing Provider  clindamycin (CLEOCIN) 300 MG capsule Take 1 capsule (300 mg total) by mouth 3 (three) times daily for 10 days. 08/21/21 08/31/21 Yes Luv Mish-Warren, Alda Lea, NP  predniSONE (DELTASONE) 20 MG tablet Take 2 tablets (40 mg total) by mouth daily with breakfast for 5 days. 08/21/21 08/26/21 Yes Aziya Arena-Warren, Alda Lea, NP  allopurinol (ZYLOPRIM) 100 MG tablet Take 100 mg by mouth daily.    [provider]  amLODipine (NORVASC) 5 MG tablet Take 1 tablet (5 mg total) by mouth daily. 06/15/21   Kathyrn Drown, MD  aspirin EC 325 MG EC tablet Take 1 tablet (325 mg total) by mouth daily. 03/21/21   Barrett, Erin R, PA-C  esomeprazole (NEXIUM) 40 MG capsule Take 1 capsule (40 mg total) by mouth daily. 01/18/21   Kathyrn Drown, MD  metoprolol tartrate (LOPRESSOR) 25 MG tablet Take 1 tablet (25 mg total) by mouth 2 (two) times daily. 07/13/21   Lenna Sciara, NP  rosuvastatin (CRESTOR) 40 MG tablet Take  1 tablet (40 mg total) by mouth daily. 07/13/21   Lenna Sciara, NP    Family History Family History  Problem Relation Age of Onset   Heart disease Father    Hypertension Father    Leukemia Father    Anesthesia problems Neg Hx    Hypotension Neg Hx    Malignant hyperthermia Neg Hx    Pseudochol deficiency Neg Hx     Social History Social History   Tobacco Use   Smoking status: Former    Packs/day: 0.50    Years: 20.00    Total pack years: 10.00    Types: Cigarettes    Quit date: 03/15/2021    Years since quitting: 0.4   Smokeless tobacco: Never  Vaping Use   Vaping Use: Never used  Substance Use Topics   Alcohol use: No   Drug use: No     Allergies   Amoxicillin-pot clavulanate, Levofloxacin, Zithromax [azithromycin], Chantix [varenicline tartrate], and Oxycodone   Review of Systems Review of Systems Per HPI  Physical Exam Triage Vital Signs ED Triage Vitals [08/21/21 0937]  Enc Vitals Group     BP (!) 148/76     Pulse Rate 67     Resp 18     Temp 98.7 F (37.1 C)     Temp Source Oral     SpO2 96 %     Weight      Height      Head Circumference      Peak Flow      Pain Score      Pain Loc      Pain Edu?      Excl. in [place]?    No data found.  Updated Vital Signs BP (!) 148/76 (BP Location: Right Arm)   Pulse 67   Temp 98.7 F (37.1 C) (Oral)   Resp 18   SpO2 96%   Visual Acuity Right Eye Distance:   Left Eye Distance:   Bilateral Distance:    Right Eye Near:   Left Eye Near:    Bilateral Near:     Physical Exam Vitals and nursing note reviewed.  Constitutional:      General: He is not in acute distress.    Appearance: He is well-developed.  HENT:     Head: Normocephalic.     Right Ear: Tympanic membrane and ear canal normal.     Left Ear: Tympanic membrane and ear canal normal.     Nose: No congestion.     Mouth/Throat:     Pharynx: Uvula midline. Pharyngeal swelling, oropharyngeal exudate, posterior oropharyngeal erythema and  uvula swelling present.     Tonsils: 2+ on the right. 2+ on the left.  Eyes:     Conjunctiva/sclera: Conjunctivae normal.     Pupils: Pupils are equal, round, and reactive to light.  Cardiovascular:     Rate and Rhythm:  Normal rate and regular rhythm.     Heart sounds: Normal heart sounds.  Pulmonary:     Effort: Pulmonary effort is normal.     Breath sounds: Normal breath sounds. No wheezing.  Abdominal:     General: Bowel sounds are normal.     Palpations: Abdomen is soft.     Tenderness: There is no abdominal tenderness.  Musculoskeletal:     Cervical back: Normal range of motion.  Lymphadenopathy:     Cervical: Cervical adenopathy present.  Skin:    General: Skin is warm and dry.  Neurological:     General: No focal deficit present.     Mental Status: He is alert and oriented to person, place, and time.  Psychiatric:        Mood and Affect: Mood normal.        Behavior: Behavior normal.      UC Treatments / Results  Labs (all labs ordered are listed, but only abnormal results are displayed) Labs Reviewed  POCT RAPID STREP A (OFFICE) - Abnormal; Notable for the following components:      Result Value   Rapid Strep A Screen Positive (*)    All other components within normal limits    EKG   Radiology No results found.  Procedures Procedures (including critical care time)  Medications Ordered in UC Medications - No data to display  Initial Impression / Assessment and Plan / UC Course  I have reviewed the triage vital signs and the nursing notes.  Pertinent labs & imaging results that were available during my care of the patient were reviewed by me and considered in my medical decision making (see chart for details).  Patient presents for complaints of sore throat, headache, fatigue, and diarrhea.  Symptoms have been present for the past 2 to 3 days.  Rapid strep test is positive.  We will treat patient with clindamycin and prednisone.  Supportive care  recommendations were provided to the patient.  Patient advised to follow-up if symptoms do not improve. Final Clinical Impressions(s) / UC Diagnoses   Final diagnoses:  Streptococcal sore throat     Discharge Instructions      Your strep test was positive today. Take medication as prescribed. Increase fluids and allow for plenty of rest. Recommend over-the-counter Tylenol or Ibuprofen as needed for pain, fever, or general discomfort. Warm salt water gargles 3-4 times daily to help with throat pain or discomfort. Recommend a diet with soft foods to include soups, broths, puddings, yogurt, Jell-O's, or popsicles until symptoms improve. Change toothbrush after 3 days. Follow-up if symptoms do not improve.     ED Prescriptions     Medication Sig Dispense Auth. Provider   clindamycin (CLEOCIN) 300 MG capsule Take 1 capsule (300 mg total) by mouth 3 (three) times daily for 10 days. 30 capsule Angella Montas-Warren, Alda Lea, NP   predniSONE (DELTASONE) 20 MG tablet Take 2 tablets (40 mg total) by mouth daily with breakfast for 5 days. 10 tablet Neosha Switalski-Warren, Alda Lea, NP      PDMP not reviewed this encounter.   Tish Men, NP 08/21/21 1011

## 2021-08-21 NOTE — Discharge Instructions (Addendum)
Your strep test was positive today. Take medication as prescribed. Increase fluids and allow for plenty of rest. Recommend over-the-counter Tylenol or Ibuprofen as needed for pain, fever, or general discomfort. Warm salt water gargles 3-4 times daily to help with throat pain or discomfort. Recommend a diet with soft foods to include soups, broths, puddings, yogurt, Jell-O's, or popsicles until symptoms improve. Change toothbrush after 3 days. Follow-up if symptoms do not improve.

## 2021-08-21 NOTE — ED Triage Notes (Signed)
Sore throat, headache, diarrhea since Wednesday.   Has been taking over the counter flu and cold medication without relief.  States grand kids had strep and he was exposed to them

## 2021-08-23 ENCOUNTER — Other Ambulatory Visit: Payer: Self-pay | Admitting: Family Medicine

## 2021-10-04 ENCOUNTER — Other Ambulatory Visit: Payer: Self-pay | Admitting: Family Medicine

## 2021-10-24 ENCOUNTER — Other Ambulatory Visit: Payer: Self-pay

## 2021-10-24 ENCOUNTER — Ambulatory Visit
Admission: EM | Admit: 2021-10-24 | Discharge: 2021-10-24 | Disposition: A | Discharge status: Home / Self Care | Payer: Commercial Managed Care - PPO | Attending: Physician Assistant | Admitting: Physician Assistant

## 2021-10-24 ENCOUNTER — Encounter: Payer: Self-pay | Admitting: Emergency Medicine

## 2021-10-24 DIAGNOSIS — Z1152 Encounter for screening for COVID-19: Secondary | ICD-10-CM | POA: Diagnosis present

## 2021-10-24 DIAGNOSIS — J069 Acute upper respiratory infection, unspecified: Secondary | ICD-10-CM | POA: Insufficient documentation

## 2021-10-24 LAB — RESP PANEL BY RT-PCR (FLU A&B, COVID) ARPGX2
Influenza A by PCR: NEGATIVE
Influenza B by PCR: NEGATIVE
SARS Coronavirus 2 by RT PCR: NEGATIVE

## 2021-10-24 MED ORDER — PREDNISONE 20 MG PO TABS: 40.0000 mg | ORAL_TABLET | Freq: Every day | ORAL | 0 refills | Status: DC

## 2021-10-24 MED ORDER — POLYMYXIN B-TRIMETHOPRIM 10000-0.1 UNIT/ML-% OP SOLN: 1.0000 [drp] | OPHTHALMIC | 0 refills | Status: DC

## 2021-10-24 NOTE — ED Provider Notes (Signed)
[facility]    CSN: [id number] Arrival date & time: 10/24/21  0909      History   Chief Complaint Chief Complaint  Patient presents with   Sore Throat    HPI Brian Brewer is a 56 y.o. male.   Patient here today for evaluation of sore throat, sinus drainage, headache, and cough that started yesterday morning.  He reports that he has taken over-the-counter medication without significant relief.  He denies any nausea, vomiting or diarrhea.  He notes that headache is in the frontal area.  The history is provided by the patient.  Sore Throat Associated symptoms include headaches. Pertinent negatives include no abdominal pain and no shortness of breath.    Past Medical History:  Diagnosis Date   Cancer ([place])    melanoma on the back was removed 10/2018   GERD (gastroesophageal reflux disease)    Gout    Heart attack ([place])    Hyperlipemia    Hypertension    Psoriatic arthritis ([address]) 12/11/2013   Dr Amil Amen     Patient Active Problem List   Diagnosis Date Noted   Atherosclerotic heart disease 05/19/2021   Respiratory illness 04/23/2021   Hx of CABG 03/29/2021   S/P CABG x 3 03/17/2021   NSTEMI (non-ST elevated myocardial infarction) ([place]) 03/15/2021   Status post total replacement of right hip 02/28/2019   Unilateral primary osteoarthritis, right hip 01/16/2019   Bronchitis due to tobacco use 10/27/2017   Psoriatic arthritis ([place]) 12/11/2013   Hyperuricemia 12/11/2013   HTN (hypertension), benign 10/22/2012   Fasting hyperglycemia 10/22/2012   Hyperlipemia 10/22/2012   Allergic rhinitis 05/21/2012   GERD 05/27/2009    Past Surgical History:  Procedure Laterality Date   BACK SURGERY  2003   L4,5,S1 Fusion   CHOLECYSTECTOMY     COLONOSCOPY N/A 12/29/2016   Procedure: COLONOSCOPY;  Surgeon: Daneil Dolin, MD;  [facility];  Service: Endoscopy;  Laterality: N/A;  9:15 AM   CORONARY ARTERY BYPASS GRAFT N/A 03/17/2021   Procedure: CORONARY  ARTERY BYPASS GRAFTING (CABG) X THREE USING RIGHT GREATER SAPHENOUS VEIN HARVESTED ENDOSCOPICALLY AND LEFT INTERNAL MAMMARY ARTERY.;  Surgeon: Dahlia Byes, MD;  Location: [facility];  Service: Open Heart Surgery;  Laterality: N/A;   LEFT HEART CATH AND CORONARY ANGIOGRAPHY N/A 03/16/2021   Procedure: LEFT HEART CATH AND CORONARY ANGIOGRAPHY;  Surgeon: Belva Crome, MD;  Location: [facility];  Service: Cardiovascular;  Laterality: N/A;   LUMBAR FUSION     [place]   TEE WITHOUT CARDIOVERSION N/A 03/17/2021   Procedure: TRANSESOPHAGEAL ECHOCARDIOGRAM (TEE);  Surgeon: Dahlia Byes, MD;  Location: [facility];  Service: Open Heart Surgery;  Laterality: N/A;   TOTAL HIP ARTHROPLASTY Right 02/15/2019   Procedure: RIGHT TOTAL HIP ARTHROPLASTY ANTERIOR APPROACH;  Surgeon: Mcarthur Rossetti, MD;  [facility];  Service: Orthopedics;  Laterality: Right;       Home Medications    Prior to Admission medications   Medication Sig Start Date End Date Taking? Authorizing Provider  allopurinol (ZYLOPRIM) 100 MG tablet TAKE 1 TABLET EVERY DAY 08/23/21   Sallee Lange A, MD  amLODipine (NORVASC) 5 MG tablet TAKE 1 TABLET (5 MG TOTAL) BY MOUTH DAILY. 10/05/21   Kathyrn Drown, MD  aspirin EC 325 MG EC tablet Take 1 tablet (325 mg total) by mouth daily. 03/21/21   Barrett, Erin R, PA-C  esomeprazole (NEXIUM) 40 MG capsule TAKE 1 CAPSULE EVERY DAY 08/23/21  Kathyrn Drown, MD  metoprolol tartrate (LOPRESSOR) 25 MG tablet Take 1 tablet (25 mg total) by mouth 2 (two) times daily. 07/13/21   Lenna Sciara, NP  rosuvastatin (CRESTOR) 40 MG tablet Take 1 tablet (40 mg total) by mouth daily. 07/13/21   Lenna Sciara, NP    Family History Family History  Problem Relation Age of Onset   Heart disease Father    Hypertension Father    Leukemia Father    Anesthesia problems Neg Hx    Hypotension Neg Hx    Malignant hyperthermia Neg Hx    Pseudochol deficiency Neg Hx     Social  History Social History   Tobacco Use   Smoking status: Former    Packs/day: 0.50    Years: 20.00    Total pack years: 10.00    Types: Cigarettes    Quit date: 03/15/2021    Years since quitting: 0.6   Smokeless tobacco: Never  Vaping Use   Vaping Use: Never used  Substance Use Topics   Alcohol use: No   Drug use: No     Allergies   Amoxicillin-pot clavulanate, Levofloxacin, Zithromax [azithromycin], Chantix [varenicline tartrate], and Oxycodone   Review of Systems Review of Systems  Constitutional:  Negative for chills and fever.  HENT:  Positive for congestion and sore throat. Negative for ear pain.   Eyes:  Negative for discharge and redness.  Respiratory:  Positive for cough. Negative for shortness of breath.   Gastrointestinal:  Negative for abdominal pain, diarrhea, nausea and vomiting.  Neurological:  Positive for headaches.     Physical Exam Triage Vital Signs ED Triage Vitals  Enc Vitals Group     BP 10/24/21 0958 123/78     Pulse Rate 10/24/21 0958 71     Resp 10/24/21 0958 18     Temp 10/24/21 0958 98 F (36.7 C)     Temp Source 10/24/21 0958 Oral     SpO2 10/24/21 0958 96 %     Weight --      Height --      Head Circumference --      Peak Flow --      Pain Score 10/24/21 0955 4     Pain Loc --      Pain Edu? --      Excl. in [place]? --    No data found.  Updated Vital Signs BP 123/78 (BP Location: Right Arm) Comment (BP Location): large cuff  Pulse 71   Temp 98 F (36.7 C) (Oral)   Resp 18   SpO2 96%      Physical Exam Vitals and nursing note reviewed.  Constitutional:      General: He is not in acute distress.    Appearance: Normal appearance. He is not ill-appearing.  HENT:     Head: Normocephalic and atraumatic.     Nose: Congestion present.     Mouth/Throat:     Mouth: Mucous membranes are moist.     Pharynx: Oropharynx is clear. No oropharyngeal exudate or posterior oropharyngeal erythema.  Eyes:     Conjunctiva/sclera:  Conjunctivae normal.  Cardiovascular:     Rate and Rhythm: Normal rate and regular rhythm.     Heart sounds: Normal heart sounds. No murmur heard. Pulmonary:     Effort: Pulmonary effort is normal. No respiratory distress.     Breath sounds: Normal breath sounds. No wheezing, rhonchi or rales.  Skin:    General: Skin is warm and  dry.  Neurological:     Mental Status: He is alert.  Psychiatric:        Mood and Affect: Mood normal.        Thought Content: Thought content normal.      UC Treatments / Results  Labs (all labs ordered are listed, but only abnormal results are displayed) Labs Reviewed  RESP PANEL BY RT-PCR (FLU A&B, COVID) [id number]    EKG   Radiology No results found.  Procedures Procedures (including critical care time)  Medications Ordered in UC Medications - No data to display  Initial Impression / Assessment and Plan / UC Course  I have reviewed the triage vital signs and the nursing notes.  Pertinent labs & imaging results that were available during my care of the patient were reviewed by me and considered in my medical decision making (see chart for details).    Suspect likely viral etiology of symptoms.  Recommended screening for COVID and flu and patient is agreeable with same.  Encouraged symptomatic treatment while awaiting results.  Encouraged follow-up if no gradual improvement or with any further concerns.  Final Clinical Impressions(s) / UC Diagnoses   Final diagnoses:  Viral upper respiratory tract infection  Encounter for screening for COVID-19   Discharge Instructions   None    ED Prescriptions     Medication Sig Dispense Auth. Provider   trimethoprim-polymyxin b (POLYTRIM) ophthalmic solution  (Status: Discontinued) Place 1 drop into the left eye every 4 (four) hours for 7 days. 10 mL Francene Finders, PA-C   predniSONE (DELTASONE) 20 MG tablet  (Status: Discontinued) Take 2 tablets (40 mg total) by mouth daily with breakfast for 5  days. 10 tablet Francene Finders, PA-C      PDMP not reviewed this encounter.   Francene Finders, PA-C 10/24/21 1052

## 2021-10-24 NOTE — ED Triage Notes (Signed)
Complains of sore throat, sinus drainage in throat, headache-front of head.  Symptoms started yesterday morning when waking.  Patient took OTC flu and cold capsules with no relief.  Has taken tylenol.  No covid test.

## 2021-11-21 ENCOUNTER — Other Ambulatory Visit: Payer: Self-pay | Admitting: Family Medicine

## 2021-11-27 ENCOUNTER — Other Ambulatory Visit: Payer: Self-pay | Admitting: Family Medicine

## 2021-11-29 ENCOUNTER — Other Ambulatory Visit: Payer: Self-pay | Admitting: Family Medicine

## 2021-12-18 ENCOUNTER — Other Ambulatory Visit: Payer: Self-pay | Admitting: Family Medicine

## 2021-12-22 ENCOUNTER — Other Ambulatory Visit: Payer: Self-pay | Admitting: Family Medicine

## 2022-01-05 ENCOUNTER — Ambulatory Visit: Payer: Commercial Managed Care - PPO | Admitting: Cardiovascular Disease

## 2022-01-12 ENCOUNTER — Telehealth: Payer: Self-pay | Admitting: Cardiovascular Disease

## 2022-01-12 NOTE — Telephone Encounter (Signed)
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   [facility] in [place]  Fax: [phone]  Phone number: [phone]

## 2022-01-12 NOTE — Telephone Encounter (Signed)
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 He would like the clearance faxed to   [facility] in [place]  Fax: [phone]  Phone number: [phone]

## 2022-01-13 ENCOUNTER — Ambulatory Visit: Payer: Commercial Managed Care - PPO | Attending: Cardiovascular Disease | Admitting: Nurse Practitioner

## 2022-01-13 ENCOUNTER — Encounter: Payer: Self-pay | Admitting: Nurse Practitioner

## 2022-01-13 VITALS — BP 138/80 | HR 81 | Ht 70.0 in | Wt 212.2 lb

## 2022-01-13 DIAGNOSIS — I2510 Atherosclerotic heart disease of native coronary artery without angina pectoris: Secondary | ICD-10-CM | POA: Diagnosis not present

## 2022-01-13 DIAGNOSIS — I6523 Occlusion and stenosis of bilateral carotid arteries: Secondary | ICD-10-CM

## 2022-01-13 DIAGNOSIS — E785 Hyperlipidemia, unspecified: Secondary | ICD-10-CM

## 2022-01-13 DIAGNOSIS — I10 Essential (primary) hypertension: Secondary | ICD-10-CM

## 2022-01-13 MED ORDER — CARVEDILOL 6.25 MG PO TABS: 6.2500 mg | ORAL_TABLET | Freq: Two times a day (BID) | ORAL | 3 refills | Status: DC

## 2022-01-13 NOTE — Progress Notes (Signed)
Office Visit    Patient Name: Brian Brewer Date of Encounter: 01/13/2022  Primary Care Provider:  Kathyrn Drown, MD Primary Cardiologist:  Shelva Majestic, MD  Chief Complaint    56 year old male with a history of CAD s/p CABG x3 in 03/2021, carotid artery stenosis, hypertension, hyperlipidemia, gout, psoriatic arthritis, former tobacco use and GERD who presents for follow-up related to CAD.   Past Medical History    Past Medical History:  Diagnosis Date   Cancer ([place])    melanoma on the back was removed 10/2018   GERD (gastroesophageal reflux disease)    Gout    Heart attack ([place])    Hyperlipemia    Hypertension    Psoriatic arthritis ([place]) 12/11/2013   Dr Amil Amen    Past Surgical History:  Procedure Laterality Date   BACK SURGERY  2003   L4,5,S1 Fusion   CHOLECYSTECTOMY     COLONOSCOPY N/A 12/29/2016   Procedure: COLONOSCOPY;  Surgeon: Daneil Dolin, MD;  [facility];  Service: Endoscopy;  Laterality: N/A;  9:15 AM   CORONARY ARTERY BYPASS GRAFT N/A 03/17/2021   Procedure: CORONARY ARTERY BYPASS GRAFTING (CABG) X THREE USING RIGHT GREATER SAPHENOUS VEIN HARVESTED ENDOSCOPICALLY AND LEFT INTERNAL MAMMARY ARTERY.;  Surgeon: Dahlia Byes, MD;  Location: [facility];  Service: Open Heart Surgery;  Laterality: N/A;   LEFT HEART CATH AND CORONARY ANGIOGRAPHY N/A 03/16/2021   Procedure: LEFT HEART CATH AND CORONARY ANGIOGRAPHY;  Surgeon: Belva Crome, MD;  Location: [facility];  Service: Cardiovascular;  Laterality: N/A;   LUMBAR FUSION     [place]rs ago[place]   TEE WITHOUT CARDIOVERSION N/A 03/17/2021   Procedure: TRANSESOPHAGEAL ECHOCARDIOGRAM (TEE);  Surgeon: Dahlia Byes, MD;  Location: [facility];  Service: Open Heart Surgery;  Laterality: N/A;   TOTAL HIP ARTHROPLASTY Right 02/15/2019   Procedure: RIGHT TOTAL HIP ARTHROPLASTY ANTERIOR APPROACH;  Surgeon: Mcarthur Rossetti, MD;  [facility];  Service: Orthopedics;  Laterality: Right;     Allergies  Allergies  Allergen Reactions   Amoxicillin-Pot Clavulanate Other (See Comments)    Cramps and diarrhea Has patient had a PCN reaction causing immediate rash, facial/tongue/throat swelling, SOB or lightheadedness with hypotension: No Has patient had a PCN reaction causing severe rash involving mucus membranes or skin necrosis: No Has patient had a PCN reaction that required hospitalization: No Has patient had a PCN reaction occurring within the last 10 years: No  If all of the above answers are "NO", then may proceed with Cephalosporin use.   Levofloxacin Other (See Comments)    Passed out (possibly due to medication)   Zithromax [Azithromycin] Diarrhea   Chantix [Varenicline Tartrate] Nausea And Vomiting   Oxycodone Diarrhea, Itching and Anxiety    History of Present Illness    56 year old male with the above past medical history including CAD s/p CABG x3 in 03/2021, carotid artery stenosis, hypertension, hyperlipidemia, gout, psoriatic arthritis, former tobacco use, and GERD.   He was evaluated by Dr. Harrington Challenger in 2014 in the setting of chest pain. Stress echo at the time was normal.  He has not been seen in the office since.  He was hospitalized in 03/2021 in the setting of non-STEMI. Cardiac catheterization on 03/16/2021 revealed multivessel disease (o-p LAD 95%, p-m LAD 100%, D1 60%, p-m Cx 50%, and pRCA 65% stenose), EF 50-55%. He underwent CABG x3 (LIMA-LAD, SVG-PDA, SVG-Diagonal) on 03/17/2021. Echocardiogram showed EF, 55 to 60%, normal LV function, moderate concentric LVH, mild  mitral valve regurgitation. Pre-CABG Dopplers showed 1 to 39% B ICA stenosis.     He was last seen in the office on 07/13/2021 and was stable from a cardiac standpoint. He presents today for follow-up.  Since his last visit he has been stable from a cardiac standpoint. He denies any symptoms concerning for angina.  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  Overall, he reports feeling well.   Home Medications    Current Outpatient Medications  Medication Sig Dispense Refill   allopurinol (ZYLOPRIM) 100 MG tablet TAKE 1 TABLET DAILY 90 tablet 1   amLODipine (NORVASC) 5 MG tablet TAKE 1 TABLET (5 MG TOTAL) BY MOUTH DAILY. 30 tablet 1   aspirin EC 81 MG tablet Take 81 mg by mouth daily. Swallow whole.     carvedilol (COREG) 6.25 MG tablet Take 1 tablet (6.25 mg total) by mouth 2 (two) times daily. 180 tablet 3   esomeprazole (NEXIUM) 40 MG capsule TAKE 1 CAPSULE BY MOUTH EVERY DAY 30 capsule 0   ondansetron (ZOFRAN) 4 MG tablet Take 4 mg by mouth every 8 (eight) hours as needed.     OTEZLA 30 MG TABS Take 1 tablet by mouth 2 (two) times daily.     No current facility-administered medications for this visit.     Review of Systems    He denies chest pain, palpitations, dyspnea, pnd, orthopnea, n, v, dizziness, syncope, edema, weight gain, or early satiety. All other systems reviewed and are otherwise negative except as noted above.   Physical Exam    VS:  BP 138/80   Pulse 81   Ht '5\' 10"'$  (1.778 m)   Wt 212 lb 3.2 oz (96.3 kg)   SpO2 97%   BMI 30.45 kg/m  GEN: Well nourished, well developed, in no acute distress. HEENT: normal. Neck: Supple, no JVD, carotid bruits, or masses. Cardiac: RRR, no murmurs, rubs, or gallops. No clubbing, cyanosis, edema.  Radials/DP/PT 2+ and equal bilaterally.  Respiratory:  Respirations regular and unlabored, clear to auscultation bilaterally. GI: Soft, nontender, nondistended, BS + x 4. MS: no deformity or atrophy. Skin: warm and dry, no rash. Neuro:  Strength and sensation are intact. Psych: Normal affect.  Accessory Clinical Findings    ECG personally reviewed by me today - No EKG in office today.     Lab Results  Component Value Date   WBC 11.1 (H) 04/12/2021   HGB 12.7 (L) 04/12/2021   HCT 37.6 04/12/2021   MCV 87 04/12/2021   PLT 321 04/12/2021   Lab Results  Component Value  Date   CREATININE 1.00 05/03/2021   BUN 10 05/03/2021   NA 135 05/03/2021   K 4.7 05/03/2021   CL 100 05/03/2021   CO2 24 05/03/2021   Lab Results  Component Value Date   ALT 41 03/15/2021   AST 28 03/15/2021   ALKPHOS 54 03/15/2021   BILITOT 0.3 03/15/2021   Lab Results  Component Value Date   CHOL 97 (L) 05/03/2021   HDL 26 (L) 05/03/2021   LDLCALC 41 05/03/2021   TRIG 183 (H) 05/03/2021   CHOLHDL 3.7 05/03/2021    Lab Results  Component Value Date   HGBA1C 5.7 (H) 03/16/2021    Assessment & Plan    1. CAD: S/p CABG x3 (LIMA-LAD, SVG-PDA, SVG-Diagonal) on 03/17/2021. Stable with no anginal symptoms.  He has been on aspirin 325 mg daily surgery.  Discussed with  Dr. Claiborne Billings, will decrease aspirin to 81 mg daily.  Discussed with Dr. Claiborne Billings clearance for DOT physical, patient may drive without restriction, note provided in office today.  Will fax clearance note to requesting party as well.  Continue aspirin, carvedilol as below, amlodipine, and Crestor.   2. Hypertension: He has noted mildly elevated BP at home.  Will switch metoprolol to carvedilol 6.25 mg twice daily.  Continue to monitor BP and report BP consistently greater than 130/80.  Otherwise, continue current antihypertensive regimen.   3. Hyperlipidemia: LDL was 41 in April 2023.  Continue aspirin, Crestor.  4. Carotid artery stenosis: Pre-CABG Dopplers showed 1-39% B ICA stenosis. Asymptomatic.  No indication for repeat study at this time.  Continue aspirin, Crestor.  5. Disposition: F/u in 6 months.      Lenna Sciara, NP 01/13/2022, 8:57 AM

## 2022-01-13 NOTE — Patient Instructions (Addendum)
Medication Instructions:  STOP Asprin 325 mg and Start Asprin 81 mg  STOP Metoprolol as directed  Start Carvedilol 6.25 mg twice daily  *If you need a refill on your cardiac medications before your next appointment, please call your pharmacy*   Lab Work: NONE ordered at this time of appointment   If you have labs (blood work) drawn today and your tests are completely normal, you will receive your results only by: [place] (if you have MyChart) OR A paper copy in the mail If you have any lab test that is abnormal or we need to change your treatment, we will call you to review the results.   Testing/Procedures: NONE ordered at this time of appointment     Follow-Up: At [facility], you and your health needs are our priority.  As part of our continuing mission to provide you with exceptional heart care, we have created designated Provider Care Teams.  These Care Teams include your primary Cardiologist (physician) and Advanced Practice Providers (APPs -  Physician Assistants and Nurse Practitioners) who all work together to provide you with the care you need, when you need it.  We recommend signing up for the patient portal called "MyChart".  Sign up information is provided on this After Visit Summary.  MyChart is used to connect with patients for Virtual Visits (Telemedicine).  Patients are able to view lab/test results, encounter notes, upcoming appointments, etc.  Non-urgent messages can be sent to your provider as well.   To learn more about what you can do with MyChart, go to [email or web address].    Your next appointment:   6 month(s)  The format for your next appointment:   In Person  Provider:   Shelva Majestic, MD     Other Instructions Blood pressure goal is 130/80  Important Information About Sugar

## 2022-01-16 ENCOUNTER — Other Ambulatory Visit: Payer: Self-pay | Admitting: Family Medicine

## 2022-01-17 NOTE — Telephone Encounter (Signed)
Patient returned call. He stated he is not limiting sodium in diet. Reviewed dietary measures to decrease Na+. Education on how to check BP and to do it 2 hours after carvedilol. He will keep a BP diary. He stated he will send BP tomorrow or call with results.

## 2022-01-18 MED ORDER — CARVEDILOL 12.5 MG PO TABS: 12.5000 mg | ORAL_TABLET | Freq: Two times a day (BID) | ORAL | 3 refills | Status: DC

## 2022-01-18 NOTE — Telephone Encounter (Signed)
Patient informed that E. Monge, NP has increased his carvedilol to 12.'5mg'$  twice daily. Patient will continue to use his current prescription of 6.'25mg'$  (2 tablets) twice daily until finished. New prescription for carvedilol 12.5 mg twice daily sent to patient's pharmacy.

## 2022-02-04 NOTE — Telephone Encounter (Signed)
Was this taken care of?

## 2022-02-06 ENCOUNTER — Other Ambulatory Visit: Payer: Self-pay | Admitting: Family Medicine

## 2022-02-07 ENCOUNTER — Other Ambulatory Visit: Payer: Self-pay | Admitting: Physician Assistant

## 2022-02-07 ENCOUNTER — Other Ambulatory Visit: Payer: Self-pay | Admitting: Nurse Practitioner

## 2022-02-15 ENCOUNTER — Telehealth: Payer: Self-pay

## 2022-02-15 DIAGNOSIS — E7849 Other hyperlipidemia: Secondary | ICD-10-CM

## 2022-02-15 DIAGNOSIS — Z8739 Personal history of other diseases of the musculoskeletal system and connective tissue: Secondary | ICD-10-CM

## 2022-02-15 DIAGNOSIS — D649 Anemia, unspecified: Secondary | ICD-10-CM

## 2022-02-15 DIAGNOSIS — Z125 Encounter for screening for malignant neoplasm of prostate: Secondary | ICD-10-CM

## 2022-02-15 NOTE — Telephone Encounter (Signed)
Pt has appt with Dr Nicki Reaper on 01/29 need blood work before appt spoke to Dr Nicki Reaper for [place] to make appt and let him know when to order blood work   Pt call back [phone]

## 2022-02-15 NOTE — Telephone Encounter (Signed)
Last labs 05/03/21: Lipid and Met 7

## 2022-02-16 NOTE — Telephone Encounter (Signed)
Lab orders placed and pt is aware 

## 2022-02-16 NOTE — Telephone Encounter (Signed)
PSA, CMP with estimated GFR, lipid, uric acid, CBC Normochromic anemia, coronary artery disease, hyperlipidemia, history of gout, screening prostate cancer

## 2022-02-16 NOTE — Addendum Note (Signed)
Addended by: Vicente Males on: 02/16/2022 10:15 AM   Modules accepted: Orders

## 2022-02-18 ENCOUNTER — Other Ambulatory Visit: Payer: Self-pay

## 2022-02-18 DIAGNOSIS — Z79899 Other long term (current) drug therapy: Secondary | ICD-10-CM

## 2022-02-18 MED ORDER — HYDROCHLOROTHIAZIDE 12.5 MG PO CAPS: 12.5000 mg | ORAL_CAPSULE | Freq: Every day | ORAL | 3 refills | Status: DC

## 2022-02-26 LAB — CMP14+EGFR
ALT: 31 IU/L (ref 0–44)
AST: 21 IU/L (ref 0–40)
Albumin/Globulin Ratio: 1.5 (ref 1.2–2.2)
Albumin: 4.2 g/dL (ref 3.8–4.9)
Alkaline Phosphatase: 81 IU/L (ref 44–121)
BUN/Creatinine Ratio: 11 (ref 9–20)
BUN: 10 mg/dL (ref 6–24)
Bilirubin Total: <0.2 mg/dL (ref 0.0–1.2)
CO2: 24 mmol/L (ref 20–29)
Calcium: 9.6 mg/dL (ref 8.7–10.2)
Chloride: 99 mmol/L (ref 96–106)
Creatinine, Ser: 0.94 mg/dL (ref 0.76–1.27)
Globulin, Total: 2.8 g/dL (ref 1.5–4.5)
Glucose: 131 mg/dL — ABNORMAL HIGH (ref 70–99)
Potassium: 3.6 mmol/L (ref 3.5–5.2)
Sodium: 137 mmol/L (ref 134–144)
Total Protein: 7 g/dL (ref 6.0–8.5)
eGFR: 96 mL/min/{1.73_m2} (ref >59)

## 2022-02-26 LAB — LIPID PANEL
Chol/HDL Ratio: 4 ratio (ref 0.0–5.0)
Cholesterol, Total: 97 mg/dL — ABNORMAL LOW (ref 100–199)
HDL: 24 mg/dL — ABNORMAL LOW (ref >39)
LDL Chol Calc (NIH): 31 mg/dL (ref 0–99)
Triglycerides: 280 mg/dL — ABNORMAL HIGH (ref 0–149)
VLDL Cholesterol Cal: 42 mg/dL — ABNORMAL HIGH (ref 5–40)

## 2022-02-26 LAB — CBC WITH DIFFERENTIAL/PLATELET
Basophils Absolute: 0.1 10*3/uL (ref 0.0–0.2)
Basos: 1 %
EOS (ABSOLUTE): 0.4 10*3/uL (ref 0.0–0.4)
Eos: 5 %
Hematocrit: 42 % (ref 37.5–51.0)
Hemoglobin: 14.3 g/dL (ref 13.0–17.7)
Immature Grans (Abs): 0 10*3/uL (ref 0.0–0.1)
Immature Granulocytes: 0 %
Lymphocytes Absolute: 3.1 10*3/uL (ref 0.7–3.1)
Lymphs: 36 %
MCH: 29.2 pg (ref 26.6–33.0)
MCHC: 34 g/dL (ref 31.5–35.7)
MCV: 86 fL (ref 79–97)
Monocytes Absolute: 0.8 10*3/uL (ref 0.1–0.9)
Monocytes: 9 %
Neutrophils Absolute: 4.3 10*3/uL (ref 1.4–7.0)
Neutrophils: 49 %
Platelets: 225 10*3/uL (ref 150–450)
RBC: 4.9 x10E6/uL (ref 4.14–5.80)
RDW: 13.8 % (ref 11.6–15.4)
WBC: 8.7 10*3/uL (ref 3.4–10.8)

## 2022-02-26 LAB — PSA: Prostate Specific Ag, Serum: 0.7 ng/mL (ref 0.0–4.0)

## 2022-02-26 LAB — URIC ACID: Uric Acid: 4.9 mg/dL (ref 3.8–8.4)

## 2022-02-28 ENCOUNTER — Ambulatory Visit: Payer: Commercial Managed Care - PPO | Admitting: Family Medicine

## 2022-02-28 VITALS — BP 124/78 | Wt 215.0 lb

## 2022-02-28 DIAGNOSIS — Z87891 Personal history of nicotine dependence: Secondary | ICD-10-CM

## 2022-02-28 DIAGNOSIS — I10 Essential (primary) hypertension: Secondary | ICD-10-CM

## 2022-02-28 DIAGNOSIS — Z125 Encounter for screening for malignant neoplasm of prostate: Secondary | ICD-10-CM

## 2022-02-28 DIAGNOSIS — E7849 Other hyperlipidemia: Secondary | ICD-10-CM | POA: Diagnosis not present

## 2022-02-28 DIAGNOSIS — R7301 Impaired fasting glucose: Secondary | ICD-10-CM

## 2022-02-28 DIAGNOSIS — L4050 Arthropathic psoriasis, unspecified: Secondary | ICD-10-CM

## 2022-02-28 LAB — SPECIMEN STATUS REPORT

## 2022-02-28 MED ORDER — AZELASTINE-FLUTICASONE 137-50 MCG/ACT NA SUSP: 1.0000 | Freq: Two times a day (BID) | NASAL | 5 refills | Status: DC

## 2022-02-28 NOTE — Progress Notes (Signed)
   Subjective:    Patient ID: Brian Brewer, male    DOB: 04/30/1965, 57 y.o.   MRN: [id number]  HPI  Patient arrives today for follow up on blood work. Patient states BP have elevated in the 140s. Pt sates no concerns or issues today.124/78 Patient with underlying history of heart disease Still smokes and knows he needs to quit Blood pressures used takes medicine regular basis Recently thought he was told to stop carvedilol Was placed on HCTZ    Review of Systems     Objective:   Physical Exam General-in no acute distress Eyes-no discharge Lungs-respiratory rate normal, CTA CV-no murmurs,RRR Extremities skin warm dry no edema Neuro grossly normal Behavior normal, alert        Assessment & Plan:   1. Other hyperlipidemia Keep LDL below 70 if possible below 50 recent lab work looked good  2. HTN (hypertension), benign Blood pressure good control on HCTZ Currently not taking carvedilol Will touch base with cardiology   3. Screening for prostate cancer Very good numbers look.  4. Hx of smoking Patient staying away from smoking  5. Fasting hyperglycemia Concerning for the possibility of diabetes adding A1c to recent lab work  6. Psoriatic arthritis ([place]) Under the care of specialist  CT scanning for lung cancer discussed patient to consider

## 2022-03-01 ENCOUNTER — Other Ambulatory Visit: Payer: Self-pay

## 2022-03-01 MED ORDER — METOPROLOL TARTRATE 25 MG PO TABS: 12.5000 mg | ORAL_TABLET | Freq: Two times a day (BID) | ORAL | 3 refills | Status: DC

## 2022-03-01 NOTE — Progress Notes (Signed)
Noted. Spoke with pt. Pt confirmed that he isn't taking Carvedilol. Pt will restart Metoprolol Tartrate 12.5 mg twice daily as directed. Pt voiced understand. Mediation was called into pts pharmacy.

## 2022-03-02 LAB — HGB A1C W/O EAG: Hgb A1c MFr Bld: 6.3 % — ABNORMAL HIGH (ref 4.8–5.6)

## 2022-03-02 LAB — SPECIMEN STATUS REPORT

## 2022-03-03 ENCOUNTER — Telehealth: Payer: Self-pay | Admitting: Family Medicine

## 2022-03-03 NOTE — Telephone Encounter (Signed)
Patient advised per Dr Nicki Reaper:   I did communicate with cardiology They do want him to be on beta-blocker but they recommend currently metoprolol 25 mg 1/2 tablet twice daily They were sending this in Also important for him to continue the other medicines that he is on   Patient verbalized understanding and stated he has already started the medication

## 2022-03-03 NOTE — Telephone Encounter (Signed)
Nurses Please touch base with patient I did communicate with cardiology They do want him to be on beta-blocker but they recommend currently metoprolol 25 mg 1/2 tablet twice daily They were sending this in Also important for him to continue the other medicines that he is on  In addition to this please see lab results-please discuss the results with him-we recommend healthy diet

## 2022-03-05 ENCOUNTER — Encounter: Payer: Self-pay | Admitting: Family Medicine

## 2022-04-04 ENCOUNTER — Other Ambulatory Visit: Payer: Self-pay | Admitting: Family Medicine

## 2022-06-21 ENCOUNTER — Ambulatory Visit: Payer: Commercial Managed Care - PPO | Attending: Cardiovascular Disease | Admitting: Cardiovascular Disease

## 2022-06-21 ENCOUNTER — Encounter: Payer: Self-pay | Admitting: Cardiovascular Disease

## 2022-06-21 VITALS — BP 128/86 | HR 63 | Ht 70.0 in | Wt 206.0 lb

## 2022-06-21 DIAGNOSIS — L4050 Arthropathic psoriasis, unspecified: Secondary | ICD-10-CM

## 2022-06-21 DIAGNOSIS — Z951 Presence of aortocoronary bypass graft: Secondary | ICD-10-CM

## 2022-06-21 DIAGNOSIS — I2510 Atherosclerotic heart disease of native coronary artery without angina pectoris: Secondary | ICD-10-CM | POA: Diagnosis not present

## 2022-06-21 DIAGNOSIS — I6523 Occlusion and stenosis of bilateral carotid arteries: Secondary | ICD-10-CM

## 2022-06-21 DIAGNOSIS — K219 Gastro-esophageal reflux disease without esophagitis: Secondary | ICD-10-CM

## 2022-06-21 DIAGNOSIS — E785 Hyperlipidemia, unspecified: Secondary | ICD-10-CM | POA: Diagnosis not present

## 2022-06-21 DIAGNOSIS — I10 Essential (primary) hypertension: Secondary | ICD-10-CM

## 2022-06-21 NOTE — Patient Instructions (Signed)
Medication Instructions:  Your physician recommends that you continue on your current medications as directed. Please refer to the Current Medication list given to you today.  *If you need a refill on your cardiac medications before your next appointment, please call your pharmacy*   Lab Work: None   Testing/Procedures: None   Follow-Up: At [facility], you and your health needs are our priority.  As part of our continuing mission to provide you with exceptional heart care, we have created designated Provider Care Teams.  These Care Teams include your primary Cardiologist (physician) and Advanced Practice Providers (APPs -  Physician Assistants and Nurse Practitioners) who all work together to provide you with the care you need, when you need it.  Your next appointment:   6 month(s)  Provider:   Bernadene Person, NP    Then, Nicki [place], MD will plan to see you again in 1 year(s).

## 2022-06-21 NOTE — Progress Notes (Signed)
Cardiology Office Note    Date:  06/28/2022   ID:  Brian, Brewer 10-18-1965, MRN [id number]  PCP:  Babs Sciara, MD  Cardiologist:  Nicki Guadalajara, MD   Initial cardiology office visit with me following hospitalization in February 2023.   History of Present Illness:  Brian Brewer is a 57 y.o. male who has history of CAD, in February 2023 and underwent CABG revascularization surgery.  He has a history of carotid artery disease, hypertension, hyperlipidemia, gout, psoriatic arthritis and previous extensive tobacco use.  I saw him during his February 2023 hospitalization.  I have not seen him since.  He presents to the office today for cardiology follow-up evaluation.  Brian Brewer presented to [facility] on March 15, 2021.  He had a several week history of exertionally precipitated chest pressure and strong family history for premature CAD with his father suffering an MI in his 55s.  He had an extensive tobacco history of over 40 years having started smoking at age 68 and quit at age 61 during his hospitalization.  He underwent catheterization by Dr. Katrinka Blazing on March 16, 2021 and ultimately underwent CABG revascularization surgery with a LIMA to LAD, SVG to PDA, SVG to diagonal vessel on March 17, 2021 by Dr. Donata Clay.  Postop course was uncomplicated.  He has subsequently been evaluated by Bernadene Person, PAC on multiple occasions on April 12, 2020, July 13, 2021, and most recently on March 16, 2021.  Clinically he has remained stable and has been on a medical regimen consisting amlodipine 5 mg, HCTZ 12.5 mg, metoprolol tartrate 12.5 mg twice a day, as well as rosuvastatin 40 mg daily.  He has a history of psoriatic arthritis, followed by Dr. Dierdre Forth and is on [place].  He also takes allopurinol on a daily basis.  Presently, he denies any recurrent chest pain or shortness of breath.  He quit cigarette smoking following his catheterization and bypass surgery.  However he  does occasionally use nicotine pouches.  He sees Dr. Lilyan Punt for primary care and is scheduled to see him tomorrow for follow-up evaluation.  He presents for his initial posthospitalization office visit with me 15 months following his hospitalization.   Past Medical History:  Diagnosis Date   Cancer (HCC)    melanoma on the back was removed 10/2018   GERD (gastroesophageal reflux disease)    Gout    Heart attack (HCC)    Hyperlipemia    Hypertension    Psoriatic arthritis (HCC) 12/11/2013   Dr Dierdre Forth     Past Surgical History:  Procedure Laterality Date   BACK SURGERY  2003   L4,5,S1 Fusion   CHOLECYSTECTOMY     COLONOSCOPY N/A 12/29/2016   Procedure: COLONOSCOPY;  Surgeon: Corbin Ade, MD;  Location: [facility] SUITE;  Service: Endoscopy;  Laterality: N/A;  9:15 AM   CORONARY ARTERY BYPASS GRAFT N/A 03/17/2021   Procedure: CORONARY ARTERY BYPASS GRAFTING (CABG) X THREE USING RIGHT GREATER SAPHENOUS VEIN HARVESTED ENDOSCOPICALLY AND LEFT INTERNAL MAMMARY ARTERY.;  Surgeon: Lovett Sox, MD;  Location: [facility] OR;  Service: Open Heart Surgery;  Laterality: N/A;   LAPAROSCOPIC APPENDECTOMY N/A 06/23/2022   Procedure: APPENDECTOMY LAPAROSCOPIC;  Surgeon: Fritzi Mandes, MD;  Location: [facility] OR;  Service: General;  Laterality: N/A;   LEFT HEART CATH AND CORONARY ANGIOGRAPHY N/A 03/16/2021   Procedure: LEFT HEART CATH AND CORONARY ANGIOGRAPHY;  Surgeon: Lyn Records, MD;  Location: [facility] INVASIVE CV  LAB;  Service: Cardiovascular;  Laterality: N/A;   LUMBAR FUSION     [place] yrs ago-[place]   TEE WITHOUT CARDIOVERSION N/A 03/17/2021   Procedure: TRANSESOPHAGEAL ECHOCARDIOGRAM (TEE);  Surgeon: Lovett Sox, MD;  Location: [facility] OR;  Service: Open Heart Surgery;  Laterality: N/A;   TOTAL HIP ARTHROPLASTY Right 02/15/2019   Procedure: RIGHT TOTAL HIP ARTHROPLASTY ANTERIOR APPROACH;  Surgeon: Kathryne Hitch, MD;  Location: [facility] ORS;  Service: Orthopedics;  Laterality: Right;    Current  Medications: Outpatient Medications Prior to Visit  Medication Sig Dispense Refill   allopurinol (ZYLOPRIM) 100 MG tablet TAKE 1 TABLET DAILY 90 tablet 1   amLODipine (NORVASC) 5 MG tablet TAKE 1 TABLET (5 MG TOTAL) BY MOUTH DAILY. 30 tablet 5   aspirin EC 81 MG tablet Take 81 mg by mouth daily. Swallow whole.     metoprolol tartrate (LOPRESSOR) 25 MG tablet Take 0.5 tablets (12.5 mg total) by mouth 2 (two) times daily. (Patient taking differently: Take 25 mg by mouth 2 (two) times daily.) 90 tablet 3   ondansetron (ZOFRAN) 4 MG tablet Take 4 mg by mouth 2 (two) times daily.     OTEZLA 30 MG TABS Take 1 tablet by mouth 2 (two) times daily.     rosuvastatin (CRESTOR) 40 MG tablet Take 40 mg by mouth daily.     Azelastine-Fluticasone 137-50 MCG/ACT SUSP Place 1 spray into the nose every 12 (twelve) hours. (Patient not taking: Reported on 06/22/2022) 23 g 5   esomeprazole (NEXIUM) 40 MG capsule TAKE 1 CAPSULE BY MOUTH EVERY DAY (Patient taking differently: Take 40 mg by mouth daily.) 90 capsule 1   hydrochlorothiazide (MICROZIDE) 12.5 MG capsule Take 1 capsule (12.5 mg total) by mouth daily. 90 capsule 3   No facility-administered medications prior to visit.     Allergies:   Amoxicillin-pot clavulanate, Levofloxacin, Zithromax [azithromycin], Chantix [varenicline tartrate], and Oxycodone   Social History   Socioeconomic History   Marital status: Married    Spouse name: Not on file   Number of children: Not on file   Years of education: Not on file   Highest education level: Not on file  Occupational History   Not on file  Tobacco Use   Smoking status: Former    Packs/day: 0.50    Years: 20.00    Additional pack years: 0.00    Total pack years: 10.00    Types: Cigarettes    Quit date: 03/15/2021    Years since quitting: 1.2   Smokeless tobacco: Never  Vaping Use   Vaping Use: Never used  Substance and Sexual Activity   Alcohol use: No   Drug use: No   Sexual activity: Yes     Birth control/protection: None  Other Topics Concern   Not on file  Social History Narrative   Not on file   Social Determinants of Health   Financial Resource Strain: Not on file  Food Insecurity: No Food Insecurity (06/24/2022)   Hunger Vital Sign    Worried About Running Out of Food in the Last Year: Never true    Ran Out of Food in the Last Year: Never true  Transportation Needs: No Transportation Needs (06/24/2022)   PRAPARE - Administrator, Civil Service (Medical): No    Lack of Transportation (Non-Medical): No  Physical Activity: Not on file  Stress: Not on file  Social Connections: Not on file    Socially he is married.  He is a Merchandiser, retail  at [employer].  He started smoking at age 57 and quit at age 40.  Family History:  The patient's family history includes Heart disease in his father; Hypertension in his father; Leukemia in his father.   ROS General: Negative; No fevers, chills, or night sweats;  HEENT: Negative; No changes in vision or hearing, sinus congestion, difficulty swallowing Pulmonary: Negative; No cough, wheezing, shortness of breath, hemoptysis Cardiovascular: See HPI GI: Negative; No nausea, vomiting, diarrhea, or abdominal pain GU: Negative; No dysuria, hematuria, or difficulty voiding Musculoskeletal: Negative; no myalgias, joint pain, or weakness Hematologic/Oncology: Negative; no easy bruising, bleeding Endocrine: Negative; no heat/cold intolerance; no diabetes Neuro: Negative; no changes in balance, headaches Skin: Negative; No rashes or skin lesions Psychiatric: Negative; No behavioral problems, depression Sleep: Negative; No snoring, daytime sleepiness, hypersomnolence, bruxism, restless legs, hypnogognic hallucinations, no cataplexy Other comprehensive 14 point system review is negative.   PHYSICAL EXAM:   VS:  BP 128/86   Pulse 63   Ht 5\' 10"  (1.778 m)   Wt 206 lb (93.4 kg)   SpO2 93%   BMI 29.56 kg/m     Repeat blood  pressure by me was 120/64.  Wt Readings from Last 3 Encounters:  06/24/22 205 lb 0.4 oz (93 kg)  06/22/22 204 lb 9.6 oz (92.8 kg)  06/21/22 206 lb (93.4 kg)    General: Alert, oriented, no distress.  Skin: normal turgor, no rashes, warm and dry HEENT: Normocephalic, atraumatic. Pupils equal round and reactive to light; sclera anicteric; extraocular muscles intact;  Nose without nasal septal hypertrophy Mouth/Parynx benign; Mallinpatti scale 3 Neck: No JVD, no carotid bruits; normal carotid upstroke Lungs: clear to ausculatation and percussion; no wheezing or rales Chest wall: without tenderness to palpitation Heart: PMI not displaced, RRR, s1 s2 normal, 1/6 systolic murmur, no diastolic murmur, no rubs, gallops, thrills, or heaves Abdomen: soft, nontender; no hepatosplenomehaly, BS+; abdominal aorta nontender and not dilated by palpation. Back: no CVA tenderness Pulses 2+ Musculoskeletal: full range of motion, normal strength, no joint deformities Extremities: no clubbing cyanosis or edema, Homan's sign negative  Neurologic: grossly nonfocal; Cranial nerves grossly wnl Psychologic: Normal mood and affect   Studies/Labs Reviewed:   Jun 21, 2022 ECG (independently read by me): NSR at 63  Recent Labs:    Latest Ref Rng & Units 06/24/2022    2:01 AM 06/23/2022    5:20 PM 06/22/2022    4:40 PM  BMP  Glucose 70 - 99 mg/dL  91  87   BUN 6 - 20 mg/dL  16  18   Creatinine 1.61 - 1.24 mg/dL 0.96  0.45  4.09   BUN/Creat Ratio 9 - 20   15   Sodium 135 - 145 mmol/L  137  139   Potassium 3.5 - 5.1 mmol/L  3.8  3.9   Chloride 98 - 111 mmol/L  105  100   CO2 22 - 32 mmol/L  23  24   Calcium 8.9 - 10.3 mg/dL  9.3  9.6         Latest Ref Rng & Units 06/23/2022    5:20 PM 06/22/2022    4:40 PM 02/25/2022    4:04 PM  Hepatic Function  Total Protein 6.5 - 8.1 g/dL 7.1  7.2  7.0   Albumin 3.5 - 5.0 g/dL 3.8  4.4  4.2   AST 15 - 41 U/L 21  17  21    ALT 0 - 44 U/L 23  16  31  Alk  Phosphatase 38 - 126 U/L 59  72  81   Total Bilirubin 0.3 - 1.2 mg/dL 0.5  0.3  <1.6   Bilirubin, Direct 0.00 - 0.40 mg/dL  <1.09         Latest Ref Rng & Units 06/24/2022    2:01 AM 06/23/2022    5:20 PM 06/22/2022    4:40 PM  CBC  WBC 4.0 - 10.5 K/uL 10.1  8.2  8.5   Hemoglobin 13.0 - 17.0 g/dL 60.4  54.0  98.1   Hematocrit 39.0 - 52.0 % 39.8  41.7  42.0   Platelets 150 - 400 K/uL 211  250  216    Lab Results  Component Value Date   MCV 84.7 06/24/2022   MCV 83.4 06/23/2022   MCV 85 06/22/2022   Lab Results  Component Value Date   TSH 1.754 03/17/2021   Lab Results  Component Value Date   HGBA1C 6.3 (H) 02/25/2022     BNP No results found for: "BNP"  ProBNP No results found for: "PROBNP"   Lipid Panel     Component Value Date/Time   CHOL 97 (L) 02/25/2022 1604   TRIG 280 (H) 02/25/2022 1604   HDL 24 (L) 02/25/2022 1604   CHOLHDL 4.0 02/25/2022 1604   CHOLHDL 3.9 03/17/2021 0537   VLDL 37 03/17/2021 0537   LDLCALC 31 02/25/2022 1604   LABVLDL 42 (H) 02/25/2022 1604     RADIOLOGY: CT ABDOMEN PELVIS W CONTRAST  Result Date: 06/23/2022 CLINICAL DATA:  Abdominal pain, elevated CRP EXAM: CT ABDOMEN AND PELVIS WITH CONTRAST TECHNIQUE: Multidetector CT imaging of the abdomen and pelvis was performed using the standard protocol following bolus administration of intravenous contrast. RADIATION DOSE REDUCTION: This exam was performed according to the departmental dose-optimization program which includes automated exposure control, adjustment of the mA and/or kV according to patient size and/or use of iterative reconstruction technique. CONTRAST:  OMNIPAQUE IOHEXOL 300 MG/ML  SOLN COMPARISON:  CT abdomen dated 10/21/2011 FINDINGS: Lower chest: Lung bases are clear. Hepatobiliary: Liver is within normal limits. Status post cholecystectomy. No intrahepatic or extrahepatic duct dilatation. Pancreas: Within normal limits. Spleen: Within normal limits. Adrenals/Urinary  Tract: 18 mm left adrenal nodule, measuring 10 HUs on prior unenhanced CT, compatible with a benign adrenal adenoma. No follow-up is recommended. Right adrenal glands are within normal limits. Kidneys are within normal limits.  No hydronephrosis. Bladder is obscured by streak artifact but grossly unremarkable. Stomach/Bowel: Stomach is within normal limits. No evidence of bowel obstruction. Dilated mid appendix, measuring 14 mm, with mild periappendiceal stranding (series 2/image 87), suggesting mild acute appendicitis. No drainable fluid collection/abscess. No free air to suggest macroscopic perforation. No colonic wall thickening or inflammatory changes. Vascular/Lymphatic: No evidence of abdominal aortic aneurysm. Atherosclerotic calcifications of the abdominal aorta and branch vessels. No suspicious abdominopelvic lymphadenopathy. Reproductive: Prostate is notable for dystrophic calcifications. Other: No abdominopelvic ascites. Musculoskeletal: Lumbar spine fixation hardware at L3-S1. Median sternotomy. IMPRESSION: Mild acute appendicitis, without evidence of complication. Additional ancillary findings as above. Electronically Signed   By: Charline Bills M.D.   On: 06/23/2022 13:12     Additional studies/ records that were reviewed today include:   CARDIAC CATH: 03/16/2021  CONCLUSIONS: Ostial to proximal LAD 95% stenosis before the origin of diagonal #1.  Chronic total occlusion of the LAD after the first diagonal.  First diagonal contains segmental 50% narrowing.   Widely patent left main. 40 to 50% second obtuse marginal. Dominant RCA with  60-70 % eccentric proximal stenosis.  The vessel is dominant and supplies right to left collaterals to LAD.  The LAD appears to be a large target that is graftable. RECOMMENDATIONS: Revascularization is needed.  Best long-term approach would be LIMA to LAD and saphenous vein graft to diagonal plus minus RCA.  There could potentially be a percutaneous approach  although it would include a chronic total occlusion bifurcation stenosis in the proximal to mid LAD. Recommend surgical evaluation and interventional opinions from team.    ASSESSMENT:    1. Essential hypertension   2. Coronary artery disease involving native coronary artery of native heart without angina pectoris   3. S/P CABG x 3   4. Hyperlipidemia LDL goal <70   5. Bilateral carotid artery stenosis   6. Psoriatic arthritis (HCC)   7. Gastroesophageal reflux disease without esophagitis     PLAN:  Brian Brewer is a 57 year old gentleman who had approximately 40-year history of tobacco use and presented to [facility] in February 2023 with several week history of exertional chest pain.  He went in for a non-STEMI and was found to have severe multivessel CAD.  He underwent successful CABG revascularization surgery by Dr. Maren Beach on March 17, 2021 with LIMA to LAD, SVG to PDA, and SVG to diagonal vessel.  Echo Doppler study prior to surgery showed EF 55 to 60% with moderate concentric LVH, mild MR.  Pre-CABG carotid Doppler showed 1 to 39% bilateral ICA stenoses.  Subsequently, he has done exceptionally well.  He quit smoking cigarettes, but occasionally uses nicotine pouches.  He has been seen by Bernadene Person, NP in our office subsequent to his surgery and has remained stable.  Presently, he is asymptomatic without chest pain or shortness of breath.  He continues to work as a Merchandiser, retail for [employer].  He went back to work 2 weeks after his surgery.  His blood pressure today is stable on his current regimen of amlodipine 5 mg, HCTZ 12.5 mg and metoprolol to tartrate 12.5 mg twice a day.  He is on rosuvastatin 40 mg for hyperlipidemia.  He sees Designer[employer]ell[employer] for primary care who checks laboratory.  He has psoriatic arthritis, followed by Dr. Dierdre Forth and is on [place].  He has GERD for which he takes Nexium.,  He is doing well now 15 months status post his presentation with  ultimate CABG revascularization surgery.  I have recommended that in 6 months CC Bernadene Person, NP for follow-up evaluation and I will see him in May 2025 for a yearly assessment.  Medication Adjustments/Labs and Tests Ordered: Current medicines are reviewed at length with the patient today.  Concerns regarding medicines are outlined above.  Medication changes, Labs and Tests ordered today are listed in the Patient Instructions below. Patient Instructions  Medication Instructions:  Your physician recommends that you continue on your current medications as directed. Please refer to the Current Medication list given to you today.  *If you need a refill on your cardiac medications before your next appointment, please call your pharmacy*   Lab Work: None   Testing/Procedures: None   Follow-Up: At [facility], you and your health needs are our priority.  As part of our continuing mission to provide you with exceptional heart care, we have created designated Provider Care Teams.  These Care Teams include your primary Cardiologist (physician) and Advanced Practice Providers (APPs -  Physician Assistants and Nurse Practitioners) who all work together to provide you with the care  you need, when you need it.  Your next appointment:   6 month(s)  Provider:   Bernadene Person, NP    Then, Nicki Guadalajara, MD will plan to see you again in 1 year(s).     Signed, Nicki Guadalajara, MD  06/28/2022 3:03 PM    [facility] [address], [place], [place]  [address] Phone: [phone]

## 2022-06-22 ENCOUNTER — Encounter: Payer: Self-pay | Admitting: Family Medicine

## 2022-06-22 ENCOUNTER — Ambulatory Visit (INDEPENDENT_AMBULATORY_CARE_PROVIDER_SITE_OTHER): Payer: Commercial Managed Care - PPO | Admitting: Family Medicine

## 2022-06-22 VITALS — BP 130/72 | HR 97 | Temp 97.7°F | Ht 70.0 in | Wt 204.6 lb

## 2022-06-22 DIAGNOSIS — Z6830 Body mass index (BMI) 30.0-30.9, adult: Secondary | ICD-10-CM | POA: Insufficient documentation

## 2022-06-22 DIAGNOSIS — R1033 Periumbilical pain: Secondary | ICD-10-CM

## 2022-06-22 NOTE — Progress Notes (Unsigned)
   Subjective:    Patient ID: Brian Brewer, male    DOB: 1965/07/31, 57 y.o.   MRN: [id number]  HPI Pt c/o abdominal pain x 1 week. Significant abdominal pain near the elbow like this for the past week it has been going on most of the day every single day some days are worse than others does wake him up at night does make it difficult for him to sleep denies high fever chills sweats denies wheezing difficulty breathing states energy level overall okay states foul odor to his bowel movements but no blood in his bowel movements.  Denies dysuria urinary frequency.  No chest pain or shortness of breath Surgically he is already had a cholecystectomy Review of Systems     Objective:   Physical Exam General-in no acute distress Eyes-no discharge Lungs-respiratory rate normal, CTA CV-no murmurs,RRR Extremities skin warm dry no edema Neuro grossly normal Behavior normal, alert On abdominal exam I do not find any evidence of a hernia but he does have tenderness just above the umbilicus and near the umbilicus no guarding or rebound but it is significantly tender is not in the epigastrium not in the left lower or right lower quadrant       Assessment & Plan:  Given that he has had this ongoing pain for the past week with tenderness and not responding to Tylenol, rest, his use of PPI further testing is necessary Lab work will be ordered first I doubt that this is ischemic issues But there could be colitis versus duodenitis versus pancreas issues-therefore lab work followed by scan if lab work is not totally revealing

## 2022-06-23 ENCOUNTER — Emergency Department (HOSPITAL_BASED_OUTPATIENT_CLINIC_OR_DEPARTMENT_OTHER): Payer: Commercial Managed Care - PPO | Admitting: Anesthesiology

## 2022-06-23 ENCOUNTER — Emergency Department (HOSPITAL_COMMUNITY): Payer: Commercial Managed Care - PPO | Admitting: Anesthesiology

## 2022-06-23 ENCOUNTER — Ambulatory Visit (HOSPITAL_COMMUNITY)
Admission: RE | Admit: 2022-06-23 | Discharge: 2022-06-23 | Disposition: A | Discharge status: Home / Self Care | Payer: Commercial Managed Care - PPO | Source: Ambulatory Visit | Attending: Family Medicine | Admitting: Family Medicine

## 2022-06-23 ENCOUNTER — Telehealth: Payer: Self-pay | Admitting: Family Medicine

## 2022-06-23 ENCOUNTER — Encounter (HOSPITAL_COMMUNITY)
Admission: EM | Disposition: A | Discharge status: Home / Self Care | Payer: Self-pay | Source: Home / Self Care | Attending: Emergency Medicine

## 2022-06-23 ENCOUNTER — Observation Stay (HOSPITAL_COMMUNITY)
Admission: EM | Admit: 2022-06-23 | Discharge: 2022-06-24 | Disposition: A | Discharge status: Home / Self Care | Payer: Commercial Managed Care - PPO | Attending: Surgery | Admitting: Surgery

## 2022-06-23 DIAGNOSIS — K3580 Unspecified acute appendicitis: Secondary | ICD-10-CM

## 2022-06-23 DIAGNOSIS — Z96641 Presence of right artificial hip joint: Secondary | ICD-10-CM | POA: Diagnosis not present

## 2022-06-23 DIAGNOSIS — Z87891 Personal history of nicotine dependence: Secondary | ICD-10-CM

## 2022-06-23 DIAGNOSIS — I10 Essential (primary) hypertension: Secondary | ICD-10-CM

## 2022-06-23 DIAGNOSIS — I2510 Atherosclerotic heart disease of native coronary artery without angina pectoris: Secondary | ICD-10-CM | POA: Diagnosis not present

## 2022-06-23 DIAGNOSIS — Z7982 Long term (current) use of aspirin: Secondary | ICD-10-CM | POA: Diagnosis not present

## 2022-06-23 DIAGNOSIS — R1033 Periumbilical pain: Secondary | ICD-10-CM | POA: Insufficient documentation

## 2022-06-23 DIAGNOSIS — Z79899 Other long term (current) drug therapy: Secondary | ICD-10-CM | POA: Insufficient documentation

## 2022-06-23 DIAGNOSIS — Z85828 Personal history of other malignant neoplasm of skin: Secondary | ICD-10-CM | POA: Insufficient documentation

## 2022-06-23 DIAGNOSIS — R1031 Right lower quadrant pain: Secondary | ICD-10-CM | POA: Diagnosis present

## 2022-06-23 DIAGNOSIS — Z951 Presence of aortocoronary bypass graft: Secondary | ICD-10-CM | POA: Insufficient documentation

## 2022-06-23 DIAGNOSIS — K37 Unspecified appendicitis: Secondary | ICD-10-CM | POA: Diagnosis present

## 2022-06-23 HISTORY — PX: LAPAROSCOPIC APPENDECTOMY: SHX408

## 2022-06-23 LAB — CBC WITH DIFFERENTIAL/PLATELET
Basophils Absolute: 0.1 10*3/uL (ref 0.0–0.2)
Basos: 1 %
EOS (ABSOLUTE): 0.3 10*3/uL (ref 0.0–0.4)
Eos: 3 %
Hematocrit: 42 % (ref 37.5–51.0)
Hemoglobin: 14 g/dL (ref 13.0–17.7)
Immature Grans (Abs): 0 10*3/uL (ref 0.0–0.1)
Immature Granulocytes: 0 %
Lymphocytes Absolute: 2.5 10*3/uL (ref 0.7–3.1)
Lymphs: 30 %
MCH: 28.3 pg (ref 26.6–33.0)
MCHC: 33.3 g/dL (ref 31.5–35.7)
MCV: 85 fL (ref 79–97)
Monocytes Absolute: 0.8 10*3/uL (ref 0.1–0.9)
Monocytes: 10 %
Neutrophils Absolute: 4.7 10*3/uL (ref 1.4–7.0)
Neutrophils: 56 %
Platelets: 216 10*3/uL (ref 150–450)
RBC: 4.94 x10E6/uL (ref 4.14–5.80)
RDW: 13.4 % (ref 11.6–15.4)
WBC: 8.5 10*3/uL (ref 3.4–10.8)

## 2022-06-23 LAB — URINALYSIS, ROUTINE W REFLEX MICROSCOPIC
Bilirubin Urine: NEGATIVE
Glucose, UA: NEGATIVE mg/dL
Hgb urine dipstick: NEGATIVE
Ketones, ur: NEGATIVE mg/dL
Leukocytes,Ua: NEGATIVE
Nitrite: NEGATIVE
Protein, ur: NEGATIVE mg/dL
Specific Gravity, Urine: 1.036 — ABNORMAL HIGH (ref 1.005–1.030)
pH: 6 (ref 5.0–8.0)

## 2022-06-23 LAB — BASIC METABOLIC PANEL
BUN/Creatinine Ratio: 15 (ref 9–20)
BUN: 18 mg/dL (ref 6–24)
CO2: 24 mmol/L (ref 20–29)
Calcium: 9.6 mg/dL (ref 8.7–10.2)
Chloride: 100 mmol/L (ref 96–106)
Creatinine, Ser: 1.2 mg/dL (ref 0.76–1.27)
Glucose: 87 mg/dL (ref 70–99)
Potassium: 3.9 mmol/L (ref 3.5–5.2)
Sodium: 139 mmol/L (ref 134–144)
eGFR: 71 mL/min/{1.73_m2} (ref >59)

## 2022-06-23 LAB — HEPATIC FUNCTION PANEL
ALT: 16 IU/L (ref 0–44)
AST: 17 IU/L (ref 0–40)
Albumin: 4.4 g/dL (ref 3.8–4.9)
Alkaline Phosphatase: 72 IU/L (ref 44–121)
Bilirubin Total: 0.3 mg/dL (ref 0.0–1.2)
Bilirubin, Direct: <0.1 mg/dL (ref 0.00–0.40)
Total Protein: 7.2 g/dL (ref 6.0–8.5)

## 2022-06-23 LAB — COMPREHENSIVE METABOLIC PANEL
ALT: 23 U/L (ref 0–44)
AST: 21 U/L (ref 15–41)
Albumin: 3.8 g/dL (ref 3.5–5.0)
Alkaline Phosphatase: 59 U/L (ref 38–126)
Anion gap: 9 (ref 5–15)
BUN: 16 mg/dL (ref 6–20)
CO2: 23 mmol/L (ref 22–32)
Calcium: 9.3 mg/dL (ref 8.9–10.3)
Chloride: 105 mmol/L (ref 98–111)
Creatinine, Ser: 1.37 mg/dL — ABNORMAL HIGH (ref 0.61–1.24)
GFR, Estimated: >60 mL/min (ref >60)
Glucose, Bld: 91 mg/dL (ref 70–99)
Potassium: 3.8 mmol/L (ref 3.5–5.1)
Sodium: 137 mmol/L (ref 135–145)
Total Bilirubin: 0.5 mg/dL (ref 0.3–1.2)
Total Protein: 7.1 g/dL (ref 6.5–8.1)

## 2022-06-23 LAB — CBC
HCT: 41.7 % (ref 39.0–52.0)
Hemoglobin: 14 g/dL (ref 13.0–17.0)
MCH: 28 pg (ref 26.0–34.0)
MCHC: 33.6 g/dL (ref 30.0–36.0)
MCV: 83.4 fL (ref 80.0–100.0)
Platelets: 250 10*3/uL (ref 150–400)
RBC: 5 MIL/uL (ref 4.22–5.81)
RDW: 13.1 % (ref 11.5–15.5)
WBC: 8.2 10*3/uL (ref 4.0–10.5)
nRBC: 0 % (ref 0.0–0.2)

## 2022-06-23 LAB — C-REACTIVE PROTEIN: CRP: 16 mg/L — ABNORMAL HIGH (ref 0–10)

## 2022-06-23 LAB — LIPASE: Lipase: 37 U/L (ref 13–78)

## 2022-06-23 LAB — LIPASE, BLOOD: Lipase: 61 U/L — ABNORMAL HIGH (ref 11–51)

## 2022-06-23 SURGERY — APPENDECTOMY, LAPAROSCOPIC
Anesthesia: General | Site: Abdomen

## 2022-06-23 MED ORDER — FENTANYL CITRATE (PF) 250 MCG/5ML IJ SOLN
INTRAMUSCULAR | Status: DC | PRN
  Administered 2022-06-23: 150 ug via INTRAVENOUS
  Administered 2022-06-23: 100 ug via INTRAVENOUS

## 2022-06-23 MED ORDER — ENOXAPARIN SODIUM 40 MG/0.4ML IJ SOSY: 40.0000 mg | PREFILLED_SYRINGE | INTRAMUSCULAR | Status: DC

## 2022-06-23 MED ORDER — AMLODIPINE BESYLATE 5 MG PO TABS
5.0000 mg | ORAL_TABLET | Freq: Every day | ORAL | Status: DC
  Administered 2022-06-24: 5 mg via ORAL
  Filled 2022-06-23 (×2): qty 1

## 2022-06-23 MED ORDER — BUPIVACAINE-EPINEPHRINE 0.25% -1:200000 IJ SOLN
INTRAMUSCULAR | Status: DC | PRN
  Administered 2022-06-23: 14 mL

## 2022-06-23 MED ORDER — 0.9 % SODIUM CHLORIDE (POUR BTL) OPTIME
TOPICAL | Status: DC | PRN
  Administered 2022-06-23: 1000 mL

## 2022-06-23 MED ORDER — MIDAZOLAM HCL 5 MG/5ML IJ SOLN
INTRAMUSCULAR | Status: DC | PRN
  Administered 2022-06-23: 2 mg via INTRAVENOUS

## 2022-06-23 MED ORDER — ONDANSETRON HCL 4 MG/2ML IJ SOLN
4.0000 mg | Freq: Once | INTRAMUSCULAR | Status: AC
  Administered 2022-06-23: 4 mg via INTRAVENOUS
  Filled 2022-06-23: qty 2

## 2022-06-23 MED ORDER — SUGAMMADEX SODIUM 200 MG/2ML IV SOLN
INTRAVENOUS | Status: DC | PRN
  Administered 2022-06-23: 200 mg via INTRAVENOUS

## 2022-06-23 MED ORDER — HYDROCHLOROTHIAZIDE 12.5 MG PO TABS
12.5000 mg | ORAL_TABLET | Freq: Every day | ORAL | Status: DC
  Administered 2022-06-24: 12.5 mg via ORAL
  Filled 2022-06-23: qty 1

## 2022-06-23 MED ORDER — ONDANSETRON HCL 4 MG/2ML IJ SOLN
INTRAMUSCULAR | Status: DC | PRN
  Administered 2022-06-23: 4 mg via INTRAVENOUS

## 2022-06-23 MED ORDER — SODIUM CHLORIDE 0.9 % IR SOLN
Status: DC | PRN
  Administered 2022-06-23: 1000 mL

## 2022-06-23 MED ORDER — SODIUM CHLORIDE 0.9 % IV BOLUS
1000.0000 mL | Freq: Once | INTRAVENOUS | Status: AC
  Administered 2022-06-23: 1000 mL via INTRAVENOUS

## 2022-06-23 MED ORDER — CHLORHEXIDINE GLUCONATE CLOTH 2 % EX PADS: 6.0000 | MEDICATED_PAD | Freq: Once | CUTANEOUS | Status: DC

## 2022-06-23 MED ORDER — BUPIVACAINE-EPINEPHRINE (PF) 0.25% -1:200000 IJ SOLN
INTRAMUSCULAR | Status: AC
  Filled 2022-06-23: qty 30

## 2022-06-23 MED ORDER — PROPOFOL 10 MG/ML IV BOLUS
INTRAVENOUS | Status: DC | PRN
  Administered 2022-06-23: 150 mg via INTRAVENOUS

## 2022-06-23 MED ORDER — HYDROMORPHONE HCL 1 MG/ML IJ SOLN: 0.5000 mg | INTRAMUSCULAR | Status: DC | PRN

## 2022-06-23 MED ORDER — LIDOCAINE HCL (CARDIAC) PF 100 MG/5ML IV SOSY
PREFILLED_SYRINGE | INTRAVENOUS | Status: DC | PRN
  Administered 2022-06-23: 80 mg via INTRATRACHEAL

## 2022-06-23 MED ORDER — FENTANYL CITRATE (PF) 250 MCG/5ML IJ SOLN
INTRAMUSCULAR | Status: AC
  Filled 2022-06-23: qty 5

## 2022-06-23 MED ORDER — KETOROLAC TROMETHAMINE 15 MG/ML IJ SOLN
15.0000 mg | Freq: Three times a day (TID) | INTRAMUSCULAR | Status: DC
  Administered 2022-06-24 (×2): 15 mg via INTRAVENOUS
  Filled 2022-06-23 (×2): qty 1

## 2022-06-23 MED ORDER — ROSUVASTATIN CALCIUM 20 MG PO TABS
40.0000 mg | ORAL_TABLET | Freq: Every day | ORAL | Status: DC
  Administered 2022-06-24: 40 mg via ORAL
  Filled 2022-06-23: qty 2

## 2022-06-23 MED ORDER — DIPHENHYDRAMINE HCL 50 MG/ML IJ SOLN: 25.0000 mg | Freq: Four times a day (QID) | INTRAMUSCULAR | Status: DC | PRN

## 2022-06-23 MED ORDER — LACTATED RINGERS IV SOLN: INTRAVENOUS | Status: DC | PRN

## 2022-06-23 MED ORDER — PROPOFOL 10 MG/ML IV BOLUS
INTRAVENOUS | Status: AC
  Filled 2022-06-23: qty 20

## 2022-06-23 MED ORDER — SODIUM CHLORIDE 0.9 % IV SOLN
2.0000 g | Freq: Once | INTRAVENOUS | Status: AC
  Administered 2022-06-23: 2 g via INTRAVENOUS
  Filled 2022-06-23: qty 20

## 2022-06-23 MED ORDER — HYDROCODONE-ACETAMINOPHEN 5-325 MG PO TABS
1.0000 | ORAL_TABLET | Freq: Four times a day (QID) | ORAL | Status: DC | PRN
  Administered 2022-06-24: 2 via ORAL
  Filled 2022-06-23: qty 2

## 2022-06-23 MED ORDER — ROCURONIUM BROMIDE 100 MG/10ML IV SOLN
INTRAVENOUS | Status: DC | PRN
  Administered 2022-06-23: 70 mg via INTRAVENOUS

## 2022-06-23 MED ORDER — FENTANYL CITRATE (PF) 100 MCG/2ML IJ SOLN
25.0000 ug | INTRAMUSCULAR | Status: DC | PRN
  Administered 2022-06-23: 50 ug via INTRAVENOUS

## 2022-06-23 MED ORDER — MORPHINE SULFATE (PF) 4 MG/ML IV SOLN
4.0000 mg | Freq: Once | INTRAVENOUS | Status: AC
  Administered 2022-06-23: 4 mg via INTRAVENOUS
  Filled 2022-06-23: qty 1

## 2022-06-23 MED ORDER — PANTOPRAZOLE SODIUM 40 MG PO TBEC
40.0000 mg | DELAYED_RELEASE_TABLET | Freq: Every day | ORAL | Status: DC
  Administered 2022-06-24: 40 mg via ORAL
  Filled 2022-06-23 (×2): qty 1

## 2022-06-23 MED ORDER — ONDANSETRON 4 MG PO TBDP: 4.0000 mg | ORAL_TABLET | Freq: Four times a day (QID) | ORAL | Status: DC | PRN

## 2022-06-23 MED ORDER — METHOCARBAMOL 500 MG PO TABS: 500.0000 mg | ORAL_TABLET | Freq: Four times a day (QID) | ORAL | Status: DC | PRN

## 2022-06-23 MED ORDER — ACETAMINOPHEN 10 MG/ML IV SOLN: 1000.0000 mg | Freq: Once | INTRAVENOUS | Status: DC | PRN

## 2022-06-23 MED ORDER — MIDAZOLAM HCL 2 MG/2ML IJ SOLN
INTRAMUSCULAR | Status: AC
  Filled 2022-06-23: qty 2

## 2022-06-23 MED ORDER — ALLOPURINOL 100 MG PO TABS
100.0000 mg | ORAL_TABLET | Freq: Every day | ORAL | Status: DC
  Administered 2022-06-24: 100 mg via ORAL
  Filled 2022-06-23 (×2): qty 1

## 2022-06-23 MED ORDER — METOPROLOL TARTRATE 25 MG PO TABS
25.0000 mg | ORAL_TABLET | Freq: Two times a day (BID) | ORAL | Status: DC
  Administered 2022-06-24 (×2): 25 mg via ORAL
  Filled 2022-06-23 (×2): qty 1

## 2022-06-23 MED ORDER — ONDANSETRON HCL 4 MG/2ML IJ SOLN: 4.0000 mg | Freq: Four times a day (QID) | INTRAMUSCULAR | Status: DC | PRN

## 2022-06-23 MED ORDER — DIPHENHYDRAMINE HCL 25 MG PO CAPS: 25.0000 mg | ORAL_CAPSULE | Freq: Four times a day (QID) | ORAL | Status: DC | PRN

## 2022-06-23 MED ORDER — ACETAMINOPHEN 160 MG/5ML PO SOLN: 1000.0000 mg | Freq: Once | ORAL | Status: DC | PRN

## 2022-06-23 MED ORDER — ACETAMINOPHEN 500 MG PO TABS: 1000.0000 mg | ORAL_TABLET | Freq: Once | ORAL | Status: DC | PRN

## 2022-06-23 MED ORDER — FENTANYL CITRATE (PF) 100 MCG/2ML IJ SOLN
INTRAMUSCULAR | Status: AC
  Filled 2022-06-23: qty 2

## 2022-06-23 MED ORDER — METRONIDAZOLE 500 MG/100ML IV SOLN
500.0000 mg | Freq: Once | INTRAVENOUS | Status: AC
  Administered 2022-06-23: 500 mg via INTRAVENOUS
  Filled 2022-06-23: qty 100

## 2022-06-23 MED ORDER — MELATONIN 3 MG PO TABS: 3.0000 mg | ORAL_TABLET | Freq: Every evening | ORAL | Status: DC | PRN

## 2022-06-23 MED ORDER — IOHEXOL 300 MG/ML  SOLN
100.0000 mL | Freq: Once | INTRAMUSCULAR | Status: AC | PRN
  Administered 2022-06-23: 100 mL via INTRAVENOUS

## 2022-06-23 MED ORDER — ASPIRIN 81 MG PO TBEC
81.0000 mg | DELAYED_RELEASE_TABLET | Freq: Every day | ORAL | Status: DC
  Administered 2022-06-24: 81 mg via ORAL
  Filled 2022-06-23 (×2): qty 1

## 2022-06-23 MED ORDER — DOCUSATE SODIUM 100 MG PO CAPS
100.0000 mg | ORAL_CAPSULE | Freq: Two times a day (BID) | ORAL | Status: DC
  Administered 2022-06-24 (×2): 100 mg via ORAL
  Filled 2022-06-23 (×2): qty 1

## 2022-06-23 SURGICAL SUPPLY — 46 items
ADH SKN CLS APL DERMABOND .7 (GAUZE/BANDAGES/DRESSINGS) ×1
APL PRP STRL LF DISP 70% ISPRP (MISCELLANEOUS) ×1
APPLIER CLIP 5 13 M/L LIGAMAX5 (MISCELLANEOUS)
APR CLP MED LRG 5 ANG JAW (MISCELLANEOUS)
BAG COUNTER SPONGE SURGICOUNT (BAG) ×1 IMPLANT
BAG SPNG CNTER NS LX DISP (BAG) ×1
BLADE CLIPPER SURG (BLADE) IMPLANT
CANISTER SUCT 3000ML PPV (MISCELLANEOUS) ×1 IMPLANT
CHLORAPREP W/TINT 26 (MISCELLANEOUS) ×1 IMPLANT
CLIP APPLIE 5 13 M/L LIGAMAX5 (MISCELLANEOUS) IMPLANT
COVER SURGICAL LIGHT HANDLE (MISCELLANEOUS) ×1 IMPLANT
CUTTER FLEX LINEAR 45M (STAPLE) IMPLANT
DERMABOND ADVANCED .7 DNX12 (GAUZE/BANDAGES/DRESSINGS) ×1 IMPLANT
ELECT REM PT RETURN 9FT ADLT (ELECTROSURGICAL) ×1
ELECTRODE REM PT RTRN 9FT ADLT (ELECTROSURGICAL) ×1 IMPLANT
ENDOLOOP SUT PDS II  0 18 (SUTURE)
ENDOLOOP SUT PDS II 0 18 (SUTURE) IMPLANT
GLOVE BIOGEL PI MICRO STRL 5.5 (GLOVE) ×1 IMPLANT
GLOVE SURG UNDER POLY LF SZ6 (GLOVE) ×1 IMPLANT
GOWN STRL REUS W/ TWL LRG LVL3 (GOWN DISPOSABLE) ×3 IMPLANT
GOWN STRL REUS W/TWL LRG LVL3 (GOWN DISPOSABLE) ×3
IRRIG SUCT STRYKERFLOW 2 WTIP (MISCELLANEOUS)
IRRIGATION SUCT STRKRFLW 2 WTP (MISCELLANEOUS) IMPLANT
KIT BASIN OR (CUSTOM PROCEDURE TRAY) ×1 IMPLANT
KIT TURNOVER KIT B (KITS) ×1 IMPLANT
NS IRRIG 1000ML POUR BTL (IV SOLUTION) ×1 IMPLANT
PAD ARMBOARD 7.5X6 YLW CONV (MISCELLANEOUS) ×2 IMPLANT
PENCIL BUTTON HOLSTER BLD 10FT (ELECTRODE) ×1 IMPLANT
RELOAD STAPLE 45 3.5 BLU ETS (ENDOMECHANICALS) IMPLANT
RELOAD STAPLE TA45 3.5 REG BLU (ENDOMECHANICALS) ×1 IMPLANT
SCISSORS LAP 5X35 DISP (ENDOMECHANICALS) IMPLANT
SET TUBE SMOKE EVAC HIGH FLOW (TUBING) ×1 IMPLANT
SHEARS HARMONIC ACE PLUS 36CM (ENDOMECHANICALS) IMPLANT
SLEEVE Z-THREAD 5X100MM (TROCAR) ×1 IMPLANT
SPECIMEN JAR SMALL (MISCELLANEOUS) ×1 IMPLANT
SUT MNCRL AB 4-0 PS2 18 (SUTURE) ×1 IMPLANT
SYS BAG RETRIEVAL 10MM (BASKET) ×1
SYSTEM BAG RETRIEVAL 10MM (BASKET) ×1 IMPLANT
TOWEL GREEN STERILE (TOWEL DISPOSABLE) ×1 IMPLANT
TOWEL GREEN STERILE FF (TOWEL DISPOSABLE) ×1 IMPLANT
TRAY FOLEY W/BAG SLVR 14FR (SET/KITS/TRAYS/PACK) IMPLANT
TRAY LAPAROSCOPIC MC (CUSTOM PROCEDURE TRAY) ×1 IMPLANT
TROCAR BALLN 12MMX100 BLUNT (TROCAR) ×1 IMPLANT
TROCAR Z-THREAD OPTICAL 5X100M (TROCAR) ×1 IMPLANT
WARMER LAPAROSCOPE (MISCELLANEOUS) ×1 IMPLANT
WATER STERILE IRR 1000ML POUR (IV SOLUTION) ×1 IMPLANT

## 2022-06-23 NOTE — Anesthesia Preprocedure Evaluation (Addendum)
Anesthesia Evaluation  Patient identified by MRN, date of birth, ID band Patient awake    Reviewed: Allergy & Precautions, NPO status , Patient's Chart, lab work & pertinent test results  History of Anesthesia Complications Negative for: history of anesthetic complications  Airway Mallampati: II  TM Distance: >3 FB Neck ROM: Full    Dental  (+) Dental Advisory Given, Teeth Intact   Pulmonary neg shortness of breath, neg sleep apnea, neg COPD, neg recent URI, former smoker   breath sounds clear to auscultation       Cardiovascular hypertension, + CAD, + Past MI and + CABG   Rhythm:Regular  1. Left ventricular ejection fraction, by estimation, is 55 to 60%. The  left ventricle has normal function. The left ventricle demonstrates  regional wall motion abnormalities (see scoring diagram/findings for  description). There is moderate concentric  left ventricular hypertrophy. Left ventricular diastolic parameters were  normal.   2. Right ventricular systolic function is normal. The right ventricular  size is normal. Tricuspid regurgitation signal is inadequate for assessing  PA pressure.   3. The mitral valve is grossly normal. Mild mitral valve regurgitation.  No evidence of mitral stenosis.   4. The aortic valve is tricuspid. Aortic valve regurgitation is not  visualized. No aortic stenosis is present.   5. The inferior vena cava is normal in size with greater than 50%  respiratory variability, suggesting right atrial pressure of 3 mmHg.    CONCLUSIONS:  Ostial to proximal LAD 95% stenosis before the origin of diagonal #1.  Chronic total occlusion of the LAD after the first diagonal.  First diagonal contains segmental 50% narrowing.    Widely patent left main.  40 to 50% second obtuse marginal.  Dominant RCA with 60-70 % eccentric proximal stenosis.  The vessel is dominant and supplies right to left collaterals to LAD.  The LAD  appears to be a large target that is graftable. RECOMMENDATIONS:  Revascularization is needed.  Best long-term approach would be LIMA to LAD and saphenous vein graft to diagonal plus minus RCA.  There could potentially be a percutaneous approach although it would include a chronic total occlusion bifurcation stenosis in the proximal to mid LAD.  Recommend surgical evaluation and interventional opinions from team.     Neuro/Psych  Neuromuscular disease    GI/Hepatic Neg liver ROS,GERD  Medicated,,Appendicitis    Endo/Other  negative endocrine ROS    Renal/GU negative Renal ROSLab Results      Component                Value               Date                      CREATININE               1.37 (H)            06/23/2022                Musculoskeletal  (+) Arthritis ,    Abdominal   Peds  Hematology Lab Results      Component                Value               Date                      WBC  8.2                 06/23/2022                HGB                      14.0                06/23/2022                HCT                      41.7                06/23/2022                MCV                      83.4                06/23/2022                PLT                      250                 06/23/2022              Anesthesia Other Findings   Reproductive/Obstetrics                             Anesthesia Physical Anesthesia Plan  ASA: 3  Anesthesia Plan: General   Post-op Pain Management: Ofirmev IV (intra-op)*   Induction: Intravenous  PONV Risk Score and Plan: 2 and Ondansetron and Dexamethasone  Airway Management Planned: Oral ETT  Additional Equipment: None  Intra-op Plan:   Post-operative Plan: Extubation in OR  Informed Consent: I have reviewed the patients History and Physical, chart, labs and discussed the procedure including the risks, benefits and alternatives for the proposed anesthesia with the patient or  authorized representative who has indicated his/her understanding and acceptance.     Dental advisory given  Plan Discussed with: CRNA  Anesthesia Plan Comments:         Anesthesia Quick Evaluation

## 2022-06-23 NOTE — ED Triage Notes (Signed)
Pt c/o RLQ abd pain ongoing for one week. Was seen at PCP and had outpatient CT done today. Pt was referred to ED for acute appendicitis on CT scan. Pt denies N/V/D.

## 2022-06-23 NOTE — Telephone Encounter (Signed)
I reviewed over his labs CRP is elevated He has significant abdominal pain and tenderness Patient needs CAT scan abdomen pelvis this could be done at [facility] or down in [place] we need this scan to be completed preferably this week please mark as stat but patient works on the road for a living therefore it would be important to communicate with patient to find out when he would be available to do the CAT scan if possible we like to get the CAT scan done Thursday or Friday thank you

## 2022-06-23 NOTE — Addendum Note (Signed)
Addended by: Margaretha Sheffield on: 06/23/2022 10:31 AM   Modules accepted: Orders

## 2022-06-23 NOTE — Anesthesia Procedure Notes (Signed)
Procedure Name: Intubation Date/Time: 06/23/2022 10:12 PM  Performed by: Claudina Lick, CRNAPre-anesthesia Checklist: Patient identified, Emergency Drugs available, Suction available and Patient being monitored Patient Re-evaluated:Patient Re-evaluated prior to induction Oxygen Delivery Method: Circle system utilized Preoxygenation: Pre-oxygenation with 100% oxygen Induction Type: IV induction Ventilation: Mask ventilation without difficulty Laryngoscope Size: Miller and 2 Grade View: Grade I Tube type: Oral Tube size: 7.5 mm Number of attempts: 1 Airway Equipment and Method: Stylet Placement Confirmation: ETT inserted through vocal cords under direct vision, positive ETCO2 and breath sounds checked- equal and bilateral Secured at: 23 cm Tube secured with: Tape Dental Injury: Teeth and Oropharynx as per pre-operative assessment

## 2022-06-23 NOTE — Telephone Encounter (Signed)
Patient had surgery at [facility]

## 2022-06-23 NOTE — Telephone Encounter (Signed)
With and without please, abdomen pelvis because of it being mid abdominal pain thank you

## 2022-06-23 NOTE — Transfer of Care (Signed)
Immediate Anesthesia Transfer of Care Note  Patient: Brian Brewer  Procedure(s) Performed: APPENDECTOMY LAPAROSCOPIC (Abdomen)  Patient Location: PACU  Anesthesia Type:General  Level of Consciousness: awake and alert   Airway & Oxygen Therapy: Patient Spontanous Breathing  Post-op Assessment: Report given to RN and Post -op Vital signs reviewed and stable  Post vital signs: Reviewed and stable  Last Vitals:  Vitals Value Taken Time  BP 153/84 06/23/22 2305  Temp    Pulse 78 06/23/22 2306  Resp 15 06/23/22 2306  SpO2 96 % 06/23/22 2306  Vitals shown include unvalidated device data.  Last Pain:  Vitals:   06/23/22 2049  TempSrc:   PainSc: 1          Complications: No notable events documented.

## 2022-06-23 NOTE — ED Provider Notes (Signed)
[place] EMERGENCY DEPARTMENT AT [facility] Provider Note   CSN: [id number] Arrival date & time: 06/23/22  1655     History  Chief Complaint  Patient presents with   Abdominal Pain   HPI Brian Brewer is a 57 y.o. male with history of hypertension, CAD status post CABG in 2023, hyperlipidemia, GERD presenting for abdominal pain.  Started about a week ago.  Started about his umbilicus but now in the last couple days has radiated to his right lower quadrant.  It feels sharp.  States he did have some nausea this morning but no vomiting or diarrhea.  Denies fever or chills.  Was evaluated by his PCP and ordered a outpatient CT today which did reveal concern for acute appendicitis.   Abdominal Pain      Home Medications Prior to Admission medications   Medication Sig Start Date End Date Taking? Authorizing Provider  allopurinol (ZYLOPRIM) 100 MG tablet TAKE 1 TABLET DAILY 12/22/21   Lilyan Punt A, MD  amLODipine (NORVASC) 5 MG tablet TAKE 1 TABLET (5 MG TOTAL) BY MOUTH DAILY. 04/07/22   Babs Sciara, MD  aspirin EC 81 MG tablet Take 81 mg by mouth daily. Swallow whole.    [provider]  Azelastine-Fluticasone 137-50 MCG/ACT SUSP Place 1 spray into the nose every 12 (twelve) hours. Patient not taking: Reported on 06/22/2022 02/28/22   Babs Sciara, MD  esomeprazole (NEXIUM) 40 MG capsule TAKE 1 CAPSULE BY MOUTH EVERY DAY 01/18/22   Babs Sciara, MD  hydrochlorothiazide (MICROZIDE) 12.5 MG capsule Take 1 capsule (12.5 mg total) by mouth daily. 02/18/22   Joylene Grapes, NP  metoprolol tartrate (LOPRESSOR) 25 MG tablet Take 0.5 tablets (12.5 mg total) by mouth 2 (two) times daily. 03/01/22   Joylene Grapes, NP  ondansetron (ZOFRAN) 4 MG tablet Take 4 mg by mouth every 8 (eight) hours as needed.    [provider]  OTEZLA 30 MG TABS Take 1 tablet by mouth 2 (two) times daily.    [provider]  rosuvastatin (CRESTOR) 40 MG tablet Take 40 mg  by mouth daily. 04/07/22   [provider]      Allergies    Amoxicillin-pot clavulanate, Levofloxacin, Zithromax [azithromycin], Chantix [varenicline tartrate], and Oxycodone    Review of Systems   Review of Systems  Gastrointestinal:  Positive for abdominal pain.    Physical Exam   Vitals:   06/23/22 1714  BP: (!) 160/92  Pulse: 72  Resp: 16  Temp: 98.1 F (36.7 C)  SpO2: 100%    CONSTITUTIONAL:  well-appearing, NAD NEURO:  Alert and oriented x 3, CN 3-12 grossly intact EYES:  eyes equal and reactive ENT/NECK:  Supple, no stridor  CARDIO:  regular rate and rhythm, appears well-perfused  PULM:  No respiratory distress, CTAB GI/GU:  non-distended, soft, tenderness over McBurney's point, positive Rovsing sign MSK/SPINE:  No gross deformities, no edema, moves all extremities  SKIN:  no rash, atraumatic  *Additional and/or pertinent findings included in MDM below  ED Results / Procedures / Treatments   Labs (all labs ordered are listed, but only abnormal results are displayed) Labs Reviewed  LIPASE, BLOOD - Abnormal; Notable for the following components:      Result Value   Lipase 61 (*)    All other components within normal limits  COMPREHENSIVE METABOLIC PANEL - Abnormal; Notable for the following components:   Creatinine, Ser 1.37 (*)    All other components  within normal limits  URINALYSIS, ROUTINE W REFLEX MICROSCOPIC - Abnormal; Notable for the following components:   Specific Gravity, Urine 1.036 (*)    Bacteria, UA RARE (*)    All other components within normal limits  CBC    EKG None  Radiology CT ABDOMEN PELVIS W CONTRAST  Result Date: 06/23/2022 CLINICAL DATA:  Abdominal pain, elevated CRP EXAM: CT ABDOMEN AND PELVIS WITH CONTRAST TECHNIQUE: Multidetector CT imaging of the abdomen and pelvis was performed using the standard protocol following bolus administration of intravenous contrast. RADIATION DOSE REDUCTION: This exam was performed  according to the departmental dose-optimization program which includes automated exposure control, adjustment of the mA and/or kV according to patient size and/or use of iterative reconstruction technique. CONTRAST:  OMNIPAQUE IOHEXOL 300 MG/ML  SOLN COMPARISON:  CT abdomen dated 10/21/2011 FINDINGS: Lower chest: Lung bases are clear. Hepatobiliary: Liver is within normal limits. Status post cholecystectomy. No intrahepatic or extrahepatic duct dilatation. Pancreas: Within normal limits. Spleen: Within normal limits. Adrenals/Urinary Tract: 18 mm left adrenal nodule, measuring 10 HUs on prior unenhanced CT, compatible with a benign adrenal adenoma. No follow-up is recommended. Right adrenal glands are within normal limits. Kidneys are within normal limits.  No hydronephrosis. Bladder is obscured by streak artifact but grossly unremarkable. Stomach/Bowel: Stomach is within normal limits. No evidence of bowel obstruction. Dilated mid appendix, measuring 14 mm, with mild periappendiceal stranding (series 2/image 87), suggesting mild acute appendicitis. No drainable fluid collection/abscess. No free air to suggest macroscopic perforation. No colonic wall thickening or inflammatory changes. Vascular/Lymphatic: No evidence of abdominal aortic aneurysm. Atherosclerotic calcifications of the abdominal aorta and branch vessels. No suspicious abdominopelvic lymphadenopathy. Reproductive: Prostate is notable for dystrophic calcifications. Other: No abdominopelvic ascites. Musculoskeletal: Lumbar spine fixation hardware at L3-S1. Median sternotomy. IMPRESSION: Mild acute appendicitis, without evidence of complication. Additional ancillary findings as above. Electronically Signed   By: Charline Bills M.D.   On: 06/23/2022 13:12    Procedures Procedures    Medications Ordered in ED Medications  sodium chloride 0.9 % bolus 1,000 mL (has no administration in time range)  morphine (PF) 4 MG/ML injection 4 mg (has  no administration in time range)  ondansetron (ZOFRAN) injection 4 mg (has no administration in time range)  cefTRIAXone (ROCEPHIN) 2 g in sodium chloride 0.9 % 100 mL IVPB (has no administration in time range)    And  metroNIDAZOLE (FLAGYL) IVPB 500 mg (has no administration in time range)  Chlorhexidine Gluconate Cloth 2 % PADS 6 each (has no administration in time range)    ED Course/ Medical Decision Making/ A&P                             Medical Decision Making Amount and/or Complexity of Data Reviewed Labs: ordered.  Risk Prescription drug management. Decision regarding hospitalization.   57 year old well-appearing male presenting for abdominal pain. Exam notable for tenderness over McBurney's point and positive Rovsing sign.  Otherwise appears clinically well.  CT performed earlier today did reveal concern for appendicitis without perforation. Treated pain with morphine. Treated nausea with Zofran. Volume resuscitated with normal saline bolus.  Started antibiotics.  Reached out to Dr. Freida Busman of general surgery who advised for admission for appendicitis with plans for appendectomy later tonight.  She advised n.p.o. 4 OR, IV fluid hydration, pain control and continue IV antibiotics.  Patient admitted to surgery service.        Final Clinical Impression(s) /  ED Diagnoses Final diagnoses:  Appendicitis, unspecified appendicitis type    Rx / DC Orders ED Discharge Orders     None         Magdiel, [place], PA-C 06/23/22 2037    Gwyneth Sprout, MD 06/24/22 2238

## 2022-06-23 NOTE — Op Note (Signed)
Date: 06/23/22  Patient: Brian Brewer MRN: [id number]  Preoperative Diagnosis: Acute appendicitis Postoperative Diagnosis: Same  Procedure: Laparoscopic appendectomy  Surgeon: Sophronia Simas, MD  EBL: Minimal  Anesthesia: General endotracheal  Specimens: Appendix  Indications: [name] is a 57 yo male who presented to the ED with RLQ abdominal pain. A CT scan showed acute appendicitis without perforation. After a discussion of the risks and benefits of surgery, he agreed to proceed with appendectomy.  Findings: Acute appendicitis of the tip of the appendix. No evidence of perforation or abscess.  Procedure details: Informed consent was obtained in the preoperative area prior to the procedure. The patient was brought to the operating room and placed on the table in the supine position. General anesthesia was induced and appropriate lines and drains were placed for intraoperative monitoring. Perioperative antibiotics were administered per SCIP guidelines. The abdomen was prepped and draped in the usual sterile fashion. A pre-procedure timeout was taken verifying patient identity, surgical site and procedure to be performed.  A small infraumbilical skin incision was made and the subcutaneous tissue was spread to expose the fascia. The umbilical stalk was grasped and elevated, and the fascia was sharply incised. The peritoneal cavity was visualized and a 12mm Hasson trocar was inserted. The abdomen was inspected with no evidence of visceral or vascular injury. A suprapubic 5mm port was placed, followed by a 5mm port in the LLQ, both under direct visualization. The appendix was identified in the RLQ, and the tip of the appendix was dilated and inflamed. The base was normal in appearance. A mesenteric window was bluntly created at the base of the appendix, and the appendix was divided at the base from the cecum using a 45mm stapler with a blue load. The mid-portion of the appendix was adherent to  the retroperitoneum, and these attachments were taken down with harmonic shears. The mesoappendix was then divided with harmonic shears. The specimen was placed in an endocatch bag and the surgical site was irrigated. The staple line was inspected and appeared in tact with no bleeding or leakage. The cut surface of the mesoappendix was hemostatic. The ports were removed and the pneumoperitoneum was evacuated. The specimen was extracted via the umbilical port site and sent for routine pathology. The umbilical port site fascia was closed with an 0 Vicryl pursestring suture. The skin at all port sites was closed with 4-0 monocryl subcuticular suture. Dermabond was applied.  The patient tolerated the procedure well with no apparent complications. All counts were correct x2 at the end of the procedure. The patient was extubated and taken to PACU in stable condition.  Sophronia Simas, MD 06/23/22 10:52 PM

## 2022-06-23 NOTE — Telephone Encounter (Signed)
CT ordered in EPIC 

## 2022-06-23 NOTE — H&P (Signed)
Brian Brewer Brian Brewer 09/09/1965  [id number].     HPI:  [name] is a 57 yo male who presented to the ED today with worsening abdominal pain. He has been having periumbilical pain for about the last week. Today the pain migrated to the RLQ. He has had some nausea but no vomiting, fevers or diarrhea. He saw his PCP yesterday and was sent for a CT today, which showed acute appendicitis. He was sent to the ED. He last ate around 1pm this afternoon.  His only prior abdominal surgery is a lap cholecystectomy. He had a CABGx3 in February 2018 and is currently on aspirin 81mg  daily. He saw his cardiologist earlier this week (note is still pending), and says 6 month follow up is planned. He denies any chest pain or shortness of breath with activity.   ROS: Review of Systems  Constitutional:  Negative for chills and fever.  Respiratory:  Negative for shortness of breath.   Cardiovascular:  Negative for chest pain.  Gastrointestinal:  Positive for abdominal pain and nausea. Negative for diarrhea and vomiting.  Neurological:  Negative for loss of consciousness.    Family History  Problem Relation Age of Onset   Heart disease Father    Hypertension Father    Leukemia Father    Anesthesia problems Neg Hx    Hypotension Neg Hx    Malignant hyperthermia Neg Hx    Pseudochol deficiency Neg Hx     Past Medical History:  Diagnosis Date   Cancer (HCC)    melanoma on the back was removed 10/2018   GERD (gastroesophageal reflux disease)    Gout    Heart attack (HCC)    Hyperlipemia    Hypertension    Psoriatic arthritis (HCC) 12/11/2013   Dr Dierdre Forth     Past Surgical History:  Procedure Laterality Date   BACK SURGERY  2003   L4,5,S1 Fusion   CHOLECYSTECTOMY     COLONOSCOPY N/A 12/29/2016   Procedure: COLONOSCOPY;  Surgeon: Corbin Ade, MD;  Location: [facility];  Service: Endoscopy;  Laterality: N/A;  9:15 AM   CORONARY ARTERY BYPASS GRAFT N/A 03/17/2021   Procedure: CORONARY ARTERY  BYPASS GRAFTING (CABG) X THREE USING RIGHT GREATER SAPHENOUS VEIN HARVESTED ENDOSCOPICALLY AND LEFT INTERNAL MAMMARY ARTERY.;  Surgeon: Lovett Sox, MD;  Location: [facility] OR;  Service: Open Heart Surgery;  Laterality: N/A;   LEFT HEART CATH AND CORONARY ANGIOGRAPHY N/A 03/16/2021   Procedure: LEFT HEART CATH AND CORONARY ANGIOGRAPHY;  Surgeon: Lyn Records, MD;  Location: [facility] INVASIVE CV LAB;  Service: Cardiovascular;  Laterality: N/A;   LUMBAR FUSION     3 yrs ago-[place]   TEE WITHOUT CARDIOVERSION N/A 03/17/2021   Procedure: TRANSESOPHAGEAL ECHOCARDIOGRAM (TEE);  Surgeon: Lovett Sox, MD;  Location: [facility] OR;  Service: Open Heart Surgery;  Laterality: N/A;   TOTAL HIP ARTHROPLASTY Right 02/15/2019   Procedure: RIGHT TOTAL HIP ARTHROPLASTY ANTERIOR APPROACH;  Surgeon: Kathryne Hitch, MD;  Location: [facility] ORS;  Service: Orthopedics;  Laterality: Right;    Social History:  reports that he quit smoking about 15 months ago. His smoking use included cigarettes. He has a 10.00 pack-year smoking history. He has never used smokeless tobacco. He reports that he does not drink alcohol and does not use drugs.  Allergies:  Allergies  Allergen Reactions   Amoxicillin-Pot Clavulanate Other (See Comments)    Cramps and diarrhea Has patient had a PCN reaction causing immediate rash, facial/tongue/throat swelling, SOB or lightheadedness  with hypotension: No Has patient had a PCN reaction causing severe rash involving mucus membranes or skin necrosis: No Has patient had a PCN reaction that required hospitalization: No Has patient had a PCN reaction occurring within the last 10 years: No  If all of the above answers are "NO", then may proceed with Cephalosporin use.   Levofloxacin Other (See Comments)    Passed out (possibly due to medication)   Zithromax [Azithromycin] Diarrhea   Chantix [Varenicline Tartrate] Nausea And Vomiting   Oxycodone Diarrhea, Itching and Anxiety    (Not in a hospital  admission)    Physical Exam: Blood pressure (!) 160/92, pulse 72, temperature 98.1 F (36.7 C), temperature source Oral, resp. rate 16, SpO2 100 %. General: resting comfortably, appears stated age, no apparent distress Neurological: alert and oriented, no focal deficits, cranial nerves grossly in tact HEENT: normocephalic, atraumatic, oropharynx clear, no scleral icterus CV: regular rate and rhythm, well-healed median sternotomy Respiratory: normal work of breathing on room air Abdomen: soft, nondistended, mildly tender in RLQ, no rebound tenderness or guarding. Extremities: warm and well-perfused, no deformities, moving all extremities spontaneously Psychiatric: normal mood and affect Skin: warm and dry, no jaundice, no rashes or lesions   Results for orders placed or performed during the hospital encounter of 06/23/22 (from the past 48 hour(s))  Lipase, blood     Status: Abnormal   Collection Time: 06/23/22  5:20 PM  Result Value Ref Range   Lipase 61 (H) 11 - 51 U/L    Comment: Performed at [facility], [address], [place], [place] [address]  Comprehensive metabolic panel     Status: Abnormal   Collection Time: 06/23/22  5:20 PM  Result Value Ref Range   Sodium 137 135 - 145 mmol/L   Potassium 3.8 3.5 - 5.1 mmol/L   Chloride 105 98 - 111 mmol/L   CO2 23 22 - 32 mmol/L   Glucose, Bld 91 70 - 99 mg/dL    Comment: Glucose reference range applies only to samples taken after fasting for at least 8 hours.   BUN 16 6 - 20 mg/dL   Creatinine, Ser 5.78 (H) 0.61 - 1.24 mg/dL   Calcium 9.3 8.9 - 46.9 mg/dL   Total Protein 7.1 6.5 - 8.1 g/dL   Albumin 3.8 3.5 - 5.0 g/dL   AST 21 15 - 41 U/L   ALT 23 0 - 44 U/L   Alkaline Phosphatase 59 38 - 126 U/L   Total Bilirubin 0.5 0.3 - 1.2 mg/dL   GFR, Estimated >62 >95 mL/min    Comment: (NOTE) Calculated using the CKD-EPI Creatinine Equation (2021)    Anion gap 9 5 - 15    Comment: Performed at [facility], [address] [place], [place] [address]  CBC     Status: None   Collection Time: 06/23/22  5:20 PM  Result Value Ref Range   WBC 8.2 4.0 - 10.5 K/uL   RBC 5.00 4.22 - 5.81 MIL/uL   Hemoglobin 14.0 13.0 - 17.0 g/dL   HCT 24.4 01.0 - 27.2 %   MCV 83.4 80.0 - 100.0 fL   MCH 28.0 26.0 - 34.0 pg   MCHC 33.6 30.0 - 36.0 g/dL   RDW 53.6 64.4 - 03.4 %   Platelets 250 150 - 400 K/uL   nRBC 0.0 0.0 - 0.2 %    Comment: Performed at [facility], [address], [place], [place] [address]  Urinalysis, Routine w reflex microscopic -Urine, Clean Catch     Status: Abnormal   Collection Time: 06/23/22  5:20 PM  Result Value Ref Range   Color, Urine YELLOW YELLOW   APPearance CLEAR CLEAR   Specific Gravity, Urine 1.036 (H) 1.005 - 1.030   pH 6.0 5.0 - 8.0   Glucose, UA NEGATIVE NEGATIVE mg/dL   Hgb urine dipstick NEGATIVE NEGATIVE   Bilirubin Urine NEGATIVE NEGATIVE   Ketones, ur NEGATIVE NEGATIVE mg/dL   Protein, ur NEGATIVE NEGATIVE mg/dL   Nitrite NEGATIVE NEGATIVE   Leukocytes,Ua NEGATIVE NEGATIVE   RBC / HPF 0-5 0 - 5 RBC/hpf   WBC, UA 0-5 0 - 5 WBC/hpf   Bacteria, UA RARE (A) NONE SEEN   Squamous Epithelial / HPF 0-5 0 - 5 /HPF    Comment: Performed at [facility], [address] [place], [place] [address]   CT ABDOMEN PELVIS W CONTRAST  Result Date: 06/23/2022 CLINICAL DATA:  Abdominal pain, elevated CRP EXAM: CT ABDOMEN AND PELVIS WITH CONTRAST TECHNIQUE: Multidetector CT imaging of the abdomen and pelvis was performed using the standard protocol following bolus administration of intravenous contrast. RADIATION DOSE REDUCTION: This exam was performed according to the departmental dose-optimization program which includes automated exposure control, adjustment of the mA and/or kV according to patient size and/or use of iterative reconstruction technique. CONTRAST:  OMNIPAQUE IOHEXOL 300 MG/ML  SOLN COMPARISON:  CT abdomen dated 10/21/2011 FINDINGS: Lower chest: Lung bases are  clear. Hepatobiliary: Liver is within normal limits. Status post cholecystectomy. No intrahepatic or extrahepatic duct dilatation. Pancreas: Within normal limits. Spleen: Within normal limits. Adrenals/Urinary Tract: 18 mm left adrenal nodule, measuring 10 HUs on prior unenhanced CT, compatible with a benign adrenal adenoma. No follow-up is recommended. Right adrenal glands are within normal limits. Kidneys are within normal limits.  No hydronephrosis. Bladder is obscured by streak artifact but grossly unremarkable. Stomach/Bowel: Stomach is within normal limits. No evidence of bowel obstruction. Dilated mid appendix, measuring 14 mm, with mild periappendiceal stranding (series 2/image 87), suggesting mild acute appendicitis. No drainable fluid collection/abscess. No free air to suggest macroscopic perforation. No colonic wall thickening or inflammatory changes. Vascular/Lymphatic: No evidence of abdominal aortic aneurysm. Atherosclerotic calcifications of the abdominal aorta and branch vessels. No suspicious abdominopelvic lymphadenopathy. Reproductive: Prostate is notable for dystrophic calcifications. Other: No abdominopelvic ascites. Musculoskeletal: Lumbar spine fixation hardware at L3-S1. Median sternotomy. IMPRESSION: Mild acute appendicitis, without evidence of complication. Additional ancillary findings as above. Electronically Signed   By: Charline Bills M.D.   On: 06/23/2022 13:12      Assessment/Plan 57 yo male presenting with acute abdominal pain secondary to appendicitis. I personally reviewed his labs, notes, and imaging. CT scan shows a dilated appendix with stranding, consistent with appendicitis, with no signs of perforation. He has a history of CAD and had a CABG last year, but has been doing well per cardiology notes with no concerning symptoms. I reviewed the details of laparoscopic appendectomy, including benefits and risks of bleeding, infection, and appendiceal stump leak. He  expressed understanding and agrees to proceed with surgery. - NPO for OR, IV fluid hydration - Pain control - IV antibiotics to begin in ED (ceftriaxone and flagyl) - Proceed to OR tonight for appendectomy. Likely observe overnight postoperatively.   Sophronia Simas, MD [facility] Surgery General, Hepatobiliary and Pancreatic Surgery 06/23/22 7:29 PM

## 2022-06-24 ENCOUNTER — Encounter (HOSPITAL_COMMUNITY): Payer: Self-pay | Admitting: Surgery

## 2022-06-24 LAB — CBC
HCT: 39.8 % (ref 39.0–52.0)
Hemoglobin: 13.2 g/dL (ref 13.0–17.0)
MCH: 28.1 pg (ref 26.0–34.0)
MCHC: 33.2 g/dL (ref 30.0–36.0)
MCV: 84.7 fL (ref 80.0–100.0)
Platelets: 211 10*3/uL (ref 150–400)
RBC: 4.7 MIL/uL (ref 4.22–5.81)
RDW: 13.1 % (ref 11.5–15.5)
WBC: 10.1 10*3/uL (ref 4.0–10.5)
nRBC: 0 % (ref 0.0–0.2)

## 2022-06-24 LAB — CREATININE, SERUM
Creatinine, Ser: 1.1 mg/dL (ref 0.61–1.24)
GFR, Estimated: >60 mL/min (ref >60)

## 2022-06-24 MED ORDER — DOCUSATE SODIUM 100 MG PO CAPS: 100.0000 mg | ORAL_CAPSULE | Freq: Two times a day (BID) | ORAL | 0 refills | Status: AC

## 2022-06-24 MED ORDER — METHOCARBAMOL 500 MG PO TABS: 500.0000 mg | ORAL_TABLET | Freq: Three times a day (TID) | ORAL | 0 refills | Status: DC | PRN

## 2022-06-24 MED ORDER — HYDROCODONE-ACETAMINOPHEN 5-325 MG PO TABS: 1.0000 | ORAL_TABLET | Freq: Four times a day (QID) | ORAL | 0 refills | Status: DC | PRN

## 2022-06-24 NOTE — Anesthesia Postprocedure Evaluation (Signed)
Anesthesia Post Note  Patient: Brian Brewer  Procedure(s) Performed: APPENDECTOMY LAPAROSCOPIC (Abdomen)     Patient location during evaluation: PACU Anesthesia Type: General Level of consciousness: awake and patient cooperative Pain management: pain level controlled Vital Signs Assessment: post-procedure vital signs reviewed and stable Respiratory status: spontaneous breathing, nonlabored ventilation and respiratory function stable Cardiovascular status: blood pressure returned to baseline and stable Postop Assessment: no apparent nausea or vomiting Anesthetic complications: no   No notable events documented.  Last Vitals:  Vitals:   06/23/22 2330 06/24/22 0004  BP:  (!) 155/91  Pulse: 78 65  Resp: 19 18  Temp:  36.7 C  SpO2: 93% 96%    Last Pain:  Vitals:   06/24/22 0004  TempSrc: Oral  PainSc:                  [name]

## 2022-06-24 NOTE — Discharge Instructions (Signed)
[facility] DISCHARGE INSTRUCTIONS  Activity No heavy lifting greater than 15 pounds for 4 weeks after surgery. Ok to shower in 24 hours, but do not bathe or submerge incisions underwater. Do not drive while taking narcotic pain medication.  Wound Care Your incisions are covered with skin glue called Dermabond. This will peel off on its own over time. You may shower and allow warm soapy water to run over your incisions. Gently pat dry. Do not submerge your incision underwater. Monitor your incision for any new redness, tenderness, or drainage.  When to Call us: Fever greater than 100.5 New redness, drainage, or swelling at incision site Severe pain, nausea, or vomiting  Follow-up You will be scheduled for a postop appointment in about 1 month. Our office will contact you next week to schedule this, or you may call the number below. This will be at the [facility] Surgery office at [address], [address], [place], [place]. Please arrive at least 15 minutes prior to your scheduled appointment time.  For questions or concerns, please call the office at [phone].

## 2022-06-24 NOTE — Progress Notes (Signed)
Discharge instructions reviewed with patient by Roland Earl, RN.  Patient discharged in stable condition via wheelchair to home with wife.

## 2022-06-24 NOTE — Discharge Summary (Signed)
Physician Discharge Summary   Patient ID: Brian Brewer [id number] 57 y.o. 11-Jul-1965  Admit date: 06/23/2022  Discharge date and time: 06/24/2022  Admitting Physician: Fritzi Mandes, MD   Discharge Physician: Sophronia Simas, MD  Admission Diagnoses: Appendicitis, unspecified appendicitis type [K37] Appendicitis [K37]  Discharge Diagnoses: Appendicitis without perforation or abscess  Admission Condition: fair  Discharged Condition: good  Indication for Admission: Brian Brewer is a 57 yo male who presented to the ED with worsening RLQ abdominal pain. CT scan showed acute appendicitis without perforation. Please see separately dictated H&P for further details.  Hospital Course: The patient was taken to the OR the evening of 5/23 for a laparoscopic appendectomy. Please see separately dictated operative note for further details of this procedure. Postoperatively he was admitted to the med-surg floor in stable condition for overnight observation. On the morning of POD1 he was stable. His pain was controlled and he was tolerating oral intake. He was examined and deemed appropriate for discharge home.  Consults: None  Significant Diagnostic Studies: radiology: IMPRESSION: Mild acute appendicitis, without evidence of complication.  Treatments: IV hydration, antibiotics: ceftriaxone and metronidazole, analgesia: Dilaudid and hydrocodone, and surgery: Laparoscopic appendectomy  Discharge Exam: General: resting comfortably, NAD Neuro: alert and oriented, no focal deficits Resp: normal work of breathing on room air Abdomen: soft, nondistended, nontender to palpation. Incisions clean and dry, ecchymoses at suprapubic incision. Extremities: warm and well-perfused   Disposition: Discharge disposition: 01-Home or Self Care       Patient Instructions:  Allergies as of 06/24/2022       Reactions   Amoxicillin-pot Clavulanate Other (See Comments)   Cramps and diarrhea Has patient had  a PCN reaction causing immediate rash, facial/tongue/throat swelling, SOB or lightheadedness with hypotension: No Has patient had a PCN reaction causing severe rash involving mucus membranes or skin necrosis: No Has patient had a PCN reaction that required hospitalization: No Has patient had a PCN reaction occurring within the last 10 years: No If all of the above answers are "NO", then may proceed with Cephalosporin use.   Levofloxacin Other (See Comments)   Passed out (possibly due to medication)   Zithromax [azithromycin] Diarrhea   Chantix [varenicline Tartrate] Nausea And Vomiting   Oxycodone Diarrhea, Itching, Anxiety        Medication List     TAKE these medications    allopurinol 100 MG tablet Commonly known as: ZYLOPRIM TAKE 1 TABLET DAILY   amLODipine 5 MG tablet Commonly known as: NORVASC TAKE 1 TABLET (5 MG TOTAL) BY MOUTH DAILY.   aspirin EC 81 MG tablet Take 81 mg by mouth daily. Swallow whole.   Azelastine-Fluticasone 137-50 MCG/ACT Susp Place 1 spray into the nose every 12 (twelve) hours.   docusate sodium 100 MG capsule Commonly known as: COLACE Take 1 capsule (100 mg total) by mouth 2 (two) times daily for 14 days.   esomeprazole 40 MG capsule Commonly known as: NEXIUM TAKE 1 CAPSULE BY MOUTH EVERY DAY What changed: how much to take   hydrochlorothiazide 12.5 MG capsule Commonly known as: MICROZIDE Take 1 capsule (12.5 mg total) by mouth daily.   HYDROcodone-acetaminophen 5-325 MG tablet Commonly known as: NORCO/VICODIN Take 1 tablet by mouth every 6 (six) hours as needed for severe pain.   methocarbamol 500 MG tablet Commonly known as: ROBAXIN Take 1 tablet (500 mg total) by mouth every 8 (eight) hours as needed for muscle spasms.   metoprolol tartrate 25 MG tablet Commonly known as: LOPRESSOR Take  0.5 tablets (12.5 mg total) by mouth 2 (two) times daily. What changed: how much to take   ondansetron 4 MG tablet Commonly known as:  ZOFRAN Take 4 mg by mouth 2 (two) times daily.   Otezla 30 MG Tabs Generic drug: Apremilast Take 1 tablet by mouth 2 (two) times daily.   rosuvastatin 40 MG tablet Commonly known as: CRESTOR Take 40 mg by mouth daily.       Activity: no driving while on analgesics and no heavy lifting for 4 weeks Diet: cardiac diet Wound Care: keep wound clean and dry  Follow-up in [facility] Surgery office in 4 weeks.  Signed: Fritzi Mandes 06/24/2022 7:36 AM

## 2022-06-24 NOTE — Plan of Care (Signed)

## 2022-06-26 ENCOUNTER — Other Ambulatory Visit: Payer: Self-pay | Admitting: Family Medicine

## 2022-06-27 ENCOUNTER — Telehealth: Payer: Self-pay

## 2022-06-27 NOTE — Transitions of Care (Post Inpatient/ED Visit) (Signed)
06/27/2022  Name: Brian Brewer MRN: [id number] DOB: Jun 27, 1965  Today's TOC FU Call Status: Today's TOC FU Call Status:: Successful TOC FU Call Competed TOC FU Call Complete Date: 06/27/22  Transition Care Management Follow-up Telephone Call Date of Discharge: 06/24/22 Discharge Facility: [facility]) Type of Discharge: Inpatient Admission Primary Inpatient Discharge Diagnosis:: appenditis How have you been since you were released from the hospital?: Better Any questions or concerns?: No  Items Reviewed: Did you receive and understand the discharge instructions provided?: Yes Medications obtained,verified, and reconciled?: Yes (Medications Reviewed) Any new allergies since your discharge?: No Dietary orders reviewed?: Yes Do you have support at home?: Yes People in Home: spouse  Medications Reviewed Today: Medications Reviewed Today     Reviewed by Karena Addison, LPN (Licensed Practical Nurse) on 06/27/22 at 1440  Med List Status: <None>   Medication Order Taking? Sig Documenting Provider Last Dose Status Informant  allopurinol (ZYLOPRIM) 100 MG tablet [id number] Yes TAKE 1 TABLET DAILY Luking, Scott A, MD Taking Active Spouse/Significant Other  amLODipine (NORVASC) 5 MG tablet [id number] Yes TAKE 1 TABLET (5 MG TOTAL) BY MOUTH DAILY. Babs Sciara, MD Taking Active Spouse/Significant Other  aspirin EC 81 MG tablet [id number] Yes Take 81 mg by mouth daily. Swallow whole. [provider] Taking Active Spouse/Significant Other  Azelastine-Fluticasone 137-50 MCG/ACT SUSP [id number] No Place 1 spray into the nose every 12 (twelve) hours.  Patient not taking: Reported on 06/22/2022   Babs Sciara, MD Not Taking Active Spouse/Significant Other  docusate sodium (COLACE) 100 MG capsule [id number] Yes Take 1 capsule (100 mg total) by mouth 2 (two) times daily for 14 days. Fritzi Mandes, MD Taking Active   esomeprazole (NEXIUM) 40 MG capsule [id number] Yes TAKE 1 CAPSULE  BY MOUTH EVERY DAY  Patient taking differently: Take 40 mg by mouth daily.   Babs Sciara, MD Taking Active Spouse/Significant Other  hydrochlorothiazide (MICROZIDE) 12.5 MG capsule [id number] Yes Take 1 capsule (12.5 mg total) by mouth daily. Joylene Grapes, NP Taking Active Spouse/Significant Other  HYDROcodone-acetaminophen (NORCO/VICODIN) 5-325 MG tablet [id number] Yes Take 1 tablet by mouth every 6 (six) hours as needed for severe pain. Fritzi Mandes, MD Taking Active   methocarbamol (ROBAXIN) 500 MG tablet [id number] Yes Take 1 tablet (500 mg total) by mouth every 8 (eight) hours as needed for muscle spasms. Fritzi Mandes, MD Taking Active   metoprolol tartrate (LOPRESSOR) 25 MG tablet [id number] Yes Take 0.5 tablets (12.5 mg total) by mouth 2 (two) times daily.  Patient taking differently: Take 25 mg by mouth 2 (two) times daily.   Joylene Grapes, NP Taking Active Spouse/Significant Other  ondansetron (ZOFRAN) 4 MG tablet [id number] Yes Take 4 mg by mouth 2 (two) times daily. [provider] Taking Active Spouse/Significant Other           Med Note (WHITE, Karlene Lineman Jun 23, 2022  9:58 PM) Taken with the [facility]  OTEZLA 30 MG TABS [id number] Yes Take 1 tablet by mouth 2 (two) times daily. [provider] Taking Active Spouse/Significant Other  rosuvastatin (CRESTOR) 40 MG tablet [id number] Yes Take 40 mg by mouth daily. [provider] Taking Active Spouse/Significant Other            Home Care and Equipment/Supplies: Were Home Health Services Ordered?: NA Any new equipment or medical supplies ordered?: NA  Functional Questionnaire: Do you need assistance with bathing/showering or dressing?: No Do you need assistance with  meal preparation?: No Do you need assistance with eating?: No Do you have difficulty maintaining continence: No Do you need assistance with getting out of bed/getting out of a chair/moving?: No Do you have difficulty managing or  taking your medications?: No  Follow up appointments reviewed: PCP Follow-up appointment confirmed?: NA Specialist Hospital Follow-up appointment confirmed?: No Reason Specialist Follow-Up Not Confirmed: Patient has Specialist Provider Number and will Call for Appointment Do you need transportation to your follow-up appointment?: No Do you understand care options if your condition(s) worsen?: Yes-patient verbalized understanding    SIGNATURE Karena Addison, LPN [facility] Nurse Health Advisor Direct Dial [phone]

## 2022-06-28 ENCOUNTER — Encounter: Payer: Self-pay | Admitting: Cardiovascular Disease

## 2022-06-28 LAB — SURGICAL PATHOLOGY

## 2022-07-02 ENCOUNTER — Other Ambulatory Visit: Payer: Self-pay | Admitting: Nurse Practitioner

## 2022-07-16 ENCOUNTER — Other Ambulatory Visit: Payer: Self-pay | Admitting: Family Medicine

## 2022-08-09 ENCOUNTER — Telehealth: Payer: Commercial Managed Care - PPO | Admitting: Physician Assistant

## 2022-08-09 DIAGNOSIS — J069 Acute upper respiratory infection, unspecified: Secondary | ICD-10-CM

## 2022-08-09 DIAGNOSIS — B9689 Other specified bacterial agents as the cause of diseases classified elsewhere: Secondary | ICD-10-CM

## 2022-08-09 MED ORDER — DOXYCYCLINE HYCLATE 100 MG PO TABS
100.0000 mg | ORAL_TABLET | Freq: Two times a day (BID) | ORAL | 0 refills | Status: DC
Start: 2022-08-09 — End: 2022-08-29

## 2022-08-09 MED ORDER — BENZONATATE 100 MG PO CAPS
100.0000 mg | ORAL_CAPSULE | Freq: Three times a day (TID) | ORAL | 0 refills | Status: DC | PRN
Start: 2022-08-09 — End: 2022-08-29

## 2022-08-09 MED ORDER — PROMETHAZINE-DM 6.25-15 MG/5ML PO SYRP
5.0000 mL | ORAL_SOLUTION | Freq: Four times a day (QID) | ORAL | 0 refills | Status: DC | PRN
Start: 2022-08-09 — End: 2022-08-29

## 2022-08-09 MED ORDER — PREDNISONE 20 MG PO TABS
40.0000 mg | ORAL_TABLET | Freq: Every day | ORAL | 0 refills | Status: DC
Start: 2022-08-09 — End: 2022-08-29

## 2022-08-09 NOTE — Progress Notes (Signed)
Virtual Visit Consent   [name], you are scheduled for a virtual visit with a [facility] provider today. Just as with appointments in the office, your consent must be obtained to participate. Your consent will be active for this visit and any virtual visit you may have with one of our providers in the next 365 days. If you have a MyChart account, a copy of this consent can be sent to you electronically.  As this is a virtual visit, video technology does not allow for your provider to perform a traditional examination. This may limit your provider's ability to fully assess your condition. If your provider identifies any concerns that need to be evaluated in person or the need to arrange testing (such as labs, EKG, etc.), we will make arrangements to do so. Although advances in technology are sophisticated, we cannot ensure that it will always work on either your end or our end. If the connection with a video visit is poor, the visit may have to be switched to a telephone visit. With either a video or telephone visit, we are not always able to ensure that we have a secure connection.  By engaging in this virtual visit, you consent to the provision of healthcare and authorize for your insurance to be billed (if applicable) for the services provided during this visit. Depending on your insurance coverage, you may receive a charge related to this service.  I need to obtain your verbal consent now. Are you willing to proceed with your visit today? [name] has provided verbal consent on 08/09/2022 for a virtual visit (video or telephone). Margaretann Loveless, PA-C  Date: 08/09/2022 12:28 PM  Virtual Visit via Video Note   I, Margaretann Loveless, connected with  Brian Brewer  ([id number], [birth year]) on 08/09/22 at 12:30 PM EDT by a video-enabled telemedicine application and verified that I am speaking with the correct person using two identifiers.  Location: Patient: Virtual Visit Location  Patient: Mobile Provider: Virtual Visit Location Provider: Home Office   I discussed the limitations of evaluation and management by telemedicine and the availability of in person appointments. The patient expressed understanding and agreed to proceed.    History of Present Illness: Brian Brewer is a 57 y.o. who identifies as a male who was assigned male at birth, and is being seen today for head and chest congestion.  HPI: URI  This is a new problem. The current episode started 1 to 4 weeks ago (1.5 weeks). The problem has been gradually worsening. There has been no fever. Associated symptoms include congestion, coughing, headaches, rhinorrhea, sinus pain and wheezing (improves with cough). Pertinent negatives include no chest pain, diarrhea, ear pain, nausea, plugged ear sensation, sore throat, swollen glands or vomiting. He has tried acetaminophen (Mucinex, Dayquil, Nyquil) for the symptoms. The treatment provided no relief.      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   Bronchitis due to tobacco use 10/27/2017   Psoriatic arthritis (HCC) 12/11/2013   Hyperuricemia 12/11/2013   HTN (hypertension),  benign 10/22/2012   Fasting hyperglycemia 10/22/2012   Hyperlipemia 10/22/2012   Allergic rhinitis 05/21/2012   GERD 05/27/2009    Allergies:  Allergies  Allergen Reactions   Amoxicillin-Pot Clavulanate Other (See Comments)    Cramps and diarrhea Has patient had a PCN reaction causing immediate rash, facial/tongue/throat  swelling, SOB or lightheadedness with hypotension: No Has patient had a PCN reaction causing severe rash involving mucus membranes or skin necrosis: No Has patient had a PCN reaction that required hospitalization: No Has patient had a PCN reaction occurring within the last 10 years: No  If all of the above answers are "NO", then may proceed with Cephalosporin use.   Levofloxacin Other (See Comments)    Passed out (possibly due to medication)   Zithromax [Azithromycin] Diarrhea   Chantix [Varenicline Tartrate] Nausea And Vomiting   Oxycodone Diarrhea, Itching and Anxiety   Medications:  Current Outpatient Medications:    benzonatate (TESSALON) 100 MG capsule, Take 1 capsule (100 mg total) by mouth 3 (three) times daily as needed., Disp: 30 capsule, Rfl: 0   doxycycline (VIBRA-TABS) 100 MG tablet, Take 1 tablet (100 mg total) by mouth 2 (two) times daily., Disp: 20 tablet, Rfl: 0   predniSONE (DELTASONE) 20 MG tablet, Take 2 tablets (40 mg total) by mouth daily with breakfast., Disp: 10 tablet, Rfl: 0   promethazine-dextromethorphan (PROMETHAZINE-DM) 6.25-15 MG/5ML syrup, Take 5 mLs by mouth 4 (four) times daily as needed., Disp: 118 mL, Rfl: 0   allopurinol (ZYLOPRIM) 100 MG tablet, TAKE 1 TABLET DAILY, Disp: 90 tablet, Rfl: 1   amLODipine (NORVASC) 5 MG tablet, TAKE 1 TABLET (5 MG TOTAL) BY MOUTH DAILY., Disp: 30 tablet, Rfl: 5   aspirin EC 81 MG tablet, Take 81 mg by mouth daily. Swallow whole., Disp: , Rfl:    Azelastine-Fluticasone 137-50 MCG/ACT SUSP, Place 1 spray into the nose every 12 (twelve) hours. (Patient not taking: Reported on 06/22/2022), Disp: 23 g, Rfl: 5   esomeprazole (NEXIUM) 40 MG capsule, TAKE 1 CAPSULE BY MOUTH EVERY DAY, Disp: 90 capsule, Rfl: 1   hydrochlorothiazide (MICROZIDE) 12.5 MG capsule, Take 1 capsule (12.5 mg total) by mouth daily., Disp: 90 capsule, Rfl: 3   HYDROcodone-acetaminophen (NORCO/VICODIN) 5-325 MG tablet, Take 1 tablet by mouth every 6 (six) hours  as needed for severe pain., Disp: 15 tablet, Rfl: 0   methocarbamol (ROBAXIN) 500 MG tablet, Take 1 tablet (500 mg total) by mouth every 8 (eight) hours as needed for muscle spasms., Disp: 10 tablet, Rfl: 0   metoprolol tartrate (LOPRESSOR) 25 MG tablet, Take 0.5 tablets (12.5 mg total) by mouth 2 (two) times daily. (Patient taking differently: Take 25 mg by mouth 2 (two) times daily.), Disp: 90 tablet, Rfl: 3   ondansetron (ZOFRAN) 4 MG tablet, Take 4 mg by mouth 2 (two) times daily., Disp: , Rfl:    OTEZLA 30 MG TABS, Take 1 tablet by mouth 2 (two) times daily., Disp: , Rfl:    rosuvastatin (CRESTOR) 40 MG tablet, TAKE 1 TABLET BY MOUTH EVERY DAY, Disp: 90 tablet, Rfl: 3  Observations/Objective: Patient is well-developed, well-nourished in no acute distress.  Resting comfortably  Head is normocephalic, atraumatic.  No labored breathing.  Speech is clear and coherent with logical content.  Patient is alert and oriented at baseline.    Assessment and Plan: 1. Bacterial upper respiratory infection - doxycycline (VIBRA-TABS) 100 MG tablet; Take 1 tablet (100 mg total) by mouth 2 (two) times daily.  Dispense: 20 tablet; Refill:  0 - predniSONE (DELTASONE) 20 MG tablet; Take 2 tablets (40 mg total) by mouth daily with breakfast.  Dispense: 10 tablet; Refill: 0 - benzonatate (TESSALON) 100 MG capsule; Take 1 capsule (100 mg total) by mouth 3 (three) times daily as needed.  Dispense: 30 capsule; Refill: 0 - promethazine-dextromethorphan (PROMETHAZINE-DM) 6.25-15 MG/5ML syrup; Take 5 mLs by mouth 4 (four) times daily as needed.  Dispense: 118 mL; Refill: 0  - Worsening symptoms that have not responded to OTC medications.  - Will give Augmentin and Prednisone - Promethazine DM and Tessalon for cough - Continue allergy medications.  - Steam and humidifier can help - Stay well hydrated and get plenty of rest.  - Seek in person evaluation if no symptom improvement or if symptoms worsen   Follow  Up Instructions: I discussed the assessment and treatment plan with the patient. The patient was provided an opportunity to ask questions and all were answered. The patient agreed with the plan and demonstrated an understanding of the instructions.  A copy of instructions were sent to the patient via MyChart unless otherwise noted below.    The patient was advised to call back or seek an in-person evaluation if the symptoms worsen or if the condition fails to improve as anticipated.  Time:  I spent 10 minutes with the patient via telehealth technology discussing the above problems/concerns.    Margaretann Loveless, PA-C

## 2022-08-09 NOTE — Patient Instructions (Signed)
Brian Brewer, thank you for joining Margaretann Loveless, PA-C for today's virtual visit.  While this provider is not your primary care provider (PCP), if your PCP is located in our provider database this encounter information will be shared with them immediately following your visit.   A [facility] MyChart account gives you access to today's visit and all your visits, tests, and labs performed at [facility] " click here if you don't have a [facility] MyChart account or go to [email or web address]  Consent: (Patient) Brian Brewer provided verbal consent for this virtual visit at the beginning of the encounter.  Current Medications:  Current Outpatient Medications:    benzonatate (TESSALON) 100 MG capsule, Take 1 capsule (100 mg total) by mouth 3 (three) times daily as needed., Disp: 30 capsule, Rfl: 0   doxycycline (VIBRA-TABS) 100 MG tablet, Take 1 tablet (100 mg total) by mouth 2 (two) times daily., Disp: 20 tablet, Rfl: 0   predniSONE (DELTASONE) 20 MG tablet, Take 2 tablets (40 mg total) by mouth daily with breakfast., Disp: 10 tablet, Rfl: 0   promethazine-dextromethorphan (PROMETHAZINE-DM) 6.25-15 MG/5ML syrup, Take 5 mLs by mouth 4 (four) times daily as needed., Disp: 118 mL, Rfl: 0   allopurinol (ZYLOPRIM) 100 MG tablet, TAKE 1 TABLET DAILY, Disp: 90 tablet, Rfl: 1   amLODipine (NORVASC) 5 MG tablet, TAKE 1 TABLET (5 MG TOTAL) BY MOUTH DAILY., Disp: 30 tablet, Rfl: 5   aspirin EC 81 MG tablet, Take 81 mg by mouth daily. Swallow whole., Disp: , Rfl:    Azelastine-Fluticasone 137-50 MCG/ACT SUSP, Place 1 spray into the nose every 12 (twelve) hours. (Patient not taking: Reported on 06/22/2022), Disp: 23 g, Rfl: 5   esomeprazole (NEXIUM) 40 MG capsule, TAKE 1 CAPSULE BY MOUTH EVERY DAY, Disp: 90 capsule, Rfl: 1   hydrochlorothiazide (MICROZIDE) 12.5 MG capsule, Take 1 capsule (12.5 mg total) by mouth daily., Disp: 90 capsule, Rfl: 3   HYDROcodone-acetaminophen  (NORCO/VICODIN) 5-325 MG tablet, Take 1 tablet by mouth every 6 (six) hours as needed for severe pain., Disp: 15 tablet, Rfl: 0   methocarbamol (ROBAXIN) 500 MG tablet, Take 1 tablet (500 mg total) by mouth every 8 (eight) hours as needed for muscle spasms., Disp: 10 tablet, Rfl: 0   metoprolol tartrate (LOPRESSOR) 25 MG tablet, Take 0.5 tablets (12.5 mg total) by mouth 2 (two) times daily. (Patient taking differently: Take 25 mg by mouth 2 (two) times daily.), Disp: 90 tablet, Rfl: 3   ondansetron (ZOFRAN) 4 MG tablet, Take 4 mg by mouth 2 (two) times daily., Disp: , Rfl:    OTEZLA 30 MG TABS, Take 1 tablet by mouth 2 (two) times daily., Disp: , Rfl:    rosuvastatin (CRESTOR) 40 MG tablet, TAKE 1 TABLET BY MOUTH EVERY DAY, Disp: 90 tablet, Rfl: 3   Medications ordered in this encounter:  Meds ordered this encounter  Medications   doxycycline (VIBRA-TABS) 100 MG tablet    Sig: Take 1 tablet (100 mg total) by mouth 2 (two) times daily.    Dispense:  20 tablet    Refill:  0    Order Specific Question:   Supervising Provider    Answer:   Merrilee Jansky [[id number]]   predniSONE (DELTASONE) 20 MG tablet    Sig: Take 2 tablets (40 mg total) by mouth daily with breakfast.    Dispense:  10 tablet    Refill:  0    Order Specific Question:   Supervising Provider  Answer:   Merrilee Jansky [[id number]]   benzonatate (TESSALON) 100 MG capsule    Sig: Take 1 capsule (100 mg total) by mouth 3 (three) times daily as needed.    Dispense:  30 capsule    Refill:  0    Order Specific Question:   Supervising Provider    Answer:   Merrilee Jansky [id number]   promethazine-dextromethorphan (PROMETHAZINE-DM) 6.25-15 MG/5ML syrup    Sig: Take 5 mLs by mouth 4 (four) times daily as needed.    Dispense:  118 mL    Refill:  0    Order Specific Question:   Supervising Provider    Answer:   Merrilee Jansky [[id number]]     *If you need refills on other medications prior to your next appointment, please  contact your pharmacy*  Follow-Up: Call back or seek an in-person evaluation if the symptoms worsen or if the condition fails to improve as anticipated.  [facility] [phone]  Other Instructions Upper Respiratory Infection, Adult An upper respiratory infection (URI) is a common viral infection of the nose, throat, and upper air passages that lead to the lungs. The most common type of URI is the common cold. URIs usually get better on their own, without medical treatment. What are the causes? A URI is caused by a virus. You may catch a virus by: Breathing in droplets from an infected person's cough or sneeze. Touching something that has been exposed to the virus (is contaminated) and then touching your mouth, nose, or eyes. What increases the risk? You are more likely to get a URI if: You are very young or very old. You have close contact with others, such as at work, school, or a health care facility. You smoke. You have long-term (chronic) heart or lung disease. You have a weakened disease-fighting system (immune system). You have nasal allergies or asthma. You are experiencing a lot of stress. You have poor nutrition. What are the signs or symptoms? A URI usually involves some of the following symptoms: Runny or stuffy (congested) nose. Cough. Sneezing. Sore throat. Headache. Fatigue. Fever. Loss of appetite. Pain in your forehead, behind your eyes, and over your cheekbones (sinus pain). Muscle aches. Redness or irritation of the eyes. Pressure in the ears or face. How is this diagnosed? This condition may be diagnosed based on your medical history and symptoms, and a physical exam. Your health care provider may use a swab to take a mucus sample from your nose (nasal swab). This sample can be tested to determine what virus is causing the illness. How is this treated? URIs usually get better on their own within 7-10 days. Medicines cannot cure URIs, but  your health care provider may recommend certain medicines to help relieve symptoms, such as: Over-the-counter cold medicines. Cough suppressants. Coughing is a type of defense against infection that helps to clear the respiratory system, so take these medicines only as recommended by your health care provider. Fever-reducing medicines. Follow these instructions at home: Activity Rest as needed. If you have a fever, stay home from work or school until your fever is gone or until your health care provider says your URI cannot spread to other people (is no longer contagious). Your health care provider may have you wear a face mask to prevent your infection from spreading. Relieving symptoms Gargle with a mixture of salt and water 3-4 times a day or as needed. To make salt water, completely dissolve -1 tsp (3-6 g)  of salt in 1 cup (237 mL) of warm water. Use a cool-mist humidifier to add moisture to the air. This can help you breathe more easily. Eating and drinking  Drink enough fluid to keep your urine pale yellow. Eat soups and other clear broths. General instructions  Take over-the-counter and prescription medicines only as told by your health care provider. These include cold medicines, fever reducers, and cough suppressants. Do not use any products that contain nicotine or tobacco. These products include cigarettes, chewing tobacco, and vaping devices, such as e-cigarettes. If you need help quitting, ask your health care provider. Stay away from secondhand smoke. Stay up to date on all immunizations, including the yearly (annual) flu vaccine. Keep all follow-up visits. This is important. How to prevent the spread of infection to others URIs can be contagious. To prevent the infection from spreading: Wash your hands with soap and water for at least 20 seconds. If soap and water are not available, use hand sanitizer. Avoid touching your mouth, face, eyes, or nose. Cough or sneeze into a  tissue or your sleeve or elbow instead of into your hand or into the air.  Contact a health care provider if: You are getting worse instead of better. You have a fever or chills. Your mucus is brown or red. You have yellow or brown discharge coming from your nose. You have pain in your face, especially when you bend forward. You have swollen neck glands. You have pain while swallowing. You have white areas in the back of your throat. Get help right away if: You have shortness of breath that gets worse. You have severe or persistent: Headache. Ear pain. Sinus pain. Chest pain. You have chronic lung disease along with any of the following: Making high-pitched whistling sounds when you breathe, most often when you breathe out (wheezing). Prolonged cough (more than 14 days). Coughing up blood. A change in your usual mucus. You have a stiff neck. You have changes in your: Vision. Hearing. Thinking. Mood. These symptoms may be an emergency. Get help right away. Call 911. Do not wait to see if the symptoms will go away. Do not drive yourself to the hospital. Summary An upper respiratory infection (URI) is a common infection of the nose, throat, and upper air passages that lead to the lungs. A URI is caused by a virus. URIs usually get better on their own within 7-10 days. Medicines cannot cure URIs, but your health care provider may recommend certain medicines to help relieve symptoms. This information is not intended to replace advice given to you by your health care provider. Make sure you discuss any questions you have with your health care provider. Document Revised: 08/19/2020 Document Reviewed: 08/19/2020 Elsevier Patient Education  2024 Elsevier Inc.    If you have been instructed to have an in-person evaluation today at a local Urgent Care facility, please use the link below. It will take you to a list of all of our available [facility], including address,  phone number and hours of operation. Please do not delay care.  [facility]  If you or a family member do not have a primary care provider, use the link below to schedule a visit and establish care. When you choose a [facility] primary care physician or advanced practice provider, you gain a long-term partner in health. Find a Primary Care Provider  Learn more about [facility]'s in-office and virtual care options: [facility] - Get Care Now

## 2022-08-10 ENCOUNTER — Other Ambulatory Visit: Payer: Self-pay | Admitting: Nurse Practitioner

## 2022-08-29 ENCOUNTER — Ambulatory Visit: Payer: Commercial Managed Care - PPO | Admitting: Family Medicine

## 2022-08-29 ENCOUNTER — Encounter: Payer: Self-pay | Admitting: Family Medicine

## 2022-08-29 VITALS — BP 116/68 | HR 56 | Temp 97.5°F | Ht 70.0 in | Wt 205.0 lb

## 2022-08-29 DIAGNOSIS — E7849 Other hyperlipidemia: Secondary | ICD-10-CM | POA: Diagnosis not present

## 2022-08-29 DIAGNOSIS — R7303 Prediabetes: Secondary | ICD-10-CM

## 2022-08-29 DIAGNOSIS — I10 Essential (primary) hypertension: Secondary | ICD-10-CM | POA: Diagnosis not present

## 2022-08-29 MED ORDER — AMLODIPINE BESYLATE 5 MG PO TABS: 5.0000 mg | ORAL_TABLET | Freq: Every day | ORAL | 1 refills | Status: DC

## 2022-08-29 NOTE — Progress Notes (Signed)
   Subjective:    Patient ID: Brian Brewer, male    DOB: 07/24/1965, 57 y.o.   MRN: [id number]  HPI 6 month very nice patient here for follow up HTN, hyperlipidemia refill all medications HTN (hypertension), benign - Plan: Basic Metabolic Panel  Other hyperlipidemia - Plan: Lipid Panel  Prediabetes - Plan: Hemoglobin A1c  History of heart disease with bypass Patient here for follow-up regarding cholesterol.    Patient relates taking medication on a regular basis Denies problems with medication Importance of dietary measures discussed Regular lab work regarding lipid and liver was checked and if needing additional labs was appropriately ordered  Patient for blood pressure check up.  The patient does have hypertension.   Patient relates dietary measures try to minimize salt The importance of healthy diet and activity were discussed Patient relates compliance  Patient has prediabetes portion control regular physical activity minimizing starches and sugars   Review of Systems     Objective:   Physical Exam General-in no acute distress Eyes-no discharge Lungs-respiratory rate normal, CTA CV-no murmurs,RRR Extremities skin warm dry no edema Neuro grossly normal Behavior normal, alert        Assessment & Plan:   Patient had previous appendicitis actually doing very well now no setbacks or problems  1. HTN (hypertension), benign Blood pressure good control continue current measures check lab work - Basic Metabolic Panel  2. Other hyperlipidemia Healthy diet stay on statin - Lipid Panel  3. Prediabetes Portion control minimize starches follow-up again 6 months lab work pending - Hemoglobin A1c

## 2022-08-29 NOTE — Patient Instructions (Signed)

## 2022-11-16 ENCOUNTER — Ambulatory Visit: Payer: Commercial Managed Care - PPO

## 2022-11-16 DIAGNOSIS — Z23 Encounter for immunization: Secondary | ICD-10-CM

## 2022-12-01 ENCOUNTER — Emergency Department (HOSPITAL_COMMUNITY): Payer: Commercial Managed Care - PPO

## 2022-12-01 ENCOUNTER — Other Ambulatory Visit: Payer: Self-pay

## 2022-12-01 ENCOUNTER — Emergency Department (HOSPITAL_COMMUNITY)
Admission: EM | Admit: 2022-12-01 | Discharge: 2022-12-01 | Disposition: A | Discharge status: Home / Self Care | Payer: Commercial Managed Care - PPO | Attending: Emergency Medicine | Admitting: Emergency Medicine

## 2022-12-01 ENCOUNTER — Encounter (HOSPITAL_COMMUNITY): Payer: Self-pay

## 2022-12-01 DIAGNOSIS — Z7982 Long term (current) use of aspirin: Secondary | ICD-10-CM | POA: Insufficient documentation

## 2022-12-01 DIAGNOSIS — R001 Bradycardia, unspecified: Secondary | ICD-10-CM | POA: Insufficient documentation

## 2022-12-01 DIAGNOSIS — Z951 Presence of aortocoronary bypass graft: Secondary | ICD-10-CM | POA: Diagnosis not present

## 2022-12-01 DIAGNOSIS — I10 Essential (primary) hypertension: Secondary | ICD-10-CM | POA: Insufficient documentation

## 2022-12-01 DIAGNOSIS — R1013 Epigastric pain: Secondary | ICD-10-CM

## 2022-12-01 DIAGNOSIS — K279 Peptic ulcer, site unspecified, unspecified as acute or chronic, without hemorrhage or perforation: Secondary | ICD-10-CM | POA: Insufficient documentation

## 2022-12-01 DIAGNOSIS — R079 Chest pain, unspecified: Secondary | ICD-10-CM | POA: Diagnosis not present

## 2022-12-01 DIAGNOSIS — I2510 Atherosclerotic heart disease of native coronary artery without angina pectoris: Secondary | ICD-10-CM | POA: Diagnosis not present

## 2022-12-01 LAB — BASIC METABOLIC PANEL
Anion gap: 7 (ref 5–15)
BUN: 13 mg/dL (ref 6–20)
CO2: 26 mmol/L (ref 22–32)
Calcium: 9.8 mg/dL (ref 8.9–10.3)
Chloride: 103 mmol/L (ref 98–111)
Creatinine, Ser: 1.01 mg/dL (ref 0.61–1.24)
GFR, Estimated: >60 mL/min (ref >60)
Glucose, Bld: 133 mg/dL — ABNORMAL HIGH (ref 70–99)
Potassium: 4.2 mmol/L (ref 3.5–5.1)
Sodium: 136 mmol/L (ref 135–145)

## 2022-12-01 LAB — CBC
HCT: 43.6 % (ref 39.0–52.0)
Hemoglobin: 14.3 g/dL (ref 13.0–17.0)
MCH: 28.5 pg (ref 26.0–34.0)
MCHC: 32.8 g/dL (ref 30.0–36.0)
MCV: 87 fL (ref 80.0–100.0)
Platelets: 231 10*3/uL (ref 150–400)
RBC: 5.01 MIL/uL (ref 4.22–5.81)
RDW: 13.2 % (ref 11.5–15.5)
WBC: 7.8 10*3/uL (ref 4.0–10.5)
nRBC: 0 % (ref 0.0–0.2)

## 2022-12-01 LAB — TROPONIN I (HIGH SENSITIVITY)
Troponin I (High Sensitivity): 12 ng/L (ref <18)
Troponin I (High Sensitivity): 10 ng/L (ref <18)

## 2022-12-01 MED ORDER — ASPIRIN 81 MG PO CHEW
81.0000 mg | CHEWABLE_TABLET | Freq: Once | ORAL | Status: AC
  Administered 2022-12-01: 81 mg via ORAL
  Filled 2022-12-01: qty 1

## 2022-12-01 MED ORDER — SUCRALFATE 1 G PO TABS: 1.0000 g | ORAL_TABLET | Freq: Three times a day (TID) | ORAL | 1 refills | Status: DC

## 2022-12-01 MED ORDER — PANTOPRAZOLE SODIUM 20 MG PO TBEC: 20.0000 mg | DELAYED_RELEASE_TABLET | Freq: Two times a day (BID) | ORAL | 1 refills | Status: DC

## 2022-12-01 MED ORDER — PANTOPRAZOLE SODIUM 40 MG IV SOLR
40.0000 mg | Freq: Once | INTRAVENOUS | Status: AC
  Administered 2022-12-01: 40 mg via INTRAVENOUS
  Filled 2022-12-01: qty 10

## 2022-12-01 NOTE — ED Provider Notes (Signed)
[facility] EMERGENCY DEPARTMENT AT [facility] Provider Note   CSN: [id number] Arrival date & time: 12/01/22  6295     History  Chief Complaint  Patient presents with   Chest Pain    Brian Brewer is a 57 y.o. male.  HPI    57 year old male comes in with chief complaint of chest pain. 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  Patient does not have any stents.  He indicates that he started having epigastric abdominal pain while having coffee at 6 AM.  The pain then radiated up towards the lower part of the chest centrally.  The pain was nonradiating.  Pain was described as burning type pain, typical of GERD but 100 time stronger.  The pain was excruciating and he was unable to take a good breath in.  He was also having associated nausea, sweating.  The symptoms lasted for about 5 minutes and then resolved.  No repeat episodes since then.  Home Medications Prior to Admission medications   Medication Sig Start Date End Date Taking? Authorizing Provider  allopurinol (ZYLOPRIM) 100 MG tablet TAKE 1 TABLET DAILY 06/29/22  Yes Luking, Scott A, MD  amLODipine (NORVASC) 5 MG tablet Take 1 tablet (5 mg total) by mouth daily. 08/29/22  Yes Babs Sciara, MD  aspirin EC 81 MG tablet Take 81 mg by mouth daily. Swallow whole.   Yes [provider]  hydrochlorothiazide (MICROZIDE) 12.5 MG capsule Take 1 capsule (12.5 mg total) by mouth daily. 02/18/22  Yes Monge, Petra Kuba, NP  metoprolol tartrate (LOPRESSOR) 25 MG tablet TAKE 1 TABLET BY MOUTH TWICE A DAY 08/10/22  Yes Monge, Emily C, NP  OTEZLA 30 MG TABS Take 1 tablet by mouth 2 (two) times daily.   Yes [provider]  pantoprazole (PROTONIX) 20 MG tablet Take 1 tablet (20 mg total) by mouth 2 (two) times daily before a meal. 12/01/22  Yes Chantilly Linskey, MD  rosuvastatin (CRESTOR) 40 MG tablet TAKE 1 TABLET BY MOUTH EVERY  DAY 07/04/22  Yes Lennette Bihari, MD  sucralfate (CARAFATE) 1 g tablet Take 1 tablet (1 g total) by mouth 4 (four) times daily -  with meals and at bedtime. 12/01/22  Yes Derwood Kaplan, MD      Allergies    Amoxicillin-pot clavulanate, Levofloxacin, Zithromax [azithromycin], Chantix [varenicline tartrate], and Oxycodone    Review of Systems   Review of Systems  All other systems reviewed and are negative.   Physical Exam Updated Vital Signs BP 121/74   Pulse (!) 52   Temp 97.8 F (36.6 C) (Oral)   Resp 16   Ht 5\' 10"  (1.778 m)   Wt 93 kg   SpO2 100%   BMI 29.41 kg/m  Physical Exam Vitals and nursing note reviewed.  Constitutional:      Appearance: He is well-developed.  HENT:     Head: Atraumatic.  Cardiovascular:     Rate and Rhythm: Bradycardia present.     Heart sounds: Normal heart sounds.  Pulmonary:     Effort: Pulmonary effort is normal.  Musculoskeletal:     Cervical back: Neck supple.  Skin:    General: Skin is warm.  Neurological:     Mental Status: He is alert and oriented to person, place, and time.     ED Results / Procedures / Treatments   Labs (all labs ordered are listed, but only abnormal  results are displayed) Labs Reviewed  BASIC METABOLIC PANEL - Abnormal; Notable for the following components:      Result Value   Glucose, Bld 133 (*)    All other components within normal limits  CBC  TROPONIN I (HIGH SENSITIVITY)  TROPONIN I (HIGH SENSITIVITY)    EKG EKG Interpretation Date/Time:  Thursday December 01 2022 07:42:01 EDT Ventricular Rate:  50 PR Interval:  164 QRS Duration:  92 QT Interval:  428 QTC Calculation: 390 R Axis:   39  Text Interpretation: Sinus bradycardia ST elevation in the inferior leads and lateral leads Abnormal ECG When compared with ECG of 18-Mar-2021 07:25, PREVIOUS ECG IS PRESENT new changes Confirmed by Derwood Kaplan ([id number]) on 12/01/2022 7:58:05 AM    EKG Interpretation Date/Time:  Thursday December 01 2022 08:08:48 EDT Ventricular Rate:  51 PR Interval:  184 QRS Duration:  102 QT Interval:  444 QTC Calculation: 409 R Axis:   48  Text Interpretation: Sinus rhythm Inferior infarct, acute (RCA) Minimal ST elevation, anterior leads Probable RV involvement, suggest recording right precordial leads inferior and anterior ST elevation persists, but no dynamic changes noted Confirmed by Derwood Kaplan ([id number]) on 12/01/2022 8:30:33 AM        Radiology DG Chest 2 View  Result Date: 12/01/2022 CLINICAL DATA:  Chest pain.  Shortness of breath. EXAM: CHEST - 2 VIEW COMPARISON:  03/29/2021. FINDINGS: Bilateral lung fields are clear. Bilateral costophrenic angles are clear. Normal cardio-mediastinal silhouette. There are surgical staples along the heart border and sternotomy wires, status post CABG (coronary artery bypass graft). No acute osseous abnormalities. The soft tissues are within normal limits. IMPRESSION: *No active cardiopulmonary disease. Electronically Signed   By: Jules Schick M.D.   On: 12/01/2022 08:37    Procedures Procedures    Medications Ordered in ED Medications  aspirin chewable tablet 81 mg (81 mg Oral Given 12/01/22 0837)  pantoprazole (PROTONIX) injection 40 mg (40 mg Intravenous Given 12/01/22 0981)    ED Course/ Medical Decision Making/ A&P                                 Medical Decision Making Amount and/or Complexity of Data Reviewed Labs: ordered. Radiology: ordered.  Risk OTC drugs. Prescription drug management.   This patient presents to the ED with chief complaint(s) of chest pain, epigastric with pertinent past medical history of CAD status post CABG, GERD.The complaint involves an extensive differential diagnosis and also carries with it a high risk of complications and morbidity.    The differential diagnosis considered for this patient includes  ACS syndrome Aortic dissection CHF exacerbation Valvular  disorder Myocarditis Pericarditis Endocarditis Pericardial effusion / tamponade Pneumonia Pleural effusion / Pulmonary edema PE Pneumothorax Musculoskeletal pain PUD / Gastritis / Esophagitis Esophageal spasm  The initial plan is to get basic labs including delta troponin. Patient Is done about a year ago, which is reassuring.  He does not have any stents.  The pain was epigastric, lasted 5 minutes, was severe and has resolved.  If we get delta troponin they are negative, MI can be ruled out.  Of note, patient is EKG does show ST elevations.  We have repeated the EKG and it shows no dynamic changes.  The ST elevations are likely early repolarization and also likely because of chronic changes.   Additional history obtained: Additional history obtained from spouse Records reviewed previous admission documents and previous cardiology notes  Independent labs interpretation:  The following labs were independently interpreted: Initial high-sensitivity troponin is normal.  White count is normal.  Independent visualization and interpretation of imaging: - I independently visualized the following imaging with scope of interpretation limited to determining acute life threatening conditions related to emergency care: X-ray of the chest, which revealed no evidence of free air, pneumothorax  Treatment and Reassessment: Patient reassessed. Pt is comfortable at this time.  No repeat chest pain. Results of the workup discussed. Strict ER return precautions discussed. Follow up instruction discussed, and pt agrees with the plan and is comfortable with it.    Final orders clinical Impression(s) / ED Diagnoses Final diagnoses:  PUD (peptic ulcer disease)  Epigastric pain    Rx / DC Orders ED Discharge Orders          Ordered    pantoprazole (PROTONIX) 20 MG tablet  2 times daily before meals        12/01/22 1137    sucralfate (CARAFATE) 1 g tablet  3 times daily with meals & bedtime         12/01/22 1137              Derwood Kaplan, MD 12/01/22 1143

## 2022-12-01 NOTE — ED Triage Notes (Signed)
Pt reports left sided chest pain, upper abdomen, lasting approx . Pt reports light headed, nauseous and diaphoretic with the pain. No recent fever. VSS. Pt reports took 81mg  ASA per daily medication regimen. Hx of CABG

## 2022-12-01 NOTE — Discharge Instructions (Signed)
We saw you in the ER for the epigastric chest pain. All of our cardiac workup is normal, including labs, EKG and chest X-RAY are normal. We are not sure what is causing your discomfort, but we feel comfortable sending you home at this time. The workup in the ER is not complete, and you should follow up with your primary care doctor for further evaluation.

## 2022-12-24 ENCOUNTER — Other Ambulatory Visit: Payer: Self-pay | Admitting: Family Medicine

## 2023-01-14 ENCOUNTER — Other Ambulatory Visit: Payer: Self-pay | Admitting: Family Medicine

## 2023-01-16 ENCOUNTER — Telehealth: Payer: Self-pay | Admitting: Family Medicine

## 2023-01-16 MED ORDER — ESOMEPRAZOLE MAGNESIUM 40 MG PO CPDR: 40.0000 mg | DELAYED_RELEASE_CAPSULE | Freq: Every day | ORAL | 3 refills | Status: DC

## 2023-01-16 NOTE — Telephone Encounter (Signed)
Copied from CRM [id number]. Topic: Clinical - Prescription Issue >> Jan 16, 2023 10:06 AM Sasha H wrote: Reason for CRM: Pt states he would like Nexium instead of pantoprazole (PROTONIX) 20 MG tablet

## 2023-02-04 ENCOUNTER — Other Ambulatory Visit: Payer: Self-pay | Admitting: Nurse Practitioner

## 2023-02-24 ENCOUNTER — Other Ambulatory Visit: Payer: Self-pay | Admitting: Family Medicine

## 2023-02-24 DIAGNOSIS — I10 Essential (primary) hypertension: Secondary | ICD-10-CM

## 2023-03-01 ENCOUNTER — Ambulatory Visit (INDEPENDENT_AMBULATORY_CARE_PROVIDER_SITE_OTHER): Payer: Commercial Managed Care - PPO | Admitting: Family Medicine

## 2023-03-01 VITALS — BP 131/74 | HR 64 | Temp 98.1°F | Ht 70.0 in | Wt 212.2 lb

## 2023-03-01 DIAGNOSIS — Z0001 Encounter for general adult medical examination with abnormal findings: Secondary | ICD-10-CM | POA: Diagnosis not present

## 2023-03-01 DIAGNOSIS — Z951 Presence of aortocoronary bypass graft: Secondary | ICD-10-CM

## 2023-03-01 DIAGNOSIS — L4050 Arthropathic psoriasis, unspecified: Secondary | ICD-10-CM | POA: Diagnosis not present

## 2023-03-01 DIAGNOSIS — Z125 Encounter for screening for malignant neoplasm of prostate: Secondary | ICD-10-CM

## 2023-03-01 DIAGNOSIS — E7849 Other hyperlipidemia: Secondary | ICD-10-CM | POA: Diagnosis not present

## 2023-03-01 DIAGNOSIS — Z0000 Encounter for general adult medical examination without abnormal findings: Secondary | ICD-10-CM

## 2023-03-01 DIAGNOSIS — I10 Essential (primary) hypertension: Secondary | ICD-10-CM | POA: Diagnosis not present

## 2023-03-01 DIAGNOSIS — L03012 Cellulitis of left finger: Secondary | ICD-10-CM

## 2023-03-01 DIAGNOSIS — Z79899 Other long term (current) drug therapy: Secondary | ICD-10-CM

## 2023-03-01 DIAGNOSIS — R7303 Prediabetes: Secondary | ICD-10-CM

## 2023-03-01 NOTE — Progress Notes (Signed)
   Subjective:    Patient ID: Brian Brewer, male    DOB: Nov 24, 1965, 58 y.o.   MRN: [id number]  HPI Very nice patient Here today because of follow-up Has history of heart disease Also history of hypertension hyperlipidemia Patient is due for lab work The patient comes in today for a wellness visit.    A review of their health history was completed.  A review of medications was also completed.  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   Additional concerns: No other particular concerns  HTN (hypertension), benign - Plan: Basic Metabolic Panel, Microalbumin/Creatinine Ratio, Urine  Psoriatic arthritis (HCC)  Other hyperlipidemia - Plan: Lipid Panel  Hx of CABG  Paronychia of finger of left hand  Prediabetes - Plan: Hemoglobin A1c  Screening for prostate cancer - Plan: PSA  High risk medication use - Plan: Hepatic Function Panel     Review of Systems     Objective:   Physical Exam  General-in no acute distress Eyes-no discharge Lungs-respiratory rate normal, CTA CV-no murmurs,RRR Extremities skin warm dry no edema Neuro grossly normal Behavior normal, alert Prostate normal size for age Soft No nodules       Assessment & Plan:   1. Well adult exam (Primary) Adult wellness-complete.wellness physical was conducted today. Importance of diet and exercise were discussed in detail.  Importance of stress reduction and healthy living were discussed.  In addition to this a discussion regarding safety was also covered.  We also reviewed over immunizations and gave recommendations regarding current immunization needed for age.   In  addition to this additional areas were also touched on including: Preventative health exams needed:  Colonoscopy up-to-date  Patient was advised yearly wellness exam   2. HTN (hypertension), benign Blood pressure good control check lab work - Basic Metabolic Panel - Microalbumin/Creatinine Ratio, Urine  3. Psoriatic arthritis (HCC) On [place] doing well  4. Other hyperlipidemia Goal is to keep LDL below 55 - Lipid Panel  5. Hx of CABG Denies any chest tightness pressure pain or cardiac symptoms staying away from smoking  6. Paronychia of finger of left hand Has small area on left finger patient defers on antibiotics he will do warm compresses  7. Prediabetes Check A1c watch diet stay healthy and active - Hemoglobin A1c  8. Screening for prostate cancer Lab ordered - PSA  9. High risk medication use Lab ordered - Hepatic Function Panel

## 2023-03-02 ENCOUNTER — Encounter: Payer: Self-pay | Admitting: Family Medicine

## 2023-03-02 LAB — BASIC METABOLIC PANEL
BUN/Creatinine Ratio: 14 (ref 9–20)
BUN: 15 mg/dL (ref 6–24)
CO2: 22 mmol/L (ref 20–29)
Calcium: 10.1 mg/dL (ref 8.7–10.2)
Chloride: 103 mmol/L (ref 96–106)
Creatinine, Ser: 1.06 mg/dL (ref 0.76–1.27)
Glucose: 84 mg/dL (ref 70–99)
Potassium: 4.5 mmol/L (ref 3.5–5.2)
Sodium: 144 mmol/L (ref 134–144)
eGFR: 82 mL/min/{1.73_m2} (ref >59)

## 2023-03-02 LAB — LIPID PANEL
Chol/HDL Ratio: 3.4 {ratio} (ref 0.0–5.0)
Cholesterol, Total: 99 mg/dL — ABNORMAL LOW (ref 100–199)
HDL: 29 mg/dL — ABNORMAL LOW (ref >39)
LDL Chol Calc (NIH): 48 mg/dL (ref 0–99)
Triglycerides: 122 mg/dL (ref 0–149)
VLDL Cholesterol Cal: 22 mg/dL (ref 5–40)

## 2023-03-02 LAB — HEPATIC FUNCTION PANEL
ALT: 25 [IU]/L (ref 0–44)
AST: 19 [IU]/L (ref 0–40)
Albumin: 4.7 g/dL (ref 3.8–4.9)
Alkaline Phosphatase: 70 [IU]/L (ref 44–121)
Bilirubin Total: 0.3 mg/dL (ref 0.0–1.2)
Bilirubin, Direct: 0.12 mg/dL (ref 0.00–0.40)
Total Protein: 7.3 g/dL (ref 6.0–8.5)

## 2023-03-02 LAB — MICROALBUMIN / CREATININE URINE RATIO
Creatinine, Urine: 54 mg/dL
Microalb/Creat Ratio: <6 mg/g{creat} (ref 0–29)
Microalbumin, Urine: <3 ug/mL

## 2023-03-02 LAB — PSA: Prostate Specific Ag, Serum: 0.7 ng/mL (ref 0.0–4.0)

## 2023-03-02 LAB — HEMOGLOBIN A1C
Est. average glucose Bld gHb Est-mCnc: 131 mg/dL
Hgb A1c MFr Bld: 6.2 % — ABNORMAL HIGH (ref 4.8–5.6)

## 2023-03-11 ENCOUNTER — Other Ambulatory Visit: Payer: Self-pay | Admitting: Nurse Practitioner

## 2023-04-16 ENCOUNTER — Other Ambulatory Visit: Payer: Self-pay | Admitting: Family Medicine

## 2023-04-17 ENCOUNTER — Other Ambulatory Visit: Payer: Self-pay

## 2023-04-17 ENCOUNTER — Telehealth: Payer: Self-pay | Admitting: Family Medicine

## 2023-04-17 ENCOUNTER — Other Ambulatory Visit: Payer: Self-pay | Admitting: Family Medicine

## 2023-04-17 MED ORDER — ESOMEPRAZOLE MAGNESIUM 40 MG PO CPDR: 40.0000 mg | DELAYED_RELEASE_CAPSULE | Freq: Every day | ORAL | 3 refills | Status: DC

## 2023-04-17 NOTE — Telephone Encounter (Signed)
 Copied from CRM [id number]. Topic: Clinical - Prescription Issue >> Apr 17, 2023  9:56 AM Priscille Loveless wrote: Reason for CRM:   esomeprazole (NEXIUM) 40 MG capsule this should of been sent to   [id number] - [place], [place] - [address]  Phone: [phone] Fax: [phone]  Please send over to the correct pharmacy.

## 2023-05-12 ENCOUNTER — Other Ambulatory Visit: Payer: Self-pay | Admitting: Cardiovascular Disease

## 2023-05-29 ENCOUNTER — Other Ambulatory Visit: Payer: Self-pay | Admitting: Cardiovascular Disease

## 2023-05-30 ENCOUNTER — Telehealth: Payer: Self-pay | Admitting: Cardiovascular Disease

## 2023-05-30 NOTE — Telephone Encounter (Signed)
*  STAT* If patient is at the pharmacy, call can be transferred to refill team.   1. Which medications need to be refilled? (please list name of each medication and dose if known) rosuvastatin  (CRESTOR ) 40 MG tablet    2. Would you like to learn more about the convenience, safety, & potential cost savings by using the [facility]?    3. Are you open to using the [facility] (Type [facility].  ).   4. Which pharmacy/location (including street and city if local pharmacy) is medication to be sent to?  [id number] - [place], [place] - [address]     5. Do they need a 30 day or 90 day supply? 90 day

## 2023-05-31 ENCOUNTER — Other Ambulatory Visit: Payer: Self-pay | Admitting: Nurse Practitioner

## 2023-05-31 ENCOUNTER — Other Ambulatory Visit: Payer: Self-pay | Admitting: Family Medicine

## 2023-05-31 DIAGNOSIS — I10 Essential (primary) hypertension: Secondary | ICD-10-CM

## 2023-05-31 MED ORDER — ROSUVASTATIN CALCIUM 40 MG PO TABS: 40.0000 mg | ORAL_TABLET | Freq: Every day | ORAL | 0 refills | Status: DC

## 2023-05-31 NOTE — Telephone Encounter (Signed)
 Pt's medication was sent to pt's pharmacy as requested. Confirmation received.

## 2023-06-16 ENCOUNTER — Other Ambulatory Visit: Payer: Self-pay | Admitting: Family Medicine

## 2023-07-15 ENCOUNTER — Other Ambulatory Visit: Payer: Self-pay | Admitting: Family Medicine

## 2023-07-17 ENCOUNTER — Other Ambulatory Visit: Payer: Self-pay

## 2023-07-17 MED ORDER — ESOMEPRAZOLE MAGNESIUM 40 MG PO CPDR: 40.0000 mg | DELAYED_RELEASE_CAPSULE | Freq: Every day | ORAL | 3 refills | Status: DC

## 2023-08-28 ENCOUNTER — Other Ambulatory Visit: Payer: Self-pay | Admitting: Nurse Practitioner

## 2023-08-29 ENCOUNTER — Other Ambulatory Visit: Payer: Self-pay

## 2023-08-29 ENCOUNTER — Other Ambulatory Visit: Payer: Self-pay | Admitting: Family Medicine

## 2023-08-29 MED ORDER — ALLOPURINOL 100 MG PO TABS: 100.0000 mg | ORAL_TABLET | Freq: Every day | ORAL | 0 refills | Status: DC

## 2023-08-29 MED ORDER — HYDROCHLOROTHIAZIDE 12.5 MG PO CAPS: 12.5000 mg | ORAL_CAPSULE | Freq: Every day | ORAL | 0 refills | Status: DC

## 2023-08-31 ENCOUNTER — Telehealth: Payer: Self-pay | Admitting: Cardiovascular Disease

## 2023-08-31 MED ORDER — ROSUVASTATIN CALCIUM 40 MG PO TABS: 40.0000 mg | ORAL_TABLET | Freq: Every day | ORAL | 0 refills | Status: DC

## 2023-08-31 NOTE — Telephone Encounter (Signed)
 Refills has been sent to the pharmacy. Patient must keep August 13th appointment in order to receive a 90 day supply. Thanks.

## 2023-08-31 NOTE — Telephone Encounter (Signed)
*  STAT* If patient is at the pharmacy, call can be transferred to refill team.   1. Which medications need to be refilled? (please list name of each medication and dose if known)  rosuvastatin  (CRESTOR ) 40 MG tablet Take 1 tablet (40 mg total) by mouth daily.     2. Which pharmacy/location (including street and city if local pharmacy) is medication to be sent to?  [id number] - , [place] - [address]    3. Do they need a 30 day or 90 day supply? 90   Patient is out of medication and leaving to go out of town in the morning. Has appt on 8/13

## 2023-09-01 NOTE — Progress Notes (Signed)
 Cardiology Office Note:    Date:  09/13/2023   ID:  Brian Brewer, DOB Apr 02, 1965, MRN [id number]  PCP:  Alphonsa Glendia LABOR, MD  Cardiologist:  Alm Clay, MD Cardiology APP:  Genova Kiner E, PA-C     Referring MD: Alphonsa Glendia LABOR, MD   Chief Complaint: follow-up of CAD  History of Present Illness:    Brian Brewer is a 58 y.o. male with a history of CAD s/p CABG x3 (LIMA-LAD, SVG-PDA, SVG-Diag) in 03/2021, carotid artery disease, hypertension, hyperlipidemia, GERD, gout, psoriatic arthritis, and former tobacco abuse who presents today for routine follow-up.  Patient was admitted in 03/2021 for NSTEMI.  Echo during that admission showed LVEF of 55-60% with hypokinesis of the apical septal segment and apex and moderate LVH, normal RV function, and mild MR. LHC showed multivessel disease and he underwent CABG x 3 with LIMA to LAD, SVG to PDA, and SVG to Diagonal. Pre-CABG Dopplers showed mild stenosis (1-39%) of bilateral ICAs.  Postop course was unremarkable. He has subsequently done well from a cardiac standpoint.  He was last seen by Dr. Burnard in 06/2022 at which time he was doing well with no recurrent chest pain or shortness of breath.  She was seen in the ED in 11/2022 for epigastric burning pain that radiated up to his chest. He stated it felt similar to GERD but 100 times stronger. EKG showed diffuse ST elevations. High-sensitivity troponin negative x2. Symptoms were felt to be GI in nature and he was felt to be stable for discharge from the ED with prescriptions of Protonix  and Carafate .  Patient presents today for follow-up.  He states he is doing well.  We briefly discussed the ED visit in 11/2022.  He states it was really epigastric pain not chest pain that he was having about time.  He has not had any recurrence has been treated with Protonix  and Carafate .  No chest pain.  No shortness of breath, orthopnea, PND, edema, palpitations, lightheadedness/dizziness, syncope.  He works  for an [employer] and is frequently cutting down trees and has no exertional symptoms with this.  EKGs/Labs/Other Studies Reviewed:    The following studies were reviewed:  Echocardiogram 03/16/2021: Impressions: 1. Left ventricular ejection fraction, by estimation, is 55 to 60%. The  left ventricle has normal function. The left ventricle demonstrates  regional wall motion abnormalities (see scoring diagram/findings for  description). There is moderate concentric  left ventricular hypertrophy. Left ventricular diastolic parameters were  normal.   2. Right ventricular systolic function is normal. The right ventricular  size is normal. Tricuspid regurgitation signal is inadequate for assessing  PA pressure.   3. The mitral valve is grossly normal. Mild mitral valve regurgitation.  No evidence of mitral stenosis.   4. The aortic valve is tricuspid. Aortic valve regurgitation is not  visualized. No aortic stenosis is present.   5. The inferior vena cava is normal in size with greater than 50%  respiratory variability, suggesting right atrial pressure of 3 mmHg.  _______________  Left Cardiac Catheterization 03/16/2021: Conclusions: Ostial to proximal LAD 95% stenosis before the origin of diagonal #1.  Chronic total occlusion of the LAD after the first diagonal.  First diagonal contains segmental 50% narrowing.   Widely patent left main. 40 to 50% second obtuse marginal. Dominant RCA with 60-70 % eccentric proximal stenosis.  The vessel is dominant and supplies right to left collaterals to LAD.  The LAD appears to be a large target that  is graftable. Recommendations: Revascularization is needed.  Best long-term approach would be LIMA to LAD and saphenous vein graft to diagonal plus minus RCA.  There could potentially be a percutaneous approach although it would include a chronic total occlusion bifurcation stenosis in the proximal to mid LAD. Recommend surgical evaluation and  interventional opinions from team.  Diagnostic Dominance: Right  _______________  Pre-CABG Dopplers 03/16/2021: Summary:  - Right Carotid: Velocities in the right ICA are consistent with a 1-39% stenosis.  - Left Carotid: Velocities in the left ICA are consistent with a 1-39% stenosis.  - Vertebrals: Bilateral vertebral arteries demonstrate antegrade flow.   - Right Upper Extremity: Unable to obtain rt allens test due to TR band.  - Left Upper Extremity: Doppler waveforms remain within normal limits with left radial compression. Doppler waveforms decrease 50% with left ulnar compression.     EKG:  EKG ordered today.   EKG Interpretation Date/Time:  Wednesday September 13 2023 08:24:19 EDT Ventricular Rate:  55 PR Interval:  188 QRS Duration:  94 QT Interval:  408 QTC Calculation: 390 R Axis:   53  Text Interpretation: Sinus bradycardia Isolated T wave inversions in leads aVL Cannot rule out Anterior infarct , age undetermined When compared with ECG of 01-Dec-2022 08:08, No significant change was found Confirmed by Jadine Patient [id number]) on 09/13/2023 8:38:04 AM    Recent Labs: 12/01/2022: Hemoglobin 14.3; Platelets 231 03/01/2023: ALT 25; BUN 15; Creatinine, Ser 1.06; Potassium 4.5; Sodium 144  Recent Lipid Panel    Component Value Date/Time   CHOL 99 (L) 03/01/2023 0924   TRIG 122 03/01/2023 0924   HDL 29 (L) 03/01/2023 0924   CHOLHDL 3.4 03/01/2023 0924   CHOLHDL 3.9 03/17/2021 0537   VLDL 37 03/17/2021 0537   LDLCALC 48 03/01/2023 0924    Physical Exam:    Vital Signs: BP 120/70   Pulse (!) 55   Ht 5' 10 (1.778 m)   Wt 216 lb 12.8 oz (98.3 kg)   SpO2 96%   BMI 31.11 kg/m     Wt Readings from Last 3 Encounters:  09/13/23 216 lb 12.8 oz (98.3 kg)  03/01/23 212 lb 3.2 oz (96.3 kg)  12/01/22 205 lb (93 kg)     General: 58 y.o. male in no acute distress. Neck: Supple. No carotid bruits. No JVD. Heart: Mildly bradycardic with normal rhythm. Distinct S1 and S2.  No murmurs, gallops, or rubs.  Lungs: No increased work of breathing. Clear to ausculation bilaterally. No wheezes, rhonchi, or rales.  Extremities: No lower extremity edema.   Skin: Warm and dry. Neuro: No focal deficits. Psych: Normal affect. Responds appropriately.  Assessment:    1. Coronary artery disease involving native coronary artery of native heart without angina pectoris   2. S/P CABG (coronary artery bypass graft)   3. Bilateral carotid artery stenosis   4. Mild mitral regurgitation   5. Primary hypertension   6. Hyperlipidemia, unspecified hyperlipidemia type     Plan:    CAD s/p CABG History of NSTEMI in 03/2021 s/p CABG x3. - Doing well. No chest pain.  - Continue Aspirin  81mg  daily, Lopressor  25mg  twice daily, and Crestor  40mg  daily.  Carotid Artery Disease Pre-CABG dopplers in 03/2021 showed 1-39% stenosis of bilateral ICAs.  - Continue aspirin  and statin above. - Will repeat carotid dopplers when he returns for Echo in 03/2024.  Mild Mitral Regurgitation Noted on Echo in 03/2021. - Will repeat Echo in 03/2024 for routine monitoring of this.  Hypertension BP well controlled.  - Continue current medications: Amlodipine  5mg  daily, Lopressor  25mg  twice daily, and HCTZ 12.5mg  daily.  Hyperlipidemia Lipid panel in 02/2023: Total Cholesterol 99, Triglycerides 122, HDL 29, LDL 48.  - Continue Crestor  40mg  daily. - Labs followed by PCP.  Disposition: Follow up in 1 year. He was previously followed by Dr. Burnard who has now retire, so will have him establish with Dr. Anner at next visit.   Signed, Aline FORBES Door, PA-C  09/13/2023 8:59 AM    [facility]

## 2023-09-13 ENCOUNTER — Ambulatory Visit: Attending: Student | Admitting: Student

## 2023-09-13 ENCOUNTER — Encounter: Payer: Self-pay | Admitting: Student

## 2023-09-13 VITALS — BP 120/70 | HR 55 | Ht 70.0 in | Wt 216.8 lb

## 2023-09-13 DIAGNOSIS — I6523 Occlusion and stenosis of bilateral carotid arteries: Secondary | ICD-10-CM | POA: Diagnosis not present

## 2023-09-13 DIAGNOSIS — I10 Essential (primary) hypertension: Secondary | ICD-10-CM

## 2023-09-13 DIAGNOSIS — Z951 Presence of aortocoronary bypass graft: Secondary | ICD-10-CM

## 2023-09-13 DIAGNOSIS — I2510 Atherosclerotic heart disease of native coronary artery without angina pectoris: Secondary | ICD-10-CM

## 2023-09-13 DIAGNOSIS — E785 Hyperlipidemia, unspecified: Secondary | ICD-10-CM

## 2023-09-13 DIAGNOSIS — I340 Nonrheumatic mitral (valve) insufficiency: Secondary | ICD-10-CM | POA: Diagnosis not present

## 2023-09-13 MED ORDER — HYDROCHLOROTHIAZIDE 12.5 MG PO CAPS: 12.5000 mg | ORAL_CAPSULE | Freq: Every day | ORAL | 3 refills | Status: AC

## 2023-09-13 MED ORDER — ROSUVASTATIN CALCIUM 40 MG PO TABS: 40.0000 mg | ORAL_TABLET | Freq: Every day | ORAL | 3 refills | Status: AC

## 2023-09-13 MED ORDER — AMLODIPINE BESYLATE 5 MG PO TABS: 5.0000 mg | ORAL_TABLET | Freq: Every day | ORAL | 3 refills | Status: AC

## 2023-09-13 MED ORDER — METOPROLOL TARTRATE 25 MG PO TABS: 25.0000 mg | ORAL_TABLET | Freq: Two times a day (BID) | ORAL | 3 refills | Status: AC

## 2023-09-13 NOTE — Patient Instructions (Signed)
 Medication Instructions:  Your physician recommends that you continue on your current medications as directed. Please refer to the Current Medication list given to you today.  *If you need a refill on your cardiac medications before your next appointment, please call your pharmacy*  Lab Work: NONE If you have labs (blood work) drawn today and your tests are completely normal, you will receive your results only by: MyChart Message (if you have MyChart) OR A paper copy in the mail If you have any lab test that is abnormal or we need to change your treatment, we will call you to review the results.  Testing/Procedures: Your physician has requested that you have an echocardiogram. Echocardiography is a painless test that uses sound waves to create images of your heart. It provides your doctor with information about the size and shape of your heart and how well your heart's chambers and valves are working. This procedure takes approximately one hour. There are no restrictions for this procedure. TO BE DONE IN FEB. 2026 Please do NOT wear cologne, perfume, aftershave, or lotions (deodorant is allowed). Please arrive 15 minutes prior to your appointment time.  Please note: We ask at that you not bring children with you during ultrasound (echo/ vascular) testing. Due to room size and safety concerns, children are not allowed in the ultrasound rooms during exams. Our front office staff cannot provide observation of children in our lobby area while testing is being conducted. An adult accompanying a patient to their appointment will only be allowed in the ultrasound room at the discretion of the ultrasound technician under special circumstances. We apologize for any inconvenience.   Your physician has requested that you have a carotid duplex. This test is an ultrasound of the carotid arteries in your neck. It looks at blood flow through these arteries that supply the brain with blood. Allow one hour for this  exam. There are no restrictions or special instructions. TO BE DONE IN FEB 2026.  Follow-Up: At [facility], you and your health needs are our priority.  As part of our continuing mission to provide you with exceptional heart care, our providers are all part of one team.  This team includes your primary Cardiologist (physician) and Advanced Practice Providers or APPs (Physician Assistants and Nurse Practitioners) who all work together to provide you with the care you need, when you need it.  Your next appointment:   1 year(s)  Provider:   DR. HARDING

## 2023-11-16 ENCOUNTER — Ambulatory Visit

## 2023-11-16 DIAGNOSIS — Z23 Encounter for immunization: Secondary | ICD-10-CM | POA: Diagnosis not present

## 2023-12-31 ENCOUNTER — Other Ambulatory Visit: Payer: Self-pay | Admitting: Family Medicine

## 2024-01-15 ENCOUNTER — Encounter: Payer: Self-pay | Admitting: Family Medicine

## 2024-03-11 ENCOUNTER — Other Ambulatory Visit (HOSPITAL_COMMUNITY)

## 2024-03-11 ENCOUNTER — Inpatient Hospital Stay (HOSPITAL_COMMUNITY): Admission: RE | Admit: 2024-03-11 | Source: Ambulatory Visit

## 2024-03-11 ENCOUNTER — Ambulatory Visit (HOSPITAL_COMMUNITY)
